# Patient Record
Sex: Female | Born: 1985 | Race: White | Hispanic: No | Marital: Single | Smoking: Current every day smoker
Health system: Southern US, Community
[De-identification: ages and names within clinical notes are randomized; demographics above are authoritative.]

## PROBLEM LIST (undated history)

## (undated) DIAGNOSIS — R519 Headache, unspecified: Secondary | ICD-10-CM

## (undated) DIAGNOSIS — F419 Anxiety disorder, unspecified: Secondary | ICD-10-CM

## (undated) DIAGNOSIS — F319 Bipolar disorder, unspecified: Secondary | ICD-10-CM

## (undated) DIAGNOSIS — R569 Unspecified convulsions: Secondary | ICD-10-CM

## (undated) DIAGNOSIS — F4310 Post-traumatic stress disorder, unspecified: Secondary | ICD-10-CM

## (undated) DIAGNOSIS — M797 Fibromyalgia: Secondary | ICD-10-CM

## (undated) DIAGNOSIS — R51 Headache: Secondary | ICD-10-CM

## (undated) DIAGNOSIS — M4850XA Collapsed vertebra, not elsewhere classified, site unspecified, initial encounter for fracture: Secondary | ICD-10-CM

## (undated) DIAGNOSIS — J45909 Unspecified asthma, uncomplicated: Secondary | ICD-10-CM

## (undated) HISTORY — PX: HAND SURGERY: SHX662

---

## 2008-08-14 ENCOUNTER — Emergency Department: Payer: Self-pay | Admitting: Emergency Medicine

## 2009-01-05 ENCOUNTER — Observation Stay: Payer: Self-pay | Admitting: Obstetrics and Gynecology

## 2009-06-17 ENCOUNTER — Emergency Department: Payer: Self-pay | Admitting: Emergency Medicine

## 2009-07-01 ENCOUNTER — Emergency Department: Payer: Self-pay | Admitting: Emergency Medicine

## 2009-10-16 ENCOUNTER — Emergency Department: Payer: Self-pay | Admitting: Emergency Medicine

## 2011-11-08 ENCOUNTER — Emergency Department: Payer: Self-pay | Admitting: Emergency Medicine

## 2011-11-08 LAB — URINALYSIS, COMPLETE
Nitrite: NEGATIVE
Ph: 5 (ref 4.5–8.0)
Protein: 30
RBC,UR: 28 /HPF (ref 0–5)

## 2011-11-08 LAB — BASIC METABOLIC PANEL
Anion Gap: 9 (ref 7–16)
BUN: 11 mg/dL (ref 7–18)
Chloride: 104 mmol/L (ref 98–107)
Co2: 25 mmol/L (ref 21–32)
Creatinine: 0.69 mg/dL (ref 0.60–1.30)
Glucose: 86 mg/dL (ref 65–99)
Osmolality: 274 (ref 275–301)
Potassium: 3.6 mmol/L (ref 3.5–5.1)

## 2011-11-08 LAB — CBC WITH DIFFERENTIAL/PLATELET
Basophil #: 0 10*3/uL (ref 0.0–0.1)
Basophil %: 0.4 %
Eosinophil #: 0.8 10*3/uL — ABNORMAL HIGH (ref 0.0–0.7)
Eosinophil %: 7.4 %
HGB: 12.1 g/dL (ref 12.0–16.0)
Lymphocyte #: 2.2 10*3/uL (ref 1.0–3.6)
MCH: 27.8 pg (ref 26.0–34.0)
MCHC: 33.4 g/dL (ref 32.0–36.0)
Monocyte #: 0.8 x10 3/mm (ref 0.2–0.9)
Neutrophil %: 63.8 %
Platelet: 251 10*3/uL (ref 150–440)
RDW: 13.7 % (ref 11.5–14.5)

## 2011-11-08 LAB — PREGNANCY, URINE: Pregnancy Test, Urine: NEGATIVE m[IU]/mL

## 2011-11-25 ENCOUNTER — Emergency Department: Payer: Self-pay | Admitting: Emergency Medicine

## 2012-01-07 LAB — CBC WITH DIFFERENTIAL/PLATELET
Basophil #: 0 10*3/uL (ref 0.0–0.1)
Basophil %: 0.5 %
Eosinophil #: 0.5 10*3/uL (ref 0.0–0.7)
HCT: 39.3 % (ref 35.0–47.0)
HGB: 13.2 g/dL (ref 12.0–16.0)
Lymphocyte #: 2.1 10*3/uL (ref 1.0–3.6)
Lymphocyte %: 27.3 %
MCHC: 33.6 g/dL (ref 32.0–36.0)
Monocyte #: 0.5 x10 3/mm (ref 0.2–0.9)
Monocyte %: 7.1 %
Neutrophil #: 4.6 10*3/uL (ref 1.4–6.5)
Neutrophil %: 58.9 %
WBC: 7.8 10*3/uL (ref 3.6–11.0)

## 2012-01-07 LAB — BASIC METABOLIC PANEL
BUN: 12 mg/dL (ref 7–18)
Chloride: 107 mmol/L (ref 98–107)
Glucose: 92 mg/dL (ref 65–99)
Potassium: 3.6 mmol/L (ref 3.5–5.1)

## 2012-01-07 LAB — URINALYSIS, COMPLETE
Bacteria: NONE SEEN
Bilirubin,UR: NEGATIVE
Glucose,UR: NEGATIVE mg/dL (ref 0–75)
Ketone: NEGATIVE
Ph: 5 (ref 4.5–8.0)
RBC,UR: 26 /HPF (ref 0–5)
Specific Gravity: 1.03 (ref 1.003–1.030)
Squamous Epithelial: 32

## 2012-01-07 LAB — DRUG SCREEN, URINE
Amphetamines, Ur Screen: NEGATIVE (ref ?–1000)
Barbiturates, Ur Screen: NEGATIVE (ref ?–200)
Cocaine Metabolite,Ur ~~LOC~~: POSITIVE (ref ?–300)
MDMA (Ecstasy)Ur Screen: NEGATIVE (ref ?–500)
Opiate, Ur Screen: POSITIVE (ref ?–300)
Phencyclidine (PCP) Ur S: NEGATIVE (ref ?–25)
Tricyclic, Ur Screen: NEGATIVE (ref ?–1000)

## 2012-01-08 ENCOUNTER — Inpatient Hospital Stay: Payer: Self-pay | Admitting: Orthopedic Surgery

## 2012-01-08 LAB — PROTIME-INR: Prothrombin Time: 13.2 secs (ref 11.5–14.7)

## 2012-01-13 LAB — CULTURE, BLOOD (SINGLE)

## 2012-01-14 LAB — WOUND CULTURE

## 2012-10-03 DIAGNOSIS — F1910 Other psychoactive substance abuse, uncomplicated: Secondary | ICD-10-CM | POA: Insufficient documentation

## 2013-05-10 DIAGNOSIS — F319 Bipolar disorder, unspecified: Secondary | ICD-10-CM | POA: Insufficient documentation

## 2013-05-10 DIAGNOSIS — F4310 Post-traumatic stress disorder, unspecified: Secondary | ICD-10-CM | POA: Insufficient documentation

## 2013-05-10 DIAGNOSIS — F411 Generalized anxiety disorder: Secondary | ICD-10-CM | POA: Insufficient documentation

## 2013-06-08 DIAGNOSIS — R87611 Atypical squamous cells cannot exclude high grade squamous intraepithelial lesion on cytologic smear of cervix (ASC-H): Secondary | ICD-10-CM | POA: Insufficient documentation
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
## 2014-06-04 NOTE — Consult Note (Signed)
Pt out of room.  Unable to find in hospital or immediately outside.  Discussed with ortho.  Methacillin Sensitive Staph aureus on culture.  Will be discharged on keflex 500mg  po qid x 10 days.  No formal consult obtained. sign off.  Please call with questions.   Electronic Signatures: Shaquandra Galano, Rosalyn GessMichael E (MD)  (Signed on 25-Nov-13 14:57)  Authored  Last Updated: 25-Nov-13 14:57 by Daphnee Preiss, Rosalyn GessMichael E (MD)

## 2014-07-23 DIAGNOSIS — G43709 Chronic migraine without aura, not intractable, without status migrainosus: Secondary | ICD-10-CM | POA: Insufficient documentation

## 2014-07-23 DIAGNOSIS — IMO0002 Reserved for concepts with insufficient information to code with codable children: Secondary | ICD-10-CM | POA: Insufficient documentation

## 2015-02-28 DIAGNOSIS — B182 Chronic viral hepatitis C: Secondary | ICD-10-CM

## 2015-02-28 HISTORY — DX: Chronic viral hepatitis C: B18.2

## 2015-04-17 DIAGNOSIS — M797 Fibromyalgia: Secondary | ICD-10-CM | POA: Insufficient documentation

## 2016-03-23 ENCOUNTER — Encounter (HOSPITAL_COMMUNITY): Payer: Self-pay | Admitting: Family Medicine

## 2016-03-23 ENCOUNTER — Emergency Department (HOSPITAL_COMMUNITY)
Admission: EM | Admit: 2016-03-23 | Discharge: 2016-03-24 | Disposition: A | Discharge status: Other Acute Inpatient Hospital | Payer: Medicaid Other | Attending: Emergency Medicine | Admitting: Emergency Medicine

## 2016-03-23 ENCOUNTER — Ambulatory Visit (HOSPITAL_COMMUNITY)
Admission: AD | Admit: 2016-03-23 | Discharge: 2016-03-23 | Disposition: A | Discharge status: Home / Self Care | Payer: Medicaid Other | Attending: Psychiatry | Admitting: Psychiatry

## 2016-03-23 DIAGNOSIS — F319 Bipolar disorder, unspecified: Secondary | ICD-10-CM | POA: Insufficient documentation

## 2016-03-23 DIAGNOSIS — F1910 Other psychoactive substance abuse, uncomplicated: Secondary | ICD-10-CM | POA: Diagnosis not present

## 2016-03-23 DIAGNOSIS — F17210 Nicotine dependence, cigarettes, uncomplicated: Secondary | ICD-10-CM | POA: Insufficient documentation

## 2016-03-23 DIAGNOSIS — Z046 Encounter for general psychiatric examination, requested by authority: Secondary | ICD-10-CM | POA: Diagnosis present

## 2016-03-23 DIAGNOSIS — F1410 Cocaine abuse, uncomplicated: Secondary | ICD-10-CM | POA: Insufficient documentation

## 2016-03-23 DIAGNOSIS — Z79899 Other long term (current) drug therapy: Secondary | ICD-10-CM | POA: Diagnosis not present

## 2016-03-23 DIAGNOSIS — R45851 Suicidal ideations: Secondary | ICD-10-CM | POA: Diagnosis not present

## 2016-03-23 DIAGNOSIS — F329 Major depressive disorder, single episode, unspecified: Secondary | ICD-10-CM | POA: Insufficient documentation

## 2016-03-23 HISTORY — DX: Fibromyalgia: M79.7

## 2016-03-23 HISTORY — DX: Collapsed vertebra, not elsewhere classified, site unspecified, initial encounter for fracture: M48.50XA

## 2016-03-23 LAB — COMPREHENSIVE METABOLIC PANEL
ALBUMIN: 4 g/dL (ref 3.5–5.0)
ALK PHOS: 44 U/L (ref 38–126)
ALT: 51 U/L (ref 14–54)
AST: 45 U/L — AB (ref 15–41)
Anion gap: 6 (ref 5–15)
BUN: 12 mg/dL (ref 6–20)
CHLORIDE: 106 mmol/L (ref 101–111)
CO2: 27 mmol/L (ref 22–32)
CREATININE: 0.69 mg/dL (ref 0.44–1.00)
Calcium: 9 mg/dL (ref 8.9–10.3)
GFR calc non Af Amer: >60 mL/min (ref >60)
GLUCOSE: 92 mg/dL (ref 65–99)
Potassium: 3.5 mmol/L (ref 3.5–5.1)
SODIUM: 139 mmol/L (ref 135–145)
Total Bilirubin: 0.2 mg/dL — ABNORMAL LOW (ref 0.3–1.2)
Total Protein: 7.2 g/dL (ref 6.5–8.1)

## 2016-03-23 LAB — CBC
HEMATOCRIT: 35.6 % — AB (ref 36.0–46.0)
HEMOGLOBIN: 11.7 g/dL — AB (ref 12.0–15.0)
MCH: 25.8 pg — AB (ref 26.0–34.0)
MCHC: 32.9 g/dL (ref 30.0–36.0)
MCV: 78.6 fL (ref 78.0–100.0)
Platelets: 248 10*3/uL (ref 150–400)
RBC: 4.53 MIL/uL (ref 3.87–5.11)
RDW: 15.3 % (ref 11.5–15.5)
WBC: 5 10*3/uL (ref 4.0–10.5)

## 2016-03-23 LAB — RAPID URINE DRUG SCREEN, HOSP PERFORMED
AMPHETAMINES: NOT DETECTED
BARBITURATES: NOT DETECTED
Benzodiazepines: POSITIVE — AB
Cocaine: POSITIVE — AB
OPIATES: POSITIVE — AB
TETRAHYDROCANNABINOL: NOT DETECTED

## 2016-03-23 LAB — SALICYLATE LEVEL

## 2016-03-23 LAB — ACETAMINOPHEN LEVEL: Acetaminophen (Tylenol), Serum: <10 ug/mL — ABNORMAL LOW (ref 10–30)

## 2016-03-23 LAB — ETHANOL: Alcohol, Ethyl (B): <5 mg/dL (ref <5)

## 2016-03-23 LAB — POC URINE PREG, ED: Preg Test, Ur: NEGATIVE

## 2016-03-23 NOTE — ED Triage Notes (Signed)
[facility]. Pt is agitated and rude to staff during triage cause she does not like protocols for safety for instance to staff having to walk with back to the assigned room from triage.
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
## 2016-03-24 ENCOUNTER — Inpatient Hospital Stay (HOSPITAL_COMMUNITY)
Admission: EM | Admit: 2016-03-24 | Discharge: 2016-03-30 | DRG: 897 | Disposition: A | Discharge status: Home / Self Care | Payer: Medicaid Other | Source: Intra-hospital | Attending: Psychiatry | Admitting: Psychiatry

## 2016-03-24 ENCOUNTER — Encounter (HOSPITAL_COMMUNITY): Payer: Self-pay | Admitting: *Deleted

## 2016-03-24 DIAGNOSIS — F17210 Nicotine dependence, cigarettes, uncomplicated: Secondary | ICD-10-CM | POA: Diagnosis present

## 2016-03-24 DIAGNOSIS — Z872 Personal history of diseases of the skin and subcutaneous tissue: Secondary | ICD-10-CM

## 2016-03-24 DIAGNOSIS — J45909 Unspecified asthma, uncomplicated: Secondary | ICD-10-CM | POA: Diagnosis present

## 2016-03-24 DIAGNOSIS — F319 Bipolar disorder, unspecified: Secondary | ICD-10-CM | POA: Diagnosis not present

## 2016-03-24 DIAGNOSIS — F411 Generalized anxiety disorder: Secondary | ICD-10-CM | POA: Diagnosis present

## 2016-03-24 DIAGNOSIS — F1490 Cocaine use, unspecified, uncomplicated: Secondary | ICD-10-CM | POA: Diagnosis not present

## 2016-03-24 DIAGNOSIS — F1123 Opioid dependence with withdrawal: Secondary | ICD-10-CM | POA: Diagnosis present

## 2016-03-24 DIAGNOSIS — Z79899 Other long term (current) drug therapy: Secondary | ICD-10-CM

## 2016-03-24 DIAGNOSIS — F1110 Opioid abuse, uncomplicated: Secondary | ICD-10-CM | POA: Diagnosis present

## 2016-03-24 DIAGNOSIS — L309 Dermatitis, unspecified: Secondary | ICD-10-CM | POA: Diagnosis present

## 2016-03-24 DIAGNOSIS — F1124 Opioid dependence with opioid-induced mood disorder: Secondary | ICD-10-CM | POA: Diagnosis present

## 2016-03-24 DIAGNOSIS — F329 Major depressive disorder, single episode, unspecified: Secondary | ICD-10-CM | POA: Insufficient documentation

## 2016-03-24 DIAGNOSIS — Z8742 Personal history of other diseases of the female genital tract: Secondary | ICD-10-CM | POA: Diagnosis not present

## 2016-03-24 DIAGNOSIS — F32A Depression, unspecified: Secondary | ICD-10-CM | POA: Insufficient documentation

## 2016-03-24 DIAGNOSIS — Z8759 Personal history of other complications of pregnancy, childbirth and the puerperium: Secondary | ICD-10-CM | POA: Insufficient documentation

## 2016-03-24 DIAGNOSIS — B182 Chronic viral hepatitis C: Secondary | ICD-10-CM | POA: Diagnosis not present

## 2016-03-24 DIAGNOSIS — F3130 Bipolar disorder, current episode depressed, mild or moderate severity, unspecified: Secondary | ICD-10-CM | POA: Diagnosis not present

## 2016-03-24 DIAGNOSIS — F1994 Other psychoactive substance use, unspecified with psychoactive substance-induced mood disorder: Secondary | ICD-10-CM | POA: Diagnosis not present

## 2016-03-24 DIAGNOSIS — F1390 Sedative, hypnotic, or anxiolytic use, unspecified, uncomplicated: Secondary | ICD-10-CM | POA: Diagnosis not present

## 2016-03-24 DIAGNOSIS — F419 Anxiety disorder, unspecified: Secondary | ICD-10-CM | POA: Insufficient documentation

## 2016-03-24 DIAGNOSIS — M797 Fibromyalgia: Secondary | ICD-10-CM | POA: Diagnosis not present

## 2016-03-24 DIAGNOSIS — Z9889 Other specified postprocedural states: Secondary | ICD-10-CM | POA: Diagnosis not present

## 2016-03-24 DIAGNOSIS — F4310 Post-traumatic stress disorder, unspecified: Secondary | ICD-10-CM | POA: Diagnosis not present

## 2016-03-24 HISTORY — DX: Headache: R51

## 2016-03-24 HISTORY — DX: Headache, unspecified: R51.9

## 2016-03-24 HISTORY — DX: Anxiety disorder, unspecified: F41.9

## 2016-03-24 HISTORY — DX: Unspecified convulsions: R56.9

## 2016-03-24 HISTORY — DX: Bipolar disorder, unspecified: F31.9

## 2016-03-24 LAB — LIPID PANEL
CHOLESTEROL: 164 mg/dL (ref 0–200)
HDL: 47 mg/dL (ref >40)
LDL Cholesterol: 97 mg/dL (ref 0–99)
TRIGLYCERIDES: 99 mg/dL (ref <150)
Total CHOL/HDL Ratio: 3.5 RATIO
VLDL: 20 mg/dL (ref 0–40)

## 2016-03-24 LAB — TSH: TSH: 4.487 u[IU]/mL (ref 0.350–4.500)

## 2016-03-24 MED ORDER — CHLORDIAZEPOXIDE HCL 25 MG PO CAPS
25.0000 mg | ORAL_CAPSULE | Freq: Every day | ORAL | Status: AC
  Administered 2016-03-28: 25 mg via ORAL
  Filled 2016-03-24: qty 1

## 2016-03-24 MED ORDER — DICYCLOMINE HCL 20 MG PO TABS
20.0000 mg | ORAL_TABLET | Freq: Four times a day (QID) | ORAL | Status: AC | PRN
  Administered 2016-03-25: 20 mg via ORAL
  Filled 2016-03-24: qty 1

## 2016-03-24 MED ORDER — ALUM & MAG HYDROXIDE-SIMETH 200-200-20 MG/5ML PO SUSP: 30.0000 mL | ORAL | Status: DC | PRN

## 2016-03-24 MED ORDER — CHLORDIAZEPOXIDE HCL 25 MG PO CAPS
25.0000 mg | ORAL_CAPSULE | Freq: Four times a day (QID) | ORAL | Status: AC | PRN
  Filled 2016-03-24: qty 1

## 2016-03-24 MED ORDER — ALBUTEROL SULFATE HFA 108 (90 BASE) MCG/ACT IN AERS
1.0000 | INHALATION_SPRAY | Freq: Four times a day (QID) | RESPIRATORY_TRACT | Status: DC | PRN
  Administered 2016-03-25 – 2016-03-26 (×2): 2 via RESPIRATORY_TRACT
  Filled 2016-03-24: qty 6.7

## 2016-03-24 MED ORDER — VITAMIN B-1 100 MG PO TABS
100.0000 mg | ORAL_TABLET | Freq: Every day | ORAL | Status: DC
Start: 2016-03-25 — End: 2016-03-30
  Administered 2016-03-25 – 2016-03-30 (×6): 100 mg via ORAL
  Filled 2016-03-24 (×9): qty 1

## 2016-03-24 MED ORDER — ALBUTEROL SULFATE HFA 108 (90 BASE) MCG/ACT IN AERS
2.0000 | INHALATION_SPRAY | Freq: Once | RESPIRATORY_TRACT | Status: AC
  Administered 2016-03-24: 2 via RESPIRATORY_TRACT
  Filled 2016-03-24: qty 6.7

## 2016-03-24 MED ORDER — CHLORDIAZEPOXIDE HCL 25 MG PO CAPS
25.0000 mg | ORAL_CAPSULE | ORAL | Status: AC
  Administered 2016-03-27 (×2): 25 mg via ORAL
  Filled 2016-03-24 (×2): qty 1

## 2016-03-24 MED ORDER — QUETIAPINE FUMARATE 100 MG PO TABS
100.0000 mg | ORAL_TABLET | Freq: Every evening | ORAL | Status: DC | PRN
  Administered 2016-03-24 – 2016-03-25 (×2): 100 mg via ORAL
  Filled 2016-03-24 (×9): qty 1

## 2016-03-24 MED ORDER — CLONIDINE HCL 0.1 MG PO TABS
0.1000 mg | ORAL_TABLET | Freq: Every day | ORAL | Status: AC
  Administered 2016-03-28 – 2016-03-29 (×2): 0.1 mg via ORAL
  Filled 2016-03-24 (×3): qty 1

## 2016-03-24 MED ORDER — CHLORDIAZEPOXIDE HCL 25 MG PO CAPS
25.0000 mg | ORAL_CAPSULE | Freq: Four times a day (QID) | ORAL | Status: AC
  Administered 2016-03-24 – 2016-03-25 (×6): 25 mg via ORAL
  Filled 2016-03-24 (×5): qty 1

## 2016-03-24 MED ORDER — CLONIDINE HCL 0.1 MG PO TABS
0.1000 mg | ORAL_TABLET | ORAL | Status: AC
  Administered 2016-03-26 – 2016-03-27 (×4): 0.1 mg via ORAL
  Filled 2016-03-24 (×4): qty 1

## 2016-03-24 MED ORDER — CHLORDIAZEPOXIDE HCL 25 MG PO CAPS
25.0000 mg | ORAL_CAPSULE | Freq: Three times a day (TID) | ORAL | Status: AC
  Administered 2016-03-26 (×3): 25 mg via ORAL
  Filled 2016-03-24 (×3): qty 1

## 2016-03-24 MED ORDER — ACETAMINOPHEN 325 MG PO TABS
650.0000 mg | ORAL_TABLET | Freq: Four times a day (QID) | ORAL | Status: DC | PRN
  Administered 2016-03-25 – 2016-03-30 (×8): 650 mg via ORAL
  Filled 2016-03-24 (×9): qty 2

## 2016-03-24 MED ORDER — GABAPENTIN 300 MG PO CAPS
300.0000 mg | ORAL_CAPSULE | Freq: Three times a day (TID) | ORAL | Status: DC
  Administered 2016-03-24 – 2016-03-26 (×8): 300 mg via ORAL
  Filled 2016-03-24 (×13): qty 1

## 2016-03-24 MED ORDER — DULOXETINE HCL 20 MG PO CPEP
20.0000 mg | ORAL_CAPSULE | Freq: Two times a day (BID) | ORAL | Status: DC
  Administered 2016-03-24 – 2016-03-26 (×5): 20 mg via ORAL
  Filled 2016-03-24 (×9): qty 1

## 2016-03-24 MED ORDER — LOPERAMIDE HCL 2 MG PO CAPS: 2.0000 mg | ORAL_CAPSULE | ORAL | Status: AC | PRN

## 2016-03-24 MED ORDER — LORAZEPAM 0.5 MG PO TABS: 0.5000 mg | ORAL_TABLET | Freq: Three times a day (TID) | ORAL | Status: DC

## 2016-03-24 MED ORDER — HYDROXYZINE HCL 25 MG PO TABS
25.0000 mg | ORAL_TABLET | Freq: Four times a day (QID) | ORAL | Status: DC | PRN
  Administered 2016-03-24 – 2016-03-26 (×4): 25 mg via ORAL
  Filled 2016-03-24 (×4): qty 1

## 2016-03-24 MED ORDER — METHOCARBAMOL 500 MG PO TABS
500.0000 mg | ORAL_TABLET | Freq: Three times a day (TID) | ORAL | Status: AC | PRN
Start: 2016-03-24 — End: 2016-03-29
  Administered 2016-03-24 – 2016-03-28 (×8): 500 mg via ORAL
  Filled 2016-03-24 (×9): qty 1

## 2016-03-24 MED ORDER — ADULT MULTIVITAMIN W/MINERALS CH
1.0000 | ORAL_TABLET | Freq: Every day | ORAL | Status: DC
  Administered 2016-03-24 – 2016-03-30 (×7): 1 via ORAL
  Filled 2016-03-24 (×9): qty 1

## 2016-03-24 MED ORDER — ONDANSETRON 4 MG PO TBDP
4.0000 mg | ORAL_TABLET | Freq: Four times a day (QID) | ORAL | Status: AC | PRN
  Administered 2016-03-25: 4 mg via ORAL
  Filled 2016-03-24: qty 1

## 2016-03-24 MED ORDER — NAPROXEN 500 MG PO TABS
500.0000 mg | ORAL_TABLET | Freq: Two times a day (BID) | ORAL | Status: DC | PRN
  Administered 2016-03-24 – 2016-03-27 (×5): 500 mg via ORAL
  Filled 2016-03-24 (×5): qty 1

## 2016-03-24 MED ORDER — NICOTINE 21 MG/24HR TD PT24
21.0000 mg | MEDICATED_PATCH | Freq: Every day | TRANSDERMAL | Status: DC
  Administered 2016-03-24 – 2016-03-26 (×2): 21 mg via TRANSDERMAL
  Filled 2016-03-24 (×5): qty 1

## 2016-03-24 MED ORDER — CLONIDINE HCL 0.1 MG PO TABS
0.1000 mg | ORAL_TABLET | Freq: Four times a day (QID) | ORAL | Status: AC
  Administered 2016-03-24 – 2016-03-25 (×6): 0.1 mg via ORAL
  Filled 2016-03-24 (×8): qty 1

## 2016-03-24 MED ORDER — MAGNESIUM HYDROXIDE 400 MG/5ML PO SUSP: 30.0000 mL | Freq: Every day | ORAL | Status: DC | PRN

## 2016-03-24 NOTE — Progress Notes (Signed)
DAR NOTE: Pt present with flat affect and depressed mood in the unit. Pt has been isolating herself and has been bed most of the time. Pt complained of withdraw symptoms, took all her meds as scheduled. As per self inventory, pt had a poor night sleep, fair appetite, low energy, and poorconcentration. Pt rate depression at 5, hopeless ness at 10, and anxiety at 10. Pt's safety ensured with 15 minute and environmental checks. Pt currently denies SI/HI and A/V hallucinations. Pt verbally agrees to seek staff if SI/HI or A/VH occurs and to consult with staff before acting on these thoughts. Will continue POC.

## 2016-03-24 NOTE — Progress Notes (Signed)
LCSW attempted to complete PSA with patient. Patient reports not feeling well and LCSW will re-attempt this afternoon as today is patient's first day on unit.  Reviewed chart and care everywhere regarding history of patient. [facility]    Will follow up with full PSA and treatment plan once patient able to engage.  Deretha EmoryHannah Ayiana Winslett LCSW, MSW Clinical Social Work: Optician, dispensingystem [employer]
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
## 2016-03-24 NOTE — ED Notes (Signed)
Patient is being escorted with [facility] to [facility], room 303-1. ALBUTEROL [facility]. [facility].
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
## 2016-03-24 NOTE — Tx Team (Signed)
Interdisciplinary Treatment and Diagnostic Plan Update  03/25/2016 Time of Session: 9:30AM Kathy [name] MRN: [id number]  Principal Diagnosis: Opiate abuse, continuous  Secondary Diagnoses: Principal Problem:   Opiate abuse, continuous Active Problems:   Substance induced mood disorder (HCC)   Current Medications:  Current Facility-Administered Medications  Medication Dose Route Frequency Provider Last Rate Last Dose  . acetaminophen (TYLENOL) tablet 650 mg  650 mg Oral Q6H PRN Kerry Hough, PA-C   650 mg at 03/25/16 0759  . albuterol (PROVENTIL HFA;VENTOLIN HFA) 108 (90 Base) MCG/ACT inhaler 1-2 puff  1-2 puff Inhalation Q6H PRN Kerry Hough, PA-C   2 puff at 03/25/16 [phone]  . alum & mag hydroxide-simeth (MAALOX/MYLANTA) 200-200-20 MG/5ML suspension 30 mL  30 mL Oral Q4H PRN Kerry Hough, PA-C      . chlordiazePOXIDE (LIBRIUM) capsule 25 mg  25 mg Oral Q6H PRN Burnard Leigh, MD      . chlordiazePOXIDE (LIBRIUM) capsule 25 mg  25 mg Oral QID Burnard Leigh, MD   25 mg at 03/25/16 0755   Followed by  . [START ON 03/26/2016] chlordiazePOXIDE (LIBRIUM) capsule 25 mg  25 mg Oral TID Burnard Leigh, MD       Followed by  . [START ON 03/27/2016] chlordiazePOXIDE (LIBRIUM) capsule 25 mg  25 mg Oral BH-qamhs Burnard Leigh, MD       Followed by  . [START ON 03/28/2016] chlordiazePOXIDE (LIBRIUM) capsule 25 mg  25 mg Oral Daily Burnard Leigh, MD      . cloNIDine (CATAPRES) tablet 0.1 mg  0.1 mg Oral QID Kerry Hough, PA-C   0.1 mg at 03/25/16 0755   Followed by  . [START ON 03/26/2016] cloNIDine (CATAPRES) tablet 0.1 mg  0.1 mg Oral BH-qamhs Spencer E Simon, PA-C       Followed by  . [START ON 03/28/2016] cloNIDine (CATAPRES) tablet 0.1 mg  0.1 mg Oral QAC breakfast Kerry Hough, PA-C      . dicyclomine (BENTYL) tablet 20 mg  20 mg Oral Q6H PRN Kerry Hough, PA-C   20 mg at 03/25/16 0759  . DULoxetine (CYMBALTA) DR capsule 20 mg  20 mg Oral BID Kerry Hough, PA-C   20 mg at 03/25/16 0753  . gabapentin (NEURONTIN) capsule 300 mg  300 mg Oral TID Kerry Hough, PA-C   300 mg at 03/25/16 0753  . hydrOXYzine (ATARAX/VISTARIL) tablet 25 mg  25 mg Oral Q6H PRN Kerry Hough, PA-C   25 mg at 03/24/16 1219  . loperamide (IMODIUM) capsule 2-4 mg  2-4 mg Oral PRN Kerry Hough, PA-C      . magnesium hydroxide (MILK OF MAGNESIA) suspension 30 mL  30 mL Oral Daily PRN Kerry Hough, PA-C      . methocarbamol (ROBAXIN) tablet 500 mg  500 mg Oral Q8H PRN Kerry Hough, PA-C   500 mg at 03/24/16 1191  . multivitamin with minerals tablet 1 tablet  1 tablet Oral Daily Burnard Leigh, MD   1 tablet at 03/25/16 4782  . naproxen (NAPROSYN) tablet 500 mg  500 mg Oral BID PRN Kerry Hough, PA-C   500 mg at 03/24/16 9562  . nicotine (NICODERM CQ - dosed in mg/24 hours) patch 21 mg  21 mg Transdermal Daily Kerry Hough, PA-C   21 mg at 03/24/16 0810  . ondansetron (ZOFRAN-ODT) disintegrating tablet 4 mg  4 mg Oral Q6H PRN Kerry Hough,  PA-C   4 mg at 03/25/16 0759  . QUEtiapine (SEROQUEL) tablet 100 mg  100 mg Oral QHS,MR X 1 Kerry Hough, PA-C   100 mg at 03/24/16 2158  . thiamine (VITAMIN B-1) tablet 100 mg  100 mg Oral Daily Burnard Leigh, MD   100 mg at 03/25/16 1610   PTA Medications: Prescriptions Prior to Admission  Medication Sig Dispense Refill Last Dose  . albuterol (PROVENTIL HFA;VENTOLIN HFA) 108 (90 Base) MCG/ACT inhaler Inhale 1-2 puffs into the lungs every 4 (four) hours as needed.   03/24/2016 at Unknown time  . clonazePAM (KLONOPIN) 1 MG tablet Take 1 tablet by mouth 3 (three) times daily.   Past Week at Unknown time  . gabapentin (NEURONTIN) 300 MG capsule Take 1 capsule by mouth 2 (two) times daily.   Past Week at Unknown time  . metroNIDAZOLE (FLAGYL) 500 MG tablet Take 500 mg by mouth 2 (two) times daily.   Past Week at Unknown time    Patient Stressors: Financial difficulties Health problems Medication  change or noncompliance Substance abuse  Patient Strengths: Average or above average intelligence Capable of independent living General fund of knowledge Motivation for treatment/growth Supportive family/friends  Treatment Modalities: Medication Management, Group therapy, Case management,  1 to 1 session with clinician, Psychoeducation, Recreational therapy.   Physician Treatment Plan for Primary Diagnosis: Opiate abuse, continuous Long Term Goal(s): Improvement in symptoms so as ready for discharge Improvement in symptoms so as ready for discharge   Short Term Goals: Ability to identify changes in lifestyle to reduce recurrence of condition will improve Ability to demonstrate self-control will improve Ability to identify and develop effective coping behaviors will improve Compliance with prescribed medications will improve Ability to identify triggers associated with substance abuse/mental health issues will improve Ability to identify changes in lifestyle to reduce recurrence of condition will improve Ability to identify and develop effective coping behaviors will improve Ability to maintain clinical measurements within normal limits will improve Compliance with prescribed medications will improve Ability to identify triggers associated with substance abuse/mental health issues will improve  Medication Management: Evaluate patient's response, side effects, and tolerance of medication regimen.  Therapeutic Interventions: 1 to 1 sessions, Unit Group sessions and Medication administration.  Evaluation of Outcomes: Progressing  Physician Treatment Plan for Secondary Diagnosis: Principal Problem:   Opiate abuse, continuous Active Problems:   Substance induced mood disorder (HCC)  Long Term Goal(s): Improvement in symptoms so as ready for discharge Improvement in symptoms so as ready for discharge   Short Term Goals: Ability to identify changes in lifestyle to reduce recurrence  of condition will improve Ability to demonstrate self-control will improve Ability to identify and develop effective coping behaviors will improve Compliance with prescribed medications will improve Ability to identify triggers associated with substance abuse/mental health issues will improve Ability to identify changes in lifestyle to reduce recurrence of condition will improve Ability to identify and develop effective coping behaviors will improve Ability to maintain clinical measurements within normal limits will improve Compliance with prescribed medications will improve Ability to identify triggers associated with substance abuse/mental health issues will improve     Medication Management: Evaluate patient's response, side effects, and tolerance of medication regimen.  Therapeutic Interventions: 1 to 1 sessions, Unit Group sessions and Medication administration.  Evaluation of Outcomes: Progressing   RN Treatment Plan for Primary Diagnosis: Opiate abuse, continuous Long Term Goal(s): Knowledge of disease and therapeutic regimen to maintain health will improve  Short Term Goals:  Ability to remain free from injury will improve, Ability to verbalize feelings will improve and Ability to disclose and discuss suicidal ideas  Medication Management: RN will administer medications as ordered by provider, will assess and evaluate patient's response and provide education to patient for prescribed medication. RN will report any adverse and/or side effects to prescribing provider.  Therapeutic Interventions: 1 on 1 counseling sessions, Psychoeducation, Medication administration, Evaluate responses to treatment, Monitor vital signs and CBGs as ordered, Perform/monitor CIWA, COWS, AIMS and Fall Risk screenings as ordered, Perform wound care treatments as ordered.  Evaluation of Outcomes: Progressing   LCSW Treatment Plan for Primary Diagnosis: Opiate abuse, continuous Long Term Goal(s): Safe  transition to appropriate next level of care at discharge, Engage patient in therapeutic group addressing interpersonal concerns.  Short Term Goals: Engage patient in aftercare planning with referrals and resources, Facilitate patient progression through stages of change regarding substance use diagnoses and concerns and Identify triggers associated with mental health/substance abuse issues  Therapeutic Interventions: Assess for all discharge needs, 1 to 1 time with Social worker, Explore available resources and support systems, Assess for adequacy in community support network, Educate family and significant other(s) on suicide prevention, Complete Psychosocial Assessment, Interpersonal group therapy.  Evaluation of Outcomes: Progressing   Progress in Treatment: Attending groups: No. Participating in groups: No.New to unit. Continuing to assess.  Taking medication as prescribed: Yes. Toleration medication: Yes. Family/Significant other contact made: No, will contact:  family member if patient consents.  Patient understands diagnosis: Yes. Discussing patient identified problems/goals with staff: Yes. Medical problems stabilized or resolved: Yes. Denies suicidal/homicidal ideation: Yes. Issues/concerns per patient self-inventory: No. Other: n/a  New problem(s) identified: No, Describe:  n/a  New Short Term/Long Term Goal(s): medication stabilization; detox; development of comprehensive mental wellness/sobriety plan.   Discharge Plan or Barriers: CSW assessing for appropriate referrals. No current outpatient providers. Stopped taking psychiatric medications in Dec 2017. First hospitalization  Reason for Continuation of Hospitalization: Anxiety Depression Mania Medication stabilization Withdrawal symptoms  Estimated Length of Stay: 3-5 days   Attendees: Patient: 03/25/2016 8:36 AM  Physician:  03/25/2016 8:36 AM  Nursing:  03/25/2016 8:36 AM  RN Care Manager: Onnie BoerJennifer Clark CM 03/25/2016  8:36 AM  Social Worker: Trula SladeHeather Smart, LCSW 03/25/2016 8:36 AM  Recreational Therapist:  03/25/2016 8:36 AM  Other: Gray BernhardtMay Augustin NP; Armandina Stammergnes Nwoko NP 03/25/2016 8:36 AM  Other:  03/25/2016 8:36 AM  Other: 03/25/2016 8:36 AM    Scribe for Treatment Team: Ledell PeoplesHeather N Smart, LCSW 03/25/2016 8:36 AM
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
## 2016-03-24 NOTE — Tx Team (Signed)
Initial Treatment Plan 03/24/2016 5:34 AM Kathy GallantNadia [name] ZOX:[id number]    PATIENT STRESSORS: Financial difficulties Health problems Medication change or noncompliance Substance abuse   PATIENT STRENGTHS: Average or above average intelligence Capable of independent living General fund of knowledge Motivation for treatment/growth Supportive family/friends   PATIENT IDENTIFIED PROBLEMS: Anxiety  Substance abuse  Can't afford meds  Grief and loss    "I need to learn some coping skills instead of using drugs to cope"  "I probably need to go to a program after I detox, hopefully a 30 day program, so I'm no away from my kids so long"         DISCHARGE CRITERIA:  Ability to meet basic life and health needs Improved stabilization in mood, thinking, and/or behavior Motivation to continue treatment in a less acute level of care Need for constant or close observation no longer present Verbal commitment to aftercare and medication compliance Withdrawal symptoms are absent or subacute and managed without 24-hour nursing intervention  PRELIMINARY DISCHARGE PLAN: Attend aftercare/continuing care group Attend 12-step recovery group Outpatient therapy Placement in alternative living arrangements  PATIENT/FAMILY INVOLVEMENT: This treatment plan has been presented to and reviewed with the patient, Kathy [name], and/or family member.  The patient and family have been given the opportunity to ask questions and make suggestions.  Charlott HollerSpeagle, Cord Wilczynski Church, RN 03/24/2016, 5:34 AM

## 2016-03-24 NOTE — Progress Notes (Addendum)
Patient ID: Kathy [name], female   DOB: January 30, 1986, 31 y.o.   MRN: [id number]   Pt currently presents with a pained affect and anxious behavior. Pt reports to writer that their goal is to "feel better." Pt states "I just don't feel good al over my body." Pt reports good sleep with current medication regimen. Pt presents with some confusion as she thinks it is breakfast time at 2200. Displays signs and symptoms of withdrawal including mental confusion, cold/hot chills, agitation and sedation.   Pt provided with medications per providers orders. Pt's labs and vitals were monitored throughout the night. Pt given a 1:1 about emotional and mental status. Pt supported and encouraged to express concerns and questions. Pt educated on medications and substance withdrawal plan of care. Neuro assessment completed, oriented x 4.   Pt's safety ensured with 15 minute and environmental checks. Pt currently denies SI/HI and A/V hallucinations. Pt verbally agrees to seek staff if SI/HI or A/VH occurs and to consult with staff before acting on any harmful thoughts. Will continue POC.

## 2016-03-24 NOTE — ED Provider Notes (Signed)
[facility]-EMERGENCY DEPT Provider Note   CSN: [id number] Arrival date & time: 03/23/16  2136     History   Chief Complaint Chief Complaint  Patient presents with  . Psychiatric Evaluation    HPI Kathy Gallantadia Ketron is a 31 y.o. female.  HPI   Pt with hx bipolar disorder, polysubstance abuse presents for medical clearance for behavioral health.  She has already been assessed by behavioral health and has a bed ready.  She reports physically she has been doing well with exception of recently diagnosed bronchitis.  Her mother took her inhaler home with her.  Has been seen by her PCP for this.  Denies any CP, SOB, fevers, hemoptysis.  No other medical concerns at this time.    Past Medical History:  Diagnosis Date  . Compression fracture of spine (HCC)   . Fibromyalgia     There are no active problems to display for this patient.   Past Surgical History:  Procedure Laterality Date  . HAND SURGERY Right     OB History    No data available       Home Medications    Prior to Admission medications   Medication Sig Start Date End Date Taking? Authorizing Provider  albuterol (PROVENTIL HFA;VENTOLIN HFA) 108 (90 Base) MCG/ACT inhaler Inhale 1-2 puffs into the lungs every 4 (four) hours as needed. 01/16/14  Yes Historical Provider, MD  clonazePAM (KLONOPIN) 1 MG tablet Take 1 tablet by mouth 3 (three) times daily.   Yes Historical Provider, MD  gabapentin (NEURONTIN) 300 MG capsule Take 1 capsule by mouth 2 (two) times daily. 02/26/16  Yes Historical Provider, MD  metroNIDAZOLE (FLAGYL) 500 MG tablet Take 500 mg by mouth 2 (two) times daily.   Yes Historical Provider, MD    Family History History reviewed. No pertinent family history.  Social History Social History  Substance Use Topics  . Smoking status: Current Every Day Smoker    Packs/day: 1.00    Types: Cigarettes  . Smokeless tobacco: Never Used  . Alcohol use No     Allergies   Patient has no known  allergies.   Review of Systems Review of Systems  Constitutional: Negative for fever.  HENT: Negative for trouble swallowing.   Respiratory: Positive for cough. Negative for shortness of breath.   Cardiovascular: Negative for chest pain.  Gastrointestinal: Negative for abdominal pain, diarrhea, nausea and vomiting.  Genitourinary: Negative for dysuria, frequency, urgency and vaginal discharge.     Physical Exam Updated Vital Signs BP 110/72 (BP Location: Left Arm)   Pulse 77   Temp 97.5 F (36.4 C) (Oral)   Resp 20   Ht 5\' 4"  (1.626 m)   Wt 63.5 kg   SpO2 96%   BMI 24.03 kg/m   Physical Exam  Constitutional: She appears well-developed and well-nourished. No distress.  HENT:  Head: Normocephalic and atraumatic.  Neck: Neck supple.  Cardiovascular: Normal rate and regular rhythm.   Pulmonary/Chest: Effort normal. No respiratory distress. She has wheezes. She has no rales.  Abdominal: Soft. She exhibits no distension. There is no tenderness. There is no rebound and no guarding.  Neurological: She is alert.  Skin: She is not diaphoretic.  Nursing note and vitals reviewed.    ED Treatments / Results  Labs (all labs ordered are listed, but only abnormal results are displayed) Labs Reviewed  COMPREHENSIVE METABOLIC PANEL - Abnormal; Notable for the following:       Result Value   AST 45 (*)  Total Bilirubin 0.2 (*)    All other components within normal limits  ACETAMINOPHEN LEVEL - Abnormal; Notable for the following:    Acetaminophen (Tylenol), Serum <10 (*)    All other components within normal limits  CBC - Abnormal; Notable for the following:    Hemoglobin 11.7 (*)    HCT 35.6 (*)    MCH 25.8 (*)    All other components within normal limits  RAPID URINE DRUG SCREEN, HOSP PERFORMED - Abnormal; Notable for the following:    Opiates POSITIVE (*)    Cocaine POSITIVE (*)    Benzodiazepines POSITIVE (*)    All other components within normal limits  ETHANOL   SALICYLATE LEVEL  POC URINE PREG, ED    EKG  EKG Interpretation None       Radiology No results found.  Procedures Procedures (including critical care time)  Medications Ordered in ED Medications  albuterol (PROVENTIL HFA;VENTOLIN HFA) 108 (90 Base) MCG/ACT inhaler 2 puff (not administered)     Initial Impression / Assessment and Plan / ED Course  I have reviewed the triage vital signs and the nursing notes.  Pertinent labs & imaging results that were available during my care of the patient were reviewed by me and considered in my medical decision making (see chart for details).     Afebrile, nontoxic patient with bipolar mania and polysubstance abuse.  Inhaler given for wheezing.  Medically cleared.  Transferred to [facility], Dr Jama Flavors accepting.   Final Clinical Impressions(s) / ED Diagnoses   Final diagnoses:  Bipolar affective disorder, remission status unspecified (HCC)  Polysubstance abuse    New Prescriptions New Prescriptions   No medications on file     [facility], PA-C 03/24/16 0224    Zadie Rhine, MD 03/24/16 [phone]

## 2016-03-24 NOTE — ED Notes (Signed)
Gave report to Erskine SquibbJane, Charity fundraiserN at [facility].

## 2016-03-24 NOTE — ED Notes (Signed)
Notified [facility] for transportation to [facility].  

## 2016-03-24 NOTE — H&P (Signed)
Behavioral Health Medical Screening Exam  Kathy [name] is an 31 y.o. female. [facility], with c/o of polysubstance abuse in setting of depression with SI.  Total Time spent with patient: 15 minutes  Psychiatric Specialty Exam: Physical Exam  Constitutional: She is oriented to person, place, and time. She appears well-developed and well-nourished.  HENT:  Head: Normocephalic.  Eyes: Pupils are equal, round, and reactive to light.  Respiratory: Effort normal and breath sounds normal.  Neurological: She is alert and oriented to person, place, and time. No cranial nerve deficit.  Skin: Skin is warm and dry.  Psychiatric: Her mood appears anxious. Her speech is rapid and/or pressured. She is agitated. Cognition and memory are impaired. She expresses impulsivity. She exhibits a depressed mood. She expresses suicidal ideation. She expresses suicidal plans.    Review of Systems  Psychiatric/Behavioral: Positive for depression, substance abuse and suicidal ideas.  All other systems reviewed and are negative.   There were no vitals taken for this visit.There is no height or weight on file to calculate BMI.  General Appearance: Disheveled  Eye Contact:  Fair  Speech:  Pressured  Volume:  Increased  Mood:  Anxious  Affect:  Congruent  Thought Process:  Goal Directed  Orientation:  Full (Time, Place, and Person)  Thought Content:  Negative  Suicidal Thoughts:  Yes.  without intent/plan  Homicidal Thoughts:  No  Memory:  Immediate;   Fair  Judgement:  Impaired  Insight:  Lacking  Psychomotor Activity:  Increased  Concentration: Concentration: Fair  Recall:  [employer]Fair  Fund of Knowledge:Fair  Language: Negative  Akathisia:  Negative  Handed:  Right  AIMS (if indicated):     Assets:  Desire for Improvement  Sleep:       Musculoskeletal: Strength & Muscle Tone: within normal limits Gait & Station: normal Patient leans: N/A  There were no vitals taken for this  visit.  Recommendations:  Based on my evaluation the patient appears to have an emergency medical condition for which I recommend the patient be transferred to the emergency department for further evaluation.  Uri Covey E, PA-C 03/24/2016, 2:31 AM

## 2016-03-24 NOTE — [facility] Group Notes (Signed)
[facility] LCSW Group Therapy  03/24/2016 3:46 PM  Type of Therapy:  Group Therapy  Participation Level:  Did Not Attend-pt invited. Chose to rest in room.   Summary of Progress/Problems: Emotion Regulation: This group focused on both positive and negative emotion identification and allowed group members to process ways to identify feelings, regulate negative emotions, and find healthy ways to manage internal/external emotions. Group members were asked to reflect on a time when their reaction to an emotion led to a negative outcome and explored how alternative responses using emotion regulation would have benefited them. Group members were also asked to discuss a time when emotion regulation was utilized when a negative emotion was experienced.   Zain Bingman N Smart LCSW 03/24/2016, 3:46 PM

## 2016-03-25 LAB — HEMOGLOBIN A1C
Hgb A1c MFr Bld: 5.2 % (ref 4.8–5.6)
Mean Plasma Glucose: 103 mg/dL

## 2016-03-25 LAB — PROLACTIN: PROLACTIN: 54.1 ng/mL — AB (ref 4.8–23.3)

## 2016-03-25 NOTE — Progress Notes (Signed)
[facility] MD Progress Note  03/25/2016 8:50 AM Kathy [name]  MRN:  [id number] Subjective:  Spent time with patient discussing her withdrawal symptoms.  She appears to be medically stable and improving from yesterday, although still struggling with constitutional symptoms of withdrawal as below in ROS.  She has received scheduled doses of librium but not required PRN.  She has received all but one dose of scheduled clonidine - she had low systolic BP so last evenings dose was held.  Spent time this AM discussing her diagnosis of bipolar disorder and her perception of this. She is unsure of whether this diagnosis of bipolar disorder is acurate given that she has been on illicit substances for much of her life since age 79.  She is open to discussing further but frankly feels tired and fatigued so prefers to rest this morning.  She denies any active Si, Hi, AVH.  She has had some head "foggyness" and headaches but does not appear delirious or confused at this time.   Principal Problem: Opiate abuse, continuous Diagnosis:   Patient Active Problem List   Diagnosis Date Noted  . Substance induced mood disorder (HCC) [F19.94] 03/24/2016  . History of 3 spontaneous abortions [Z87.42] 03/24/2016  . Eczema [L30.9] 03/24/2016  . Depression [F32.9] 03/24/2016  . Asthma without status asthmaticus without complication [J45.909] 03/24/2016  . Anxiety [F41.9] 03/24/2016  . Opiate abuse, continuous [F11.10] 03/24/2016  . Fibromyalgia [M79.7] 04/17/2015  . Chronic hepatitis C virus infection (HCC) [B18.2] 02/28/2015  . Chronic migraine [G43.709] 07/23/2014  . Bipolar disorder (HCC) [F31.9] 05/10/2013  . PTSD (post-traumatic stress disorder) [F43.10] 05/10/2013  . Substance abuse [F19.10] 10/03/2012   Total Time spent with patient: 20 minutes  Past Psychiatric History: per HPI  Past Medical History:  Past Medical History:  Diagnosis Date  . Anxiety   . Bipolar disorder (HCC)   . Compression fracture of spine  (HCC)   . Fibromyalgia   . Headache   . Seizures (HCC)    Related to benzo withdrawal    Past Surgical History:  Procedure Laterality Date  . HAND SURGERY Right    Family History: History reviewed. No pertinent family history. Family Psychiatric  History: see H&P   Social History:  History  Alcohol Use No     History  Drug Use  . Types: Cocaine, Benzodiazepines, Heroin, Hydrocodone    Comment: Opiods, last used: Last night 3am     Social History   Social History  . Marital status: Single    Spouse name: N/A  . Number of children: N/A  . Years of education: N/A   Social History Main Topics  . Smoking status: Current Every Day Smoker    Packs/day: 1.00    Types: Cigarettes  . Smokeless tobacco: Never Used     Comment: Pt does not want to stop smoking at this time  . Alcohol use No  . Drug use: Yes    Types: Cocaine, Benzodiazepines, Heroin, Hydrocodone     Comment: Opiods, last used: Last night 3am   . Sexual activity: Yes    Birth control/ protection: Injection     Comment: Depo Provera   Other Topics Concern  . None   Social History Narrative  . None   Additional Social History:    Pain Medications: See home med list Prescriptions: See home med list Over the Counter: See home med list History of alcohol / drug use?: Yes Longest period of sobriety (when/how long): unknown Negative Consequences of  Use: Personal relationships, Surveyor, quantity, [employer] Withdrawal Symptoms: Agitation, Irritability, Patient aware of relationship between substance abuse and physical/medical complications, Seizures, Tremors Onset of Seizures: related to benzo withdrawal Date of most recent seizure: summer of 2017 Name of Substance 1: heroin and "roxies" 1 - Age of First Use: unk 1 - Amount (size/oz): varies 1 - Frequency: daily 1 - Duration: ongoing 1 - Last Use / Amount: 03/22/16 Name of Substance 2: xanax 2 - Age of First Use: unk 2 - Amount (size/oz): varies 2 -  Frequency: daily 2 - Duration: ongoing 2 - Last Use / Amount: 03/23/16 Name of Substance 3: cocaine 3 - Age of First Use: unk 3 - Amount (size/oz): varies 3 - Frequency: every 2-3 months 3 - Duration: ongoing 3 - Last Use / Amount: 2/3-03/21/2016 Name of Substance 4: ETOH 4 - Age of First Use: unk 4 - Amount (size/oz): varies-"social drinker" 4 - Frequency: varies-maybe every 2-3 months 4 - Duration: ongoing 4 - Last Use / Amount: Christmas/New Year"s Name of Substance 5: Nicotine 5 - Age of First Use: unk 5 - Amount (size/oz): 1 pack 5 - Frequency: daily 5 - Duration: ongoing 5 - Last Use / Amount: 03/23/16          Sleep: Fair  Appetite:  Poor  Current Medications: Current Facility-Administered Medications  Medication Dose Route Frequency Provider Last Rate Last Dose  . acetaminophen (TYLENOL) tablet 650 mg  650 mg Oral Q6H PRN Kerry Hough, PA-C   650 mg at 03/25/16 0759  . albuterol (PROVENTIL HFA;VENTOLIN HFA) 108 (90 Base) MCG/ACT inhaler 1-2 puff  1-2 puff Inhalation Q6H PRN Kerry Hough, PA-C   2 puff at 03/25/16 [phone]  . alum & mag hydroxide-simeth (MAALOX/MYLANTA) 200-200-20 MG/5ML suspension 30 mL  30 mL Oral Q4H PRN Kerry Hough, PA-C      . chlordiazePOXIDE (LIBRIUM) capsule 25 mg  25 mg Oral Q6H PRN Burnard Leigh, MD      . chlordiazePOXIDE (LIBRIUM) capsule 25 mg  25 mg Oral QID Burnard Leigh, MD   25 mg at 03/25/16 0755   Followed by  . [START ON 03/26/2016] chlordiazePOXIDE (LIBRIUM) capsule 25 mg  25 mg Oral TID Burnard Leigh, MD       Followed by  . [START ON 03/27/2016] chlordiazePOXIDE (LIBRIUM) capsule 25 mg  25 mg Oral BH-qamhs Burnard Leigh, MD       Followed by  . [START ON 03/28/2016] chlordiazePOXIDE (LIBRIUM) capsule 25 mg  25 mg Oral Daily Burnard Leigh, MD      . cloNIDine (CATAPRES) tablet 0.1 mg  0.1 mg Oral QID Kerry Hough, PA-C   0.1 mg at 03/25/16 0755   Followed by  . [START ON 03/26/2016] cloNIDine  (CATAPRES) tablet 0.1 mg  0.1 mg Oral BH-qamhs Spencer E Simon, PA-C       Followed by  . [START ON 03/28/2016] cloNIDine (CATAPRES) tablet 0.1 mg  0.1 mg Oral QAC breakfast Kerry Hough, PA-C      . dicyclomine (BENTYL) tablet 20 mg  20 mg Oral Q6H PRN Kerry Hough, PA-C   20 mg at 03/25/16 0759  . DULoxetine (CYMBALTA) DR capsule 20 mg  20 mg Oral BID Kerry Hough, PA-C   20 mg at 03/25/16 0753  . gabapentin (NEURONTIN) capsule 300 mg  300 mg Oral TID Kerry Hough, PA-C   300 mg at 03/25/16 0753  . hydrOXYzine (ATARAX/VISTARIL) tablet  25 mg  25 mg Oral Q6H PRN Kerry HoughSpencer E Simon, PA-C   25 mg at 03/24/16 1219  . loperamide (IMODIUM) capsule 2-4 mg  2-4 mg Oral PRN Kerry HoughSpencer E Simon, PA-C      . magnesium hydroxide (MILK OF MAGNESIA) suspension 30 mL  30 mL Oral Daily PRN Kerry HoughSpencer E Simon, PA-C      . methocarbamol (ROBAXIN) tablet 500 mg  500 mg Oral Q8H PRN Kerry HoughSpencer E Simon, PA-C   500 mg at 03/24/16 [id number]  . multivitamin with minerals tablet 1 tablet  1 tablet Oral Daily Burnard LeighAlexander Arya Eksir, MD   1 tablet at 03/25/16 [id number]  . naproxen (NAPROSYN) tablet 500 mg  500 mg Oral BID PRN Kerry HoughSpencer E Simon, PA-C   500 mg at 03/24/16 [id number]  . nicotine (NICODERM CQ - dosed in mg/24 hours) patch 21 mg  21 mg Transdermal Daily Kerry HoughSpencer E Simon, PA-C   21 mg at 03/24/16 0810  . ondansetron (ZOFRAN-ODT) disintegrating tablet 4 mg  4 mg Oral Q6H PRN Kerry HoughSpencer E Simon, PA-C   4 mg at 03/25/16 0759  . QUEtiapine (SEROQUEL) tablet 100 mg  100 mg Oral QHS,MR X 1 Kerry HoughSpencer E Simon, PA-C   100 mg at 03/24/16 2158  . thiamine (VITAMIN B-1) tablet 100 mg  100 mg Oral Daily Burnard LeighAlexander Arya Eksir, MD   100 mg at 03/25/16 [id number]    Lab Results:  Results for orders placed or performed during the hospital encounter of 03/24/16 (from the past 48 hour(s))  TSH     Status: None   Collection Time: 03/24/16  6:16 AM  Result Value Ref Range   TSH 4.487 0.350 - 4.500 uIU/mL    Comment: Performed by a 3rd Generation assay with a  functional sensitivity of <=0.01 uIU/mL. Performed at [facility], [address] [place], [place] [address]   Lipid panel     Status: None   Collection Time: 03/24/16  6:16 AM  Result Value Ref Range   Cholesterol 164 0 - 200 mg/dL   Triglycerides 99 <578<150 mg/dL   HDL 47 >46>40 mg/dL   Total CHOL/HDL Ratio 3.5 RATIO   VLDL 20 0 - 40 mg/dL   LDL Cholesterol 97 0 - 99 mg/dL    Comment:        Total Cholesterol/HDL:CHD Risk Coronary Heart Disease Risk Table                     Men   Women  1/2 Average Risk   3.4   3.3  Average Risk       5.0   4.4  2 X Average Risk   9.6   7.1  3 X Average Risk  23.4   11.0        Use the calculated Patient Ratio above and the CHD Risk Table to determine the patient's CHD Risk.        ATP III CLASSIFICATION (LDL):  <100     mg/dL   Optimal  [id number]  mg/dL   Near or Above                    Optimal  130-159  mg/dL   Borderline  [id number]  mg/dL   High  >401>190     mg/dL   Very High Performed at [facility], [address] [place], [place] [address]   Hemoglobin A1c     Status: None   Collection  Time: 03/24/16  6:16 AM  Result Value Ref Range   Hgb A1c MFr Bld 5.2 4.8 - 5.6 %    Comment: (NOTE)         Pre-diabetes: 5.7 - 6.4         Diabetes: >6.4         Glycemic control for adults with diabetes: <7.0    Mean Plasma Glucose 103 mg/dL    Comment: (NOTE) Performed At: [facility] [address] [place], [place] [id number] Mila Homer MD [id number] Performed at [facility], [address], [place], [place] [address]   Prolactin     Status: Abnormal   Collection Time: 03/24/16  6:16 AM  Result Value Ref Range   Prolactin 54.1 (H) 4.8 - 23.3 ng/mL    Comment: (NOTE) Performed At: [facility] [address] [place], [place] [id number] Mila Homer MD [id number] Performed at [facility], [address], [place], [place] [address]      Blood Alcohol level:  Lab Results  Component Value Date   ETH <5 03/23/2016    Metabolic Disorder Labs: Lab Results  Component Value Date   HGBA1C 5.2 03/24/2016   MPG 103 03/24/2016   Lab Results  Component Value Date   PROLACTIN 54.1 (H) 03/24/2016   Lab Results  Component Value Date   CHOL 164 03/24/2016   TRIG 99 03/24/2016   HDL 47 03/24/2016   CHOLHDL 3.5 03/24/2016   VLDL 20 03/24/2016   LDLCALC 97 03/24/2016    Physical Findings: AIMS: Facial and Oral Movements Muscles of Facial Expression: None, normal Lips and Perioral Area: None, normal Jaw: None, normal Tongue: None, normal,Extremity Movements Upper (arms, wrists, hands, fingers): None, normal Lower (legs, knees, ankles, toes): None, normal, Trunk Movements Neck, shoulders, hips: None, normal, Overall Severity Severity of abnormal movements (highest score from questions above): None, normal Incapacitation due to abnormal movements: None, normal Patient's awareness of abnormal movements (rate only patient's report): No Awareness, Dental Status Current problems with teeth and/or dentures?: No Does patient usually wear dentures?: No  CIWA:  CIWA-Ar Total: 5 COWS:  COWS Total Score: 9  Musculoskeletal: Strength & Muscle Tone: decreased Gait & Station: normal, weak and slow gait Patient leans: N/A  Psychiatric Specialty Exam: Physical Exam  Review of Systems  Constitutional: Positive for malaise/fatigue.  Musculoskeletal: Positive for myalgias.  Neurological: Positive for headaches.  Psychiatric/Behavioral: The patient is nervous/anxious.   All other systems reviewed and are negative.   Blood pressure (!) 95/55, pulse 87, temperature 97.9 F (36.6 C), temperature source Oral, resp. rate 16, height 5' 1.5" (1.562 m), weight 66.7 kg (147 lb), SpO2 99 %.Body mass index is 27.33 kg/m.  General Appearance: Fairly Groomed  Eye Contact:  Fair  Speech:  Slow  Volume:  Decreased  Mood:  Dysphoric   Affect:  Appropriate and Congruent  Thought Process:  Linear  Orientation:  Full (Time, Place, and Person)  Thought Content:  Logical  Suicidal Thoughts:  No  Homicidal Thoughts:  No  Memory:  Immediate;   Fair  Judgement:  Intact  Insight:  Shallow  Psychomotor Activity:  Decreased  Concentration:  Concentration: Poor and Attention Span: Fair  Recall:  [employer] of Knowledge:  Fair  Language:  Fair  Akathisia:  Negative  Handed:  Right  AIMS (if indicated):     Assets:  Desire for Improvement Housing Social Support  ADL's:  Intact  Cognition:  WNL  Sleep:  Number of Hours: 6.75    Treatment Plan Summary: Daily contact with patient to assess and evaluate symptoms and progress in treatment and Medication management   # Opiate Use Disorder, Benzodiazepine Use Disorder - Continue CIWA and librium taper as ordered - Continue supportive therapies for opiate withdrawal - Encourage PO fluids and food intake; vitals currently within acceptable parameters - Thiamine, multivitamin, and supplementation as indicated - Follow-up AM electrolytes given opiate withdrawal and minimal PO intake  # History of Bipolar Disorder, Depression, PTSD - Continue current psychotropics; cymbalta and seroquel - Given her history of post partum, I would be concerned about bipolar disorder - Further collateral from family, will coordinate with SW - Will consider the need for mood stabilizing agents as she is better able to participate in the discussion (current dose of seroquel is augmenting but not stabilizing)  # Disposition - Coordinate with SW and family; patient is presently interested in dispo to substance treatment facility for longer term care  Burnard Leigh, MD 03/25/2016, 8:50 AM

## 2016-03-25 NOTE — [facility] Group Notes (Signed)
[facility] LCSW Group Therapy  03/25/2016 2:56 PM  Type of Therapy:  Group Therapy  Participation Level:  Did Not Attend-pt invited. Chose to remain in bed.   Summary of Progress/Problems: [facility] Speaker came to talk about his personal journey with substance abuse and addiction. The pt processed ways by which to relate to the speaker. [facility] speaker provided handouts and educational information pertaining to groups and services offered by the [facility].   Aspasia Rude N Smart LCSW 03/25/2016, 2:56 PM

## 2016-03-25 NOTE — Plan of Care (Signed)
Problem: Safety: Goal: Periods of time without injury will increase Outcome: Progressing Pt. remians a high fall risk, hx. of seizures, denies SI/HI/AVH at this time,  Q 15 checks in place for safety.

## 2016-03-25 NOTE — Progress Notes (Signed)
D: Kathy [name] denied SI, HI, and AVH this a.m. She's endorsing significant S&S of withdrawal. She's remained in bed for much of the day, not participating in programming. After an upsetting phone call at about 1500, she approached this writer in tears wondering about her discharge plans, if arrangements were going to be made for longer-term care after discharge. Reassurance, encouragement, and PRNs given, with results pending. She looks brighter this afternoon.   A: Meds given as ordered, including PRNs (see MAR). Q15 safety checks maintained. Support/encouragement offered.  R: Pt remains free from harm and continues with treatment. Will continue to monitor for needs/safety.

## 2016-03-25 NOTE — Progress Notes (Signed)
Adult Psychoeducational Group Note  Date:  03/25/2016 Time:  9:05 PM  Group Topic/Focus:  Wrap-Up Group:   The focus of this group is to help patients review their daily goal of treatment and discuss progress on daily workbooks.  Participation Level:  Active  Participation Quality:  Appropriate  Affect:  Appropriate  Cognitive:  Appropriate  Insight: Appropriate  Engagement in Group:  Engaged  Modes of Intervention:  Discussion  Additional Comments:  The patient attended wrap up group and expressed that she had a alright day.  Octavio Mannshigpen, Janique Hoefer Lee 03/25/2016, 9:05 PM

## 2016-03-25 NOTE — Progress Notes (Signed)
Pt asked why she was on Cymbalta. "I'm not depressed," she said. "I don't need to be on an antidepressant." Urged her to discuss matter with provider tomorrow.

## 2016-03-26 LAB — COMPREHENSIVE METABOLIC PANEL
ALT: 41 U/L (ref 14–54)
ANION GAP: 8 (ref 5–15)
AST: 32 U/L (ref 15–41)
Albumin: 3.9 g/dL (ref 3.5–5.0)
Alkaline Phosphatase: 43 U/L (ref 38–126)
BUN: 19 mg/dL (ref 6–20)
CHLORIDE: 105 mmol/L (ref 101–111)
CO2: 27 mmol/L (ref 22–32)
Calcium: 9.1 mg/dL (ref 8.9–10.3)
Creatinine, Ser: 0.88 mg/dL (ref 0.44–1.00)
Glucose, Bld: 104 mg/dL — ABNORMAL HIGH (ref 65–99)
POTASSIUM: 4 mmol/L (ref 3.5–5.1)
SODIUM: 140 mmol/L (ref 135–145)
Total Bilirubin: 0.7 mg/dL (ref 0.3–1.2)
Total Protein: 7.3 g/dL (ref 6.5–8.1)

## 2016-03-26 MED ORDER — DULOXETINE HCL 20 MG PO CPEP
40.0000 mg | ORAL_CAPSULE | Freq: Every day | ORAL | Status: DC
  Administered 2016-03-27 – 2016-03-29 (×3): 40 mg via ORAL
  Filled 2016-03-26 (×4): qty 2

## 2016-03-26 MED ORDER — PRAZOSIN HCL 2 MG PO CAPS
2.0000 mg | ORAL_CAPSULE | Freq: Every day | ORAL | Status: DC
  Administered 2016-03-26 – 2016-03-28 (×3): 2 mg via ORAL
  Filled 2016-03-26 (×4): qty 1
  Filled 2016-03-26: qty 2

## 2016-03-26 MED ORDER — HYDROXYZINE HCL 50 MG PO TABS
50.0000 mg | ORAL_TABLET | Freq: Four times a day (QID) | ORAL | Status: DC | PRN
  Administered 2016-03-26: 50 mg via ORAL
  Filled 2016-03-26: qty 1

## 2016-03-26 MED ORDER — GABAPENTIN 300 MG PO CAPS
600.0000 mg | ORAL_CAPSULE | Freq: Three times a day (TID) | ORAL | Status: DC
  Administered 2016-03-26 – 2016-03-27 (×2): 600 mg via ORAL
  Filled 2016-03-26 (×5): qty 2

## 2016-03-26 MED ORDER — HYDROXYZINE HCL 50 MG PO TABS
100.0000 mg | ORAL_TABLET | Freq: Three times a day (TID) | ORAL | Status: DC | PRN
  Administered 2016-03-26 – 2016-03-27 (×2): 100 mg via ORAL
  Filled 2016-03-26 (×3): qty 2

## 2016-03-26 MED ORDER — ARIPIPRAZOLE 2 MG PO TABS
2.0000 mg | ORAL_TABLET | Freq: Every day | ORAL | Status: DC
  Administered 2016-03-26 – 2016-03-27 (×2): 2 mg via ORAL
  Filled 2016-03-26 (×4): qty 1

## 2016-03-26 NOTE — Progress Notes (Signed)
[facility] MD Progress Note  03/26/2016 3:57 PM Kathy [name]  MRN:  [id number]  Subjective:  Spent about 30-40 minutes engaging in discussion with patient about her motivations to quit using heroine. She shares that her partner (father of her kids) is a Armed forces technical officer and gives her heroine to get her to "do things" that he wants her to do.  Spent time expressing sadness and grief for her and allinging on her goals of being a better mom, improving her life and job outlook.  She remains resistant at times, noting that she hates it in the hospital and its too anxiety provoking.  We spent time discussing her medication regimen and she shares that she remains worried, and has racing thoughts much of the day.  She is agreeable to remaining on Cymbalta but wishes to have additional augmenting strategies.  Discussed augmenting with abilify for mood and given her unclear history of possible bipolar affective disorder.  (I did not elicit a history from her consistent with bipolar disorder).  Discussed discontinuing seroquel and switching to prazosin for sleep, given her night-time vigilance and nightmares may be more responsive to this.  Reviewed the risks and benefits of the changes and of AAP use.  We also agreed to increase gabapentin given her fibromyalgia pains.  She denies any Si, Hi, AVH.  She asks several times if "well if I decided I want to leave, can I leave?"  Explained that she is not under IVC and based on her ability to remain safe with herself and others, the determination would be made.  Spent time exploring her prior admissions and the misteps that lead to relapse.  Encouraged her to continue to work with [place] for her improvement and mood management, and consider appropriate outpatient treatment options.  She agrees to remain at this time.   Principal Problem: Opiate abuse, continuous Diagnosis:   Patient Active Problem List   Diagnosis Date Noted  . Substance induced mood disorder ([place]) [F19.94] 03/24/2016   . History of 3 spontaneous abortions [Z87.42] 03/24/2016  . Eczema [L30.9] 03/24/2016  . Depression [F32.9] 03/24/2016  . Asthma without status asthmaticus without complication [U27.253] 66/44/0347  . Anxiety [F41.9] 03/24/2016  . Opiate abuse, continuous [F11.10] 03/24/2016  . Fibromyalgia [M79.7] 04/17/2015  . Chronic hepatitis C virus infection ([place]) [B18.2] 02/28/2015  . Chronic migraine [G43.709] 07/23/2014  . Bipolar disorder ([place]) [F31.9] 05/10/2013  . PTSD (post-traumatic stress disorder) [F43.10] 05/10/2013  . Substance abuse [F19.10] 10/03/2012   Total Time spent with patient: 45 minutes  Past Psychiatric History: See H&P for full details  Past Medical History:  Past Medical History:  Diagnosis Date  . Anxiety   . Bipolar disorder ([place])   . Compression fracture of spine ([place])   . Fibromyalgia   . Headache   . Seizures ([place])    Related to benzo withdrawal    Past Surgical History:  Procedure Laterality Date  . HAND SURGERY Right    Family History: History reviewed. No pertinent family history. Family Psychiatric  History: See H&P for full details  Social History:  History  Alcohol Use No     History  Drug Use  . Types: Cocaine, Benzodiazepines, Heroin, Hydrocodone    Comment: Opiods, last used: Last night 3am     Social History   Social History  . Marital status: Single    Spouse name: N/A  . Number of children: N/A  . Years of education: N/A   Social History Main Topics  .  Smoking status: Current Every Day Smoker    Packs/day: 1.00    Types: Cigarettes  . Smokeless tobacco: Never Used     Comment: Pt does not want to stop smoking at this time  . Alcohol use No  . Drug use: Yes    Types: Cocaine, Benzodiazepines, Heroin, Hydrocodone     Comment: Opiods, last used: Last night 3am   . Sexual activity: Yes    Birth control/ protection: Injection     Comment: Depo Provera   Other Topics Concern  . None   Social History Narrative  . None    Additional Social History:    Pain Medications: See home med list Prescriptions: See home med list Over the Counter: See home med list History of alcohol / drug use?: Yes Longest period of sobriety (when/how long): unknown Negative Consequences of Use: Personal relationships, Financial, Work / School Withdrawal Symptoms: Agitation, Irritability, Patient aware of relationship between substance abuse and physical/medical complications, Seizures, Tremors Onset of Seizures: related to benzo withdrawal Date of most recent seizure: summer of 2017 Name of Substance 1: heroin and "roxies" 1 - Age of First Use: unk 1 - Amount (size/oz): varies 1 - Frequency: daily 1 - Duration: ongoing 1 - Last Use / Amount: 03/22/16 Name of Substance 2: xanax 2 - Age of First Use: unk 2 - Amount (size/oz): varies 2 - Frequency: daily 2 - Duration: ongoing 2 - Last Use / Amount: 03/23/16 Name of Substance 3: cocaine 3 - Age of First Use: unk 3 - Amount (size/oz): varies 3 - Frequency: every 2-3 months 3 - Duration: ongoing 3 - Last Use / Amount: 2/3-03/21/2016 Name of Substance 4: ETOH 4 - Age of First Use: unk 4 - Amount (size/oz): varies-"social drinker" 4 - Frequency: varies-maybe every 2-3 months 4 - Duration: ongoing 4 - Last Use / Amount: Christmas/New Year"s Name of Substance 5: Nicotine 5 - Age of First Use: unk 5 - Amount (size/oz): 1 pack 5 - Frequency: daily 5 - Duration: ongoing 5 - Last Use / Amount: 03/23/16          Sleep: Good  Appetite:  Fair  Current Medications: Current Facility-Administered Medications  Medication Dose Route Frequency Provider Last Rate Last Dose  . acetaminophen (TYLENOL) tablet 650 mg  650 mg Oral Q6H PRN Laverle Hobby, PA-C   650 mg at 03/25/16 2138  . albuterol (PROVENTIL HFA;VENTOLIN HFA) 108 (90 Base) MCG/ACT inhaler 1-2 puff  1-2 puff Inhalation Q6H PRN Laverle Hobby, PA-C   2 puff at 03/25/16 [id number]  . alum & mag hydroxide-simeth  (MAALOX/MYLANTA) 200-200-20 MG/5ML suspension 30 mL  30 mL Oral Q4H PRN Laverle Hobby, PA-C      . ARIPiprazole (ABILIFY) tablet 2 mg  2 mg Oral Daily Aundra Dubin, MD      . chlordiazePOXIDE (LIBRIUM) capsule 25 mg  25 mg Oral Q6H PRN Aundra Dubin, MD      . chlordiazePOXIDE (LIBRIUM) capsule 25 mg  25 mg Oral TID Aundra Dubin, MD   25 mg at 03/26/16 1333   Followed by  . [START ON 03/27/2016] chlordiazePOXIDE (LIBRIUM) capsule 25 mg  25 mg Oral BH-qamhs Aundra Dubin, MD       Followed by  . [START ON 03/28/2016] chlordiazePOXIDE (LIBRIUM) capsule 25 mg  25 mg Oral Daily Aundra Dubin, MD      . cloNIDine (CATAPRES) tablet 0.1 mg  0.1 mg Oral BH-qamhs Laverle Hobby, PA-C  0.1 mg at 03/26/16 0830   Followed by  . [START ON 03/28/2016] cloNIDine (CATAPRES) tablet 0.1 mg  0.1 mg Oral QAC breakfast Laverle Hobby, PA-C      . dicyclomine (BENTYL) tablet 20 mg  20 mg Oral Q6H PRN Laverle Hobby, PA-C   20 mg at 03/25/16 0759  . [START ON 03/27/2016] DULoxetine (CYMBALTA) DR capsule 40 mg  40 mg Oral Daily Aundra Dubin, MD      . gabapentin (NEURONTIN) capsule 600 mg  600 mg Oral TID Aundra Dubin, MD      . hydrOXYzine (ATARAX/VISTARIL) tablet 100 mg  100 mg Oral TID PRN Aundra Dubin, MD      . loperamide (IMODIUM) capsule 2-4 mg  2-4 mg Oral PRN Laverle Hobby, PA-C      . magnesium hydroxide (MILK OF MAGNESIA) suspension 30 mL  30 mL Oral Daily PRN Laverle Hobby, PA-C      . methocarbamol (ROBAXIN) tablet 500 mg  500 mg Oral Q8H PRN Laverle Hobby, PA-C   500 mg at 03/26/16 8270  . multivitamin with minerals tablet 1 tablet  1 tablet Oral Daily Aundra Dubin, MD   1 tablet at 03/26/16 0830  . naproxen (NAPROSYN) tablet 500 mg  500 mg Oral BID PRN Laverle Hobby, PA-C   500 mg at 03/26/16 7867  . nicotine (NICODERM CQ - dosed in mg/24 hours) patch 21 mg  21 mg Transdermal Daily Laverle Hobby, PA-C   21 mg at 03/26/16 5449   . ondansetron (ZOFRAN-ODT) disintegrating tablet 4 mg  4 mg Oral Q6H PRN Laverle Hobby, PA-C   4 mg at 03/25/16 0759  . prazosin (MINIPRESS) capsule 2 mg  2 mg Oral QHS Aundra Dubin, MD      . thiamine (VITAMIN B-1) tablet 100 mg  100 mg Oral Daily Aundra Dubin, MD   100 mg at 03/26/16 0830    Lab Results:  Results for orders placed or performed during the hospital encounter of 03/24/16 (from the past 48 hour(s))  Comprehensive metabolic panel     Status: Abnormal   Collection Time: 03/26/16  6:22 AM  Result Value Ref Range   Sodium 140 135 - 145 mmol/L   Potassium 4.0 3.5 - 5.1 mmol/L   Chloride 105 101 - 111 mmol/L   CO2 27 22 - 32 mmol/L   Glucose, Bld 104 (H) 65 - 99 mg/dL   BUN 19 6 - 20 mg/dL   Creatinine, Ser 0.88 0.44 - 1.00 mg/dL   Calcium 9.1 8.9 - 10.3 mg/dL   Total Protein 7.3 6.5 - 8.1 g/dL   Albumin 3.9 3.5 - 5.0 g/dL   AST 32 15 - 41 U/L   ALT 41 14 - 54 U/L   Alkaline Phosphatase 43 38 - 126 U/L   Total Bilirubin 0.7 0.3 - 1.2 mg/dL   GFR calc non Af Amer >60 >60 mL/min   GFR calc Af Amer >60 >60 mL/min    Comment: (NOTE) The eGFR has been calculated using the CKD EPI equation. This calculation has not been validated in all clinical situations. eGFR's persistently <60 mL/min signify possible Chronic Kidney Disease.    Anion gap 8 5 - 15    Comment: Performed at [facility], [place] [address], [place], [place] [address]    Blood Alcohol level:  Lab Results  Component Value Date   [facility] <5 03/23/2016  Metabolic Disorder Labs: Lab Results  Component Value Date   HGBA1C 5.2 03/24/2016   MPG 103 03/24/2016   Lab Results  Component Value Date   PROLACTIN 54.1 (H) 03/24/2016   Lab Results  Component Value Date   CHOL 164 03/24/2016   TRIG 99 03/24/2016   HDL 47 03/24/2016   CHOLHDL 3.5 03/24/2016   VLDL 20 03/24/2016   LDLCALC 97 03/24/2016    Physical Findings: AIMS: Facial and Oral Movements Muscles of  Facial Expression: None, normal Lips and Perioral Area: None, normal Jaw: None, normal Tongue: None, normal,Extremity Movements Upper (arms, wrists, hands, fingers): None, normal Lower (legs, knees, ankles, toes): None, normal, Trunk Movements Neck, shoulders, hips: None, normal, Overall Severity Severity of abnormal movements (highest score from questions above): None, normal Incapacitation due to abnormal movements: None, normal Patient's awareness of abnormal movements (rate only patient's report): No Awareness, Dental Status Current problems with teeth and/or dentures?: No Does patient usually wear dentures?: No  CIWA:  CIWA-Ar Total: 4 COWS:  COWS Total Score: 7  Musculoskeletal: Strength & Muscle Tone: within normal limits Gait & Station: normal Patient leans: N/A  Psychiatric Specialty Exam: Physical Exam  ROS  Blood pressure 104/63, pulse 82, temperature 98.4 F (36.9 C), temperature source Oral, resp. rate 16, height 5' 1.5" (1.562 m), weight 66.7 kg (147 lb), SpO2 99 %.Body mass index is 27.33 kg/m.  General Appearance: Casual and Fairly Groomed  Eye Contact:  Good  Speech:  Clear and Coherent  Volume:  Normal  Mood:  Anxious  Affect:  Appropriate  Thought Process:  Coherent  Orientation:  Full (Time, Place, and Person)  Thought Content:  Logical  Suicidal Thoughts:  No  Homicidal Thoughts:  No  Memory:  Recent;   Good  Judgement:  Poor  Insight:  Shallow  Psychomotor Activity:  Normal  Concentration:  Concentration: Fair  Recall:  NA  Fund of Knowledge:  Fair  Language:  Fair  Akathisia:  Negative  Handed:  Right  AIMS (if indicated):     Assets:  Ship broker  ADL's:  Intact  Cognition:  WNL  Sleep:  Number of Hours: 6.75   Treatment Plan Summary: Daily contact with patient to assess and evaluate symptoms and progress in treatment and Medication management  # Bipolar Disorder, PTSD, Depression -  Continue Cymbalta consolidated to 40 mg QAM - Initiate Aripiprazole 2 mg daily, goal dose 5-10 mg daily - Discontinue seroquel given poor metabolic profile and inadequate coverage for sleep related nightmares - Initiate prazosin 2 mg QHS for sleep; titrate 1-2 mg weekly as tolerated  # Fibromyalgia - Increase Neurontin to 600 mg TID - Continue Cymbalta daily  # Opiate Use Disorder, benzodiazepine use disorder - Continue detox protocol - Coordinate for appropriate outpatient vs residential treatment options on discharge  # Disposition - Coordinate with SW - CPS contact and remains involved in the patient's case at this time  Aundra Dubin, MD 03/26/2016, 3:57 PM

## 2016-03-26 NOTE — [facility] Group Notes (Signed)
[facility] LCSW Group Therapy  03/26/2016 3:44 PM  Type of Therapy:  Group Therapy  Participation Level:  Minimal  Participation Quality:  Attentive  Affect:  Anxious, Irritable and Resistant  Cognitive:  Lacking  Insight:  Limited  Engagement in Therapy:  Limited  Modes of Intervention:  Confrontation, Discussion, Education, Problem-solving, Socialization and Support  Summary of Progress/Problems: Self Sabotage members were invited to explore various examples of self sabatoge and how this behavior affects their recovery and potential for relapse. Group members were asked to identify examples of self sabatoge in their most recent mental health/substance abuse relapse. Kathy [name] was attentive throughout group but was resistant when asked to explore examples of self sabotage. Patient was gently confronted regarding her negative statements and "finding problems with possible solutions." Kathy [name] was somewhat able to explore and unhealthy relationship with the father of her children and open to thinking about ways to maintain healthy boundaries while co-parenting. She left group after about 20 minutes and did not return. At this time, Kathy [name] demonstrates minimal insight with some progress in the group setting. She believes that she is stuck in "neverending cycle that I'm trapped in" and struggles to find hope when looking in to the future.   Kathy [name] N Smart LCSW 03/26/2016, 3:44 PM

## 2016-03-26 NOTE — Progress Notes (Signed)
CSW faxed [facility] referral per patient request. Pt stated that she is able to return home if a bed is not available by the time she is ready to discharge. Risks associated with relapse discussed with patient.   Trula SladeHeather Smart, MSW, LCSW Clinical Social Worker 03/26/2016 3:48 PM

## 2016-03-26 NOTE — Plan of Care (Signed)
Problem: Activity: Goal: Interest or engagement in activities will improve Outcome: Progressing Pt. attended AA meeting this evening.    

## 2016-03-26 NOTE — Progress Notes (Addendum)
Nursing Note 03/26/2016 [id number]  Data Reports sleeping on and off, states she was able to fall asleep but kept waking up.   Declined to complete self-inventory sheet.  Denies HI, SI, AVH.  Biggest complaint is anxiety today.  Patient tearful and frustrated this AM due to not being prescribed Xanax or lorazepam. Stated "I don't know what y'all are doing here but it isn't working.  If you can't get me my medicine I would rather just do this at home.  I can get Xanax at home."   Action Spoke with patient 1:1, nurse offered support to patient throughout shift.  MD notified.  Patient given scheduled librium this AM with scheduled meds, therapeutic support given.  Later when patient was less irritable nurse educated patient on purpose of librium and taper, danger of benzodiazepine dependence.  Given PRN vistaril, received order to increase dose per patient request.  Mutual goal started to try to get on the same page with medicines and find something that will help her anxiety get to a tolerable level.  MD spoke with patient and adjusted medications.  Continues to be monitored on 15 minute checks for safety.  Response Patient guarded and irritable this AM, opened up more in afternoon and more receptive to collaborative care.  Reports relief with increased vistaril dosing.

## 2016-03-26 NOTE — Progress Notes (Signed)
  DATA ACTION RESPONSE  Objective- Pt. is up and visible in the milieu, interacting with peers and watching TV. Pt. presents with an anxious/pained affect and mood. Subjective- Denies having any SI/HI/AVH at this time. Rates pain 6/10; generalized but mostly lower back. Pt. states " I am open to a long term treatment and my family is supportive of it; they just put in a request for [facility] ". Pt. continues to be cooperative and remain safe on the unit.  1:1 interaction in private to establish rapport. Encouragement, education, & support given from staff. Meds. ordered and administered. First dose med. education given on minipress. BP/HR improving and stable. PRN Naproxen, Robaxin, and Vistaril  requested and will re-eval accordingly.   Safety maintained with Q 15 checks. Continues to follow treatment plan and will monitor closely. No additonal questions/concerns noted.

## 2016-03-26 NOTE — Progress Notes (Signed)
Patient attended AA group meeting.  

## 2016-03-26 NOTE — Progress Notes (Signed)
Recreation Therapy Notes  Date: 03/26/16 Time: 0930 Location: [address] Dayroom  Group Topic: Stress Management  Goal Area(s) Addresses:  Patient will verbalize importance of using healthy stress management.  Patient will identify positive emotions associated with healthy stress management.   Intervention: Stress Management  Activity :  Peaceful Place.  LRT introduced the stress management technique of guided imagery.  LRT read a script to allow patients to engage in a "mental vacation".  Patients were to follow along as LRT read script.  Education:  Stress Management, Discharge Planning.   Education Outcome: Acknowledges edcuation/In group clarification offered/Needs additional education  Clinical Observations/Feedback: Pt did not attend group.   Caroll RancherMarjette Drake Wuertz, LRT/CTRS         Caroll RancherLindsay, Cerenity Goshorn A 03/26/2016 12:10 PM

## 2016-03-26 NOTE — Progress Notes (Addendum)
  DATA ACTION RESPONSE  Objective- Pt. is up and visible in the milieu, seen eating a snack and engaging in coversation with peers.  Pt. presents with a pained/anxious affect and mood. Pt. was seen out in the dayroom more this evening and not isolative to room in comparison to the night before. Pt was  A & O x 3. Subjective- Denies having any SI/HI/AVH at this time. Rates pain 9/10; generalized. Pt. states " I don't know anything about my meds; I feel tired all day". Pt. continues to be cooperative and remain safe on the unit.  1:1 interaction in private to establish rapport. Encouragement, education, & support given from staff. Meds. ordered and administered. PRN Tylenol requested and will re-eval accordingly. BP/HR WNL (see flow sheet). Pt. was encourage to push fluids; Gatorade given. Nighttime med. education given to Pt.   Safety maintained with Q 15 checks. Continues to follow treatment plan and will monitor closely. No additonal questions/concerns noted.

## 2016-03-27 DIAGNOSIS — M797 Fibromyalgia: Secondary | ICD-10-CM

## 2016-03-27 DIAGNOSIS — F3130 Bipolar disorder, current episode depressed, mild or moderate severity, unspecified: Secondary | ICD-10-CM

## 2016-03-27 DIAGNOSIS — F1390 Sedative, hypnotic, or anxiolytic use, unspecified, uncomplicated: Secondary | ICD-10-CM

## 2016-03-27 DIAGNOSIS — F1490 Cocaine use, unspecified, uncomplicated: Secondary | ICD-10-CM

## 2016-03-27 DIAGNOSIS — F4310 Post-traumatic stress disorder, unspecified: Secondary | ICD-10-CM

## 2016-03-27 MED ORDER — ARIPIPRAZOLE 5 MG PO TABS
5.0000 mg | ORAL_TABLET | Freq: Every day | ORAL | Status: DC
  Administered 2016-03-28 – 2016-03-30 (×3): 5 mg via ORAL
  Filled 2016-03-27 (×5): qty 1

## 2016-03-27 MED ORDER — NAPROXEN 500 MG PO TABS
500.0000 mg | ORAL_TABLET | Freq: Two times a day (BID) | ORAL | Status: DC | PRN
  Administered 2016-03-28: 500 mg via ORAL
  Filled 2016-03-27: qty 1

## 2016-03-27 MED ORDER — HYDROXYZINE HCL 50 MG PO TABS
50.0000 mg | ORAL_TABLET | Freq: Four times a day (QID) | ORAL | Status: DC
  Administered 2016-03-27 – 2016-03-30 (×13): 50 mg via ORAL
  Filled 2016-03-27 (×22): qty 1

## 2016-03-27 MED ORDER — GABAPENTIN 300 MG PO CAPS
600.0000 mg | ORAL_CAPSULE | ORAL | Status: DC
  Administered 2016-03-27 – 2016-03-30 (×9): 600 mg via ORAL
  Filled 2016-03-27 (×15): qty 2

## 2016-03-27 MED ORDER — OLANZAPINE 5 MG PO TBDP
5.0000 mg | ORAL_TABLET | Freq: Three times a day (TID) | ORAL | Status: DC | PRN
  Administered 2016-03-27 – 2016-03-28 (×4): 5 mg via ORAL
  Filled 2016-03-27 (×5): qty 1

## 2016-03-27 NOTE — Progress Notes (Signed)
Nursing Note 03/27/2016 [id number]  Data Reports sleeping poorly "my roommate kept me up, I don't think the medicine is working. I need seroquel." Reports ongoing anxiety.  Did not complete self-inventory sheet. Affect appropriate.  Denies HI, SI, AVH.  Very focused on medicines today.  C/O aching and anxiety.   Missed afternoon group.  Action Spoke with patient 1:1, nurse offered support to patient throughout shift.  PRN's given per MAR.  NP adjusted medicines and PRN's, given a dose of Zydis after lunch.  Continues to be monitored on 15 minute checks for safety.  Response PRN's effective, Zydis effective. Patient napped in afternoon and said she felt better.  Remains safe on unit.

## 2016-03-27 NOTE — [facility] Group Notes (Signed)
[facility] LCSW Group Therapy Note  03/27/2016  and  10:15 AM  Type of Therapy and Topic:  Group Therapy: Avoiding Self-Sabotaging and Engaging in Radical Acceptance  Participation Level:  Minimal  Participation Quality:  Attentive  Affect:  Flat and Lethargic  Cognitive:  Oriented  Insight:  Limited  Engagement in Therapy:  Limited   Therapeutic models used: Cognitive Behavioral Therapy,  Person-Centered Therapy and Motivational Interviewing  Modes of Intervention:  Discussion, Exploration, Orientation, Rapport Building, Socialization and Support   Summary of Progress/Problems:  The main focus of today's process group was for the patient to identify ways in which they may be able to engage in Radical Acceptance verses self sabotage. Motivational Interviewing was utilized to identify motivation they may have for wanting to change. The group processed multiple fears associated with change including the fear of success. Patient engaged easily in side conversations and was in and out of group room. Patient shared little other than the fact she will be taking opportunity to transfer for longer term treatment.   Carney Bernatherine C Raley Novicki, LCSW

## 2016-03-27 NOTE — Progress Notes (Signed)
[facility] Group Notes:  (Nursing/MHT/Case Management/Adjunct)  Date:  03/27/2016  Time:  10:37 PM  Type of Therapy:  Psychoeducational Skills  Participation Level:  Minimal  Participation Quality:  Resistant  Affect:  Flat  Cognitive:  Lacking  Insight:  Appropriate  Engagement in Group:  Limited  Modes of Intervention:  Education  Summary of Progress/Problems: The patient shared in group that she had a "tiring" day. She stated that she did not feel well, but did not go into further detail. Her goal for tomorrow is to feel better.   Hazle CocaGOODMAN, Reace Breshears S 03/27/2016, 10:37 PM

## 2016-03-27 NOTE — Progress Notes (Signed)
[facility] MD Progress Note  03/27/2016 1:11 PM Takyra Cantrall  MRN:  [id number]  Subjective:  Patient reports " I want to restart taking Seroquel, I don't like this medication combination." - of noted patient reports she is HEPC + states she is prx INCIVEK   Objective: Avanell Banwart is awake, alert and oriented *3 Seen resting in dayroom interacting with peers and others.  Denies suicidal or homicidal ideation. Denies auditory or visual hallucination and does not appear to be responding to internal stimuli.  Patient is ruminative with medications and side effects.  and Patient reports he is medication compliant without mediation side effects.Patient is requesting to be discharged on Monday (72hours is up at 9:00 am) Reports good appetite.  Patient reports she is not resting well. Support, encouragement and reassurance was provided.   Principal Problem: Opiate abuse, continuous Diagnosis:   Patient Active Problem List   Diagnosis Date Noted  . Substance induced mood disorder ([place]) [F19.94] 03/24/2016  . History of 3 spontaneous abortions [Z87.42] 03/24/2016  . Eczema [L30.9] 03/24/2016  . Depression [F32.9] 03/24/2016  . Asthma without status asthmaticus without complication [G18.299] 37/16/9678  . Anxiety [F41.9] 03/24/2016  . Opiate abuse, continuous [F11.10] 03/24/2016  . Fibromyalgia [M79.7] 04/17/2015  . Chronic hepatitis C virus infection ([place]) [B18.2] 02/28/2015  . Chronic migraine [G43.709] 07/23/2014  . Bipolar disorder ([place]) [F31.9] 05/10/2013  . PTSD (post-traumatic stress disorder) [F43.10] 05/10/2013  . Substance abuse [F19.10] 10/03/2012   Total Time spent with patient: 45 minutes  Past Psychiatric History: See H&P for full details  Past Medical History:  Past Medical History:  Diagnosis Date  . Anxiety   . Bipolar disorder ([place])   . Compression fracture of spine ([place])   . Fibromyalgia   . Headache   . Seizures ([place])    Related to benzo withdrawal    Past Surgical  History:  Procedure Laterality Date  . HAND SURGERY Right    Family History: History reviewed. No pertinent family history. Family Psychiatric  History: See H&P for full details  Social History:  History  Alcohol Use No     History  Drug Use  . Types: Cocaine, Benzodiazepines, Heroin, Hydrocodone    Comment: Opiods, last used: Last night 3am     Social History   Social History  . Marital status: Single    Spouse name: N/A  . Number of children: N/A  . Years of education: N/A   Social History Main Topics  . Smoking status: Current Every Day Smoker    Packs/day: 1.00    Types: Cigarettes  . Smokeless tobacco: Never Used     Comment: Pt does not want to stop smoking at this time  . Alcohol use No  . Drug use: Yes    Types: Cocaine, Benzodiazepines, Heroin, Hydrocodone     Comment: Opiods, last used: Last night 3am   . Sexual activity: Yes    Birth control/ protection: Injection     Comment: Depo Provera   Other Topics Concern  . None   Social History Narrative  . None   Additional Social History:    Pain Medications: See home med list Prescriptions: See home med list Over the Counter: See home med list History of alcohol / drug use?: Yes Longest period of sobriety (when/how long): unknown Negative Consequences of Use: Personal relationships, Financial, Work / School Withdrawal Symptoms: Agitation, Irritability, Patient aware of relationship between substance abuse and physical/medical complications, Seizures, Tremors Onset of Seizures: related to benzo  withdrawal Date of most recent seizure: summer of 2017 Name of Substance 1: heroin and "roxies" 1 - Age of First Use: unk 1 - Amount (size/oz): varies 1 - Frequency: daily 1 - Duration: ongoing 1 - Last Use / Amount: 03/22/16 Name of Substance 2: xanax 2 - Age of First Use: unk 2 - Amount (size/oz): varies 2 - Frequency: daily 2 - Duration: ongoing 2 - Last Use / Amount: 03/23/16 Name of Substance 3:  cocaine 3 - Age of First Use: unk 3 - Amount (size/oz): varies 3 - Frequency: every 2-3 months 3 - Duration: ongoing 3 - Last Use / Amount: 2/3-03/21/2016 Name of Substance 4: ETOH 4 - Age of First Use: unk 4 - Amount (size/oz): varies-"social drinker" 4 - Frequency: varies-maybe every 2-3 months 4 - Duration: ongoing 4 - Last Use / Amount: Christmas/New Year"s Name of Substance 5: Nicotine 5 - Age of First Use: unk 5 - Amount (size/oz): 1 pack 5 - Frequency: daily 5 - Duration: ongoing 5 - Last Use / Amount: 03/23/16          Sleep: Good  Appetite:  Fair  Current Medications: Current Facility-Administered Medications  Medication Dose Route Frequency Provider Last Rate Last Dose  . acetaminophen (TYLENOL) tablet 650 mg  650 mg Oral Q6H PRN Laverle Hobby, PA-C   650 mg at 03/25/16 2138  . albuterol (PROVENTIL HFA;VENTOLIN HFA) 108 (90 Base) MCG/ACT inhaler 1-2 puff  1-2 puff Inhalation Q6H PRN Laverle Hobby, PA-C   2 puff at 03/26/16 2305  . alum & mag hydroxide-simeth (MAALOX/MYLANTA) 200-200-20 MG/5ML suspension 30 mL  30 mL Oral Q4H PRN Laverle Hobby, PA-C      . [START ON 03/28/2016] ARIPiprazole (ABILIFY) tablet 5 mg  5 mg Oral Daily Derrill Center, NP      . chlordiazePOXIDE (LIBRIUM) capsule 25 mg  25 mg Oral Q6H PRN Aundra Dubin, MD      . chlordiazePOXIDE (LIBRIUM) capsule 25 mg  25 mg Oral BH-qamhs Aundra Dubin, MD   25 mg at 03/27/16 8938   Followed by  . [START ON 03/28/2016] chlordiazePOXIDE (LIBRIUM) capsule 25 mg  25 mg Oral Daily Aundra Dubin, MD      . cloNIDine (CATAPRES) tablet 0.1 mg  0.1 mg Oral BH-qamhs Laverle Hobby, PA-C   0.1 mg at 03/27/16 1017   Followed by  . [START ON 03/28/2016] cloNIDine (CATAPRES) tablet 0.1 mg  0.1 mg Oral QAC breakfast Laverle Hobby, PA-C      . dicyclomine (BENTYL) tablet 20 mg  20 mg Oral Q6H PRN Laverle Hobby, PA-C   20 mg at 03/25/16 0759  . DULoxetine (CYMBALTA) DR capsule 40 mg  40 mg Oral  Daily Aundra Dubin, MD   40 mg at 03/27/16 5102  . gabapentin (NEURONTIN) capsule 600 mg  600 mg Oral [id number] Derrill Center, NP      . hydrOXYzine (ATARAX/VISTARIL) tablet 50 mg  50 mg Oral QID Derrill Center, NP   50 mg at 03/27/16 1153  . loperamide (IMODIUM) capsule 2-4 mg  2-4 mg Oral PRN Laverle Hobby, PA-C      . magnesium hydroxide (MILK OF MAGNESIA) suspension 30 mL  30 mL Oral Daily PRN Laverle Hobby, PA-C      . methocarbamol (ROBAXIN) tablet 500 mg  500 mg Oral Q8H PRN Laverle Hobby, PA-C   500 mg at 03/27/16 5852  . multivitamin with  minerals tablet 1 tablet  1 tablet Oral Daily Aundra Dubin, MD   1 tablet at 03/27/16 8502  . naproxen (NAPROSYN) tablet 500 mg  500 mg Oral Q12H PRN Derrill Center, NP      . OLANZapine zydis (ZYPREXA) disintegrating tablet 5 mg  5 mg Oral TID PRN Derrill Center, NP   5 mg at 03/27/16 1153  . ondansetron (ZOFRAN-ODT) disintegrating tablet 4 mg  4 mg Oral Q6H PRN Laverle Hobby, PA-C   4 mg at 03/25/16 0759  . prazosin (MINIPRESS) capsule 2 mg  2 mg Oral QHS Aundra Dubin, MD   2 mg at 03/26/16 2146  . thiamine (VITAMIN B-1) tablet 100 mg  100 mg Oral Daily Aundra Dubin, MD   100 mg at 03/27/16 0807    Lab Results:  Results for orders placed or performed during the hospital encounter of 03/24/16 (from the past 48 hour(s))  Comprehensive metabolic panel     Status: Abnormal   Collection Time: 03/26/16  6:22 AM  Result Value Ref Range   Sodium 140 135 - 145 mmol/L   Potassium 4.0 3.5 - 5.1 mmol/L   Chloride 105 101 - 111 mmol/L   CO2 27 22 - 32 mmol/L   Glucose, Bld 104 (H) 65 - 99 mg/dL   BUN 19 6 - 20 mg/dL   Creatinine, Ser 0.88 0.44 - 1.00 mg/dL   Calcium 9.1 8.9 - 10.3 mg/dL   Total Protein 7.3 6.5 - 8.1 g/dL   Albumin 3.9 3.5 - 5.0 g/dL   AST 32 15 - 41 U/L   ALT 41 14 - 54 U/L   Alkaline Phosphatase 43 38 - 126 U/L   Total Bilirubin 0.7 0.3 - 1.2 mg/dL   GFR calc non Af Amer >60 >60 mL/min   GFR  calc Af Amer >60 >60 mL/min    Comment: (NOTE) The eGFR has been calculated using the CKD EPI equation. This calculation has not been validated in all clinical situations. eGFR's persistently <60 mL/min signify possible Chronic Kidney Disease.    Anion gap 8 5 - 15    Comment: Performed at [facility], [place] [address], [place], [place] [address]    Blood Alcohol level:  Lab Results  Component Value Date   ETH <5 87/86/7672    Metabolic Disorder Labs: Lab Results  Component Value Date   HGBA1C 5.2 03/24/2016   MPG 103 03/24/2016   Lab Results  Component Value Date   PROLACTIN 54.1 (H) 03/24/2016   Lab Results  Component Value Date   CHOL 164 03/24/2016   TRIG 99 03/24/2016   HDL 47 03/24/2016   CHOLHDL 3.5 03/24/2016   VLDL 20 03/24/2016   LDLCALC 97 03/24/2016    Physical Findings: AIMS: Facial and Oral Movements Muscles of Facial Expression: None, normal Lips and Perioral Area: None, normal Jaw: None, normal Tongue: None, normal,Extremity Movements Upper (arms, wrists, hands, fingers): None, normal Lower (legs, knees, ankles, toes): None, normal, Trunk Movements Neck, shoulders, hips: None, normal, Overall Severity Severity of abnormal movements (highest score from questions above): None, normal Incapacitation due to abnormal movements: None, normal Patient's awareness of abnormal movements (rate only patient's report): No Awareness, Dental Status Current problems with teeth and/or dentures?: No Does patient usually wear dentures?: No  CIWA:  CIWA-Ar Total: 7 COWS:  COWS Total Score: 2  Musculoskeletal: Strength & Muscle Tone: within normal limits Gait & Station: normal Patient leans: N/A  Psychiatric Specialty Exam: Physical Exam  Nursing note and vitals reviewed. Constitutional: She appears well-developed.  Musculoskeletal: Normal range of motion.  Neurological: She is alert.  Skin: Skin is warm and dry.    Review of Systems   Psychiatric/Behavioral: Positive for depression, substance abuse and suicidal ideas. The patient is nervous/anxious.     Blood pressure 99/76, pulse (!) 105, temperature 98.4 F (36.9 C), resp. rate 18, height 5' 1.5" (1.562 m), weight 66.7 kg (147 lb), SpO2 99 %.Body mass index is 27.33 kg/m.  General Appearance: Casual and Fairly Groomed  Eye Contact:  Good  Speech:  Clear and Coherent  Volume:  Normal  Mood:  Anxious  Affect:  Appropriate  Thought Process:  Coherent  Orientation:  Full (Time, Place, and Person)  Thought Content:  Logical  Suicidal Thoughts:  No  Homicidal Thoughts:  No  Memory:  Recent;   Good  Judgement:  Poor  Insight:  Shallow  Psychomotor Activity:  Normal  Concentration:  Concentration: Fair  Recall:  NA  Fund of Knowledge:  Fair  Language:  Fair  Akathisia:  Negative  Handed:  Right  AIMS (if indicated):     Assets:  Ship broker  ADL's:  Intact  Cognition:  WNL  Sleep:  Number of Hours: 4     I agree with current treatment plan on 03/27/2016, Patient seen face-to-face for psychiatric evaluation follow-up, chart reviewed. Reviewed the information documented and agree with the treatment plan.  Treatment Plan Summary: Daily contact with patient to assess and evaluate symptoms and progress in treatment and Medication management   Consulted with Pharmacy regarding medication adjustments/ frequencies 03/27/2016  # Bipolar Disorder, PTSD, Depression - Continue Cymbalta consolidated to 40 mg QAM - Increased  Aripiprazole 2 mg daily to 5 mg PO QAM , goal dose 5-10 mg daily  - Discontinue Seroquel given poor metabolic profile and inadequate coverage for sleep related nightmares - Initiate prazosin 2 mg QHS for sleep; titrate 1-2 mg weekly as tolerated  # Fibromyalgia - Continue Neurontin to 600 mg TID - Continue Cymbalta daily  # Opiate Use Disorder, benzodiazepine use disorder - Continue  detox protocol - Coordinate for appropriate outpatient vs residential treatment options on discharge  # Disposition - Coordinate with SW - CPS contact and remains involved in the patient's case at this time  Derrill Center, NP 03/27/2016, 1:11 PM

## 2016-03-27 NOTE — [facility] Group Notes (Signed)
[facility] Group Notes:  (Nursing/MHT/Case Management/Adjunct)  Date:  03/27/2016  Time:  2:21 PM  Type of Therapy:  Nurse Education  Participation Level:  Did Not Attend   Almira Barenny G Dmoni Fortson 03/27/2016, 2:21 PM

## 2016-03-28 DIAGNOSIS — Z8742 Personal history of other diseases of the female genital tract: Secondary | ICD-10-CM

## 2016-03-28 DIAGNOSIS — F17210 Nicotine dependence, cigarettes, uncomplicated: Secondary | ICD-10-CM

## 2016-03-28 MED ORDER — NICOTINE POLACRILEX 2 MG MT GUM
2.0000 mg | CHEWING_GUM | OROMUCOSAL | Status: DC | PRN
  Administered 2016-03-28 – 2016-03-30 (×6): 2 mg via ORAL
  Filled 2016-03-28: qty 1

## 2016-03-28 NOTE — Progress Notes (Signed)
[facility] MD Progress Note  03/28/2016 4:41 PM Lindsi Bayliss  MRN:  [id number]  Subjective:  Everything is fine. Im ok just dont feel well. Im wanting to leave tomorrow or Tuesday. I just want to take a nap right now that is why Im in my room.   Objective: Scheryl Sanborn is awake, alert and oriented *3 Seen resting in her room, prior to this she was observed in the dayroom with her peers and others. Denies suicidal or homicidal ideation. Denies auditory or visual hallucination and does not appear to be responding to internal stimuli.  Patient is tolerating her medications well at this time with no side effects and medication compliant. Patient  Has signed a 72 hour notice that is up tomorrow at 853 am. She also repots that she would like to go home tomorrow or Tuesday. She is aware that her 72 hour notice is up. Reports good appetite.  Patient reports she is not resting well. Support, encouragement and reassurance was provided.   Principal Problem: Opiate abuse, continuous Diagnosis:   Patient Active Problem List   Diagnosis Date Noted  . Substance induced mood disorder ([facility]) [F19.94] 03/24/2016  . History of 3 spontaneous abortions [Z87.42] 03/24/2016  . Eczema [L30.9] 03/24/2016  . Depression [F32.9] 03/24/2016  . Asthma without status asthmaticus without complication [J45.909] 03/24/2016  . Anxiety [F41.9] 03/24/2016  . Opiate abuse, continuous [F11.10] 03/24/2016  . Fibromyalgia [M79.7] 04/17/2015  . Chronic hepatitis C virus infection ([facility]) [B18.2] 02/28/2015  . Chronic migraine [G43.709] 07/23/2014  . Bipolar disorder ([facility]) [F31.9] 05/10/2013  . PTSD (post-traumatic stress disorder) [F43.10] 05/10/2013  . Substance abuse [F19.10] 10/03/2012   Total Time spent with patient: 45 minutes  Past Psychiatric History: See H&P for full details  Past Medical History:  Past Medical History:  Diagnosis Date  . Anxiety   . Bipolar disorder ([facility])   . Compression fracture of spine ([facility])   .  Fibromyalgia   . Headache   . Seizures ([facility])    Related to benzo withdrawal    Past Surgical History:  Procedure Laterality Date  . HAND SURGERY Right    Family History: History reviewed. No pertinent family history. Family Psychiatric  History: See H&P for full details  Social History:  History  Alcohol Use No     History  Drug Use  . Types: Cocaine, Benzodiazepines, Heroin, Hydrocodone    Comment: Opiods, last used: Last night 3am     Social History   Social History  . Marital status: Single    Spouse name: N/A  . Number of children: N/A  . Years of education: N/A   Social History Main Topics  . Smoking status: Current Every Day Smoker    Packs/day: 1.00    Types: Cigarettes  . Smokeless tobacco: Never Used     Comment: Pt does not want to stop smoking at this time  . Alcohol use No  . Drug use: Yes    Types: Cocaine, Benzodiazepines, Heroin, Hydrocodone     Comment: Opiods, last used: Last night 3am   . Sexual activity: Yes    Birth control/ protection: Injection     Comment: Depo Provera   Other Topics Concern  . None   Social History Narrative  . None   Additional Social History:    Pain Medications: See home med list Prescriptions: See home med list Over the Counter: See home med list History of alcohol / drug use?: Yes Longest period of sobriety (when/how long):  unknown Negative Consequences of Use: Personal relationships, Financial, Work / School Withdrawal Symptoms: Agitation, Irritability, Patient aware of relationship between substance abuse and physical/medical complications, Seizures, Tremors Onset of Seizures: related to benzo withdrawal Date of most recent seizure: summer of 2017 Name of Substance 1: heroin and "roxies" 1 - Age of First Use: unk 1 - Amount (size/oz): varies 1 - Frequency: daily 1 - Duration: ongoing 1 - Last Use / Amount: 03/22/16 Name of Substance 2: xanax 2 - Age of First Use: unk 2 - Amount (size/oz): varies 2 -  Frequency: daily 2 - Duration: ongoing 2 - Last Use / Amount: 03/23/16 Name of Substance 3: cocaine 3 - Age of First Use: unk 3 - Amount (size/oz): varies 3 - Frequency: every 2-3 months 3 - Duration: ongoing 3 - Last Use / Amount: 2/3-03/21/2016 Name of Substance 4: ETOH 4 - Age of First Use: unk 4 - Amount (size/oz): varies-"social drinker" 4 - Frequency: varies-maybe every 2-3 months 4 - Duration: ongoing 4 - Last Use / Amount: Christmas/New Year"s Name of Substance 5: Nicotine 5 - Age of First Use: unk 5 - Amount (size/oz): 1 pack 5 - Frequency: daily 5 - Duration: ongoing 5 - Last Use / Amount: 03/23/16          Sleep: Good  Appetite:  Fair  Current Medications: Current Facility-Administered Medications  Medication Dose Route Frequency Provider Last Rate Last Dose  . acetaminophen (TYLENOL) tablet 650 mg  650 mg Oral Q6H PRN Kerry Hough, PA-C   650 mg at 03/28/16 0951  . albuterol (PROVENTIL HFA;VENTOLIN HFA) 108 (90 Base) MCG/ACT inhaler 1-2 puff  1-2 puff Inhalation Q6H PRN Kerry Hough, PA-C   2 puff at 03/26/16 2305  . alum & mag hydroxide-simeth (MAALOX/MYLANTA) 200-200-20 MG/5ML suspension 30 mL  30 mL Oral Q4H PRN Kerry Hough, PA-C      . ARIPiprazole (ABILIFY) tablet 5 mg  5 mg Oral Daily Oneta Rack, NP   5 mg at 03/28/16 0757  . cloNIDine (CATAPRES) tablet 0.1 mg  0.1 mg Oral QAC breakfast Kerry Hough, PA-C   0.1 mg at 03/28/16 0756  . dicyclomine (BENTYL) tablet 20 mg  20 mg Oral Q6H PRN Kerry Hough, PA-C   20 mg at 03/25/16 0759  . DULoxetine (CYMBALTA) DR capsule 40 mg  40 mg Oral Daily Burnard Leigh, MD   40 mg at 03/28/16 0757  . gabapentin (NEURONTIN) capsule 600 mg  600 mg Oral [id number] Oneta Rack, NP   600 mg at 03/28/16 1506  . hydrOXYzine (ATARAX/VISTARIL) tablet 50 mg  50 mg Oral QID Oneta Rack, NP   50 mg at 03/28/16 1132  . loperamide (IMODIUM) capsule 2-4 mg  2-4 mg Oral PRN Kerry Hough, PA-C      . magnesium  hydroxide (MILK OF MAGNESIA) suspension 30 mL  30 mL Oral Daily PRN Kerry Hough, PA-C      . methocarbamol (ROBAXIN) tablet 500 mg  500 mg Oral Q8H PRN Kerry Hough, PA-C   500 mg at 03/28/16 0951  . multivitamin with minerals tablet 1 tablet  1 tablet Oral Daily Burnard Leigh, MD   1 tablet at 03/28/16 0757  . naproxen (NAPROSYN) tablet 500 mg  500 mg Oral Q12H PRN Oneta Rack, NP      . nicotine polacrilex (NICORETTE) gum 2 mg  2 mg Oral PRN Craige Cotta, MD   2 mg  at 03/28/16 1132  . OLANZapine zydis (ZYPREXA) disintegrating tablet 5 mg  5 mg Oral TID PRN Oneta Rack, NP   5 mg at 03/28/16 1307  . ondansetron (ZOFRAN-ODT) disintegrating tablet 4 mg  4 mg Oral Q6H PRN Kerry Hough, PA-C   4 mg at 03/25/16 0759  . prazosin (MINIPRESS) capsule 2 mg  2 mg Oral QHS Burnard Leigh, MD   2 mg at 03/27/16 2104  . thiamine (VITAMIN B-1) tablet 100 mg  100 mg Oral Daily Burnard Leigh, MD   100 mg at 03/28/16 1610    Lab Results:  No results found for this or any previous visit (from the past 48 hour(s)).  Blood Alcohol level:  Lab Results  Component Value Date   ETH <5 03/23/2016    Metabolic Disorder Labs: Lab Results  Component Value Date   HGBA1C 5.2 03/24/2016   MPG 103 03/24/2016   Lab Results  Component Value Date   PROLACTIN 54.1 (H) 03/24/2016   Lab Results  Component Value Date   CHOL 164 03/24/2016   TRIG 99 03/24/2016   HDL 47 03/24/2016   CHOLHDL 3.5 03/24/2016   VLDL 20 03/24/2016   LDLCALC 97 03/24/2016    Physical Findings: AIMS: Facial and Oral Movements Muscles of Facial Expression: None, normal Lips and Perioral Area: None, normal Jaw: None, normal Tongue: None, normal,Extremity Movements Upper (arms, wrists, hands, fingers): None, normal Lower (legs, knees, ankles, toes): None, normal, Trunk Movements Neck, shoulders, hips: None, normal, Overall Severity Severity of abnormal movements (highest score from questions  above): None, normal Incapacitation due to abnormal movements: None, normal Patient's awareness of abnormal movements (rate only patient's report): No Awareness, Dental Status Current problems with teeth and/or dentures?: No Does patient usually wear dentures?: No  CIWA:  CIWA-Ar Total: 2 COWS:  COWS Total Score: 2  Musculoskeletal: Strength & Muscle Tone: within normal limits Gait & Station: normal Patient leans: N/A  Psychiatric Specialty Exam: Physical Exam  Nursing note and vitals reviewed. Constitutional: She appears well-developed.  Musculoskeletal: Normal range of motion.  Neurological: She is alert.  Skin: Skin is warm and dry.    Review of Systems  Psychiatric/Behavioral: Positive for depression, substance abuse and suicidal ideas. The patient is nervous/anxious.     Blood pressure 126/76, pulse 96, temperature 98.8 F (37.1 C), temperature source Oral, resp. rate 16, height 5' 1.5" (1.562 m), weight 66.7 kg (147 lb), SpO2 99 %.Body mass index is 27.33 kg/m.  General Appearance: Casual and Fairly Groomed  Eye Contact:  Good  Speech:  Clear and Coherent  Volume:  Normal  Mood:  Anxious  Affect:  Appropriate  Thought Process:  Coherent  Orientation:  Full (Time, Place, and Person)  Thought Content:  Logical  Suicidal Thoughts:  No  Homicidal Thoughts:  No  Memory:  Recent;   Good  Judgement:  Poor  Insight:  Shallow  Psychomotor Activity:  Normal  Concentration:  Concentration: Fair  Recall:  NA  Fund of Knowledge:  Fair  Language:  Fair  Akathisia:  Negative  Handed:  Right  AIMS (if indicated):     Assets:  Photographer  ADL's:  Intact  Cognition:  WNL  Sleep:  Number of Hours: 6.75     I agree with current treatment plan on 03/27/2016, Patient seen face-to-face for psychiatric evaluation follow-up, chart reviewed. Reviewed the information documented and agree with the treatment plan.  Treatment  Plan Summary: Daily  contact with patient to assess and evaluate symptoms and progress in treatment and Medication management   Consulted with Pharmacy regarding medication adjustments/ frequencies 03/27/2016  # Bipolar Disorder, PTSD, Depression - Continue Cymbalta consolidated to 40 mg QAM - Continue  Aripiprazole 5 mg PO QAM , goal dose 5-10 mg daily  - Discontinue Seroquel given poor metabolic profile and inadequate coverage for sleep related nightmares - Continue prazosin 2 mg QHS for sleep; titrate 1-2 mg weekly as tolerated  # Fibromyalgia - Continue Neurontin to 600 mg TID - Continue Cymbalta daily  # Opiate Use Disorder, benzodiazepine use disorder - Continue detox protocol - Coordinate for appropriate outpatient vs residential treatment options on discharge  # Disposition - Coordinate with SW - CPS contact and remains involved in the patient's case at this time  Truman Haywardakia S Starkes, FNP 03/28/2016, 4:41 PM

## 2016-03-28 NOTE — Plan of Care (Signed)
Problem: Safety: Goal: Periods of time without injury will increase Outcome: Progressing Safety maintained on unit   

## 2016-03-28 NOTE — Progress Notes (Signed)
D.  Pt pleasant on approach, complaint of anxiety and insomnia.  Pt states she could not sleep at all last night and is hopeful that will change tonight.  Pt did attend evening wrap up group, observed interacting appropriately with peers on the unit.  A.  Support and encouragement offered, medication given as ordered.  R.  Pt remains safe on the unit, will continue to monitor.

## 2016-03-28 NOTE — Progress Notes (Signed)
The patient attended this evening's A.A.meeting and was appropriate.  

## 2016-03-28 NOTE — [facility] Group Notes (Signed)
[facility] LCSW Group Therapy Note   03/28/2016  10:15  AM   Type of Therapy and Topic: Group Therapy: Feelings Around Returning Home & Establishing a Supportive Framework and Activity to Identify signs of Improvement or Decompensation   Participation Level: Varied   Description of Group:  Patients first processed thoughts and feelings about up coming discharge. These included fears of upcoming changes, lack of change, new living environments, judgements and expectations from others and overall stigma of MH issues. We then discussed what is a supportive framework? What does it look like feel like and how do I discern it from and unhealthy non-supportive network? Learn how to cope when supports are not helpful and don't support you. Discuss what to do when your family/friends are not supportive.   Therapeutic Goals Addressed in Processing Group:  1. Patient will identify one healthy supportive network that they can use at discharge. 2. Patient will identify one factor of a supportive framework and how to tell it from an unhealthy network. 3. Patient able to identify one coping skill to use when they do not have positive supports from others. 4. Patient will demonstrate ability to communicate their needs through discussion and/or role plays.  Summary of Patient Progress:  Pt engaged easily at times during group session and also dozed during last portion. As patients processed their anxiety about discharge and described healthy supports patient remarked that she would like to have someone in her support group that was optimistic of her future and had similar experiences.  Patient chose a visual to represent decompensation as 'being stuck' and improvement as 'taking action; maybe even going back to school.'  Carney Bernatherine C Karissa Meenan, LCSW

## 2016-03-28 NOTE — Progress Notes (Signed)
D:  Patient awake and alert; she denies suicidal and homicidal ideation and AVH; no self-injurious behaviors noted or reported. Mood anxious A:  Medications given as scheduled;  Emotional support provided; encouraged her to seek assistance with needs/concerns. R:  Safety maintained on unit.

## 2016-03-29 MED ORDER — ARIPIPRAZOLE 5 MG PO TABS: 5.0000 mg | ORAL_TABLET | Freq: Every day | ORAL | 0 refills | Status: DC

## 2016-03-29 MED ORDER — NICOTINE POLACRILEX 2 MG MT GUM: 2.0000 mg | CHEWING_GUM | OROMUCOSAL | 0 refills | Status: DC | PRN

## 2016-03-29 MED ORDER — ALBUTEROL SULFATE HFA 108 (90 BASE) MCG/ACT IN AERS: 1.0000 | INHALATION_SPRAY | RESPIRATORY_TRACT | Status: DC | PRN

## 2016-03-29 MED ORDER — PRAZOSIN HCL 2 MG PO CAPS: 2.0000 mg | ORAL_CAPSULE | Freq: Every day | ORAL | 0 refills | Status: DC

## 2016-03-29 MED ORDER — METHOCARBAMOL 500 MG PO TABS
500.0000 mg | ORAL_TABLET | Freq: Three times a day (TID) | ORAL | Status: DC | PRN
  Administered 2016-03-29 – 2016-03-30 (×3): 500 mg via ORAL
  Filled 2016-03-29 (×3): qty 1

## 2016-03-29 MED ORDER — DULOXETINE HCL 40 MG PO CPEP: 40.0000 mg | ORAL_CAPSULE | Freq: Every day | ORAL | 0 refills | Status: DC

## 2016-03-29 MED ORDER — GABAPENTIN 300 MG PO CAPS: 600.0000 mg | ORAL_CAPSULE | ORAL | 0 refills | Status: DC

## 2016-03-29 MED ORDER — DULOXETINE HCL 60 MG PO CPEP
60.0000 mg | ORAL_CAPSULE | Freq: Every day | ORAL | Status: DC
  Administered 2016-03-30: 60 mg via ORAL
  Filled 2016-03-29 (×3): qty 1

## 2016-03-29 MED ORDER — PRAZOSIN HCL 1 MG PO CAPS
3.0000 mg | ORAL_CAPSULE | Freq: Every day | ORAL | Status: DC
  Administered 2016-03-29: 3 mg via ORAL
  Filled 2016-03-29 (×3): qty 3

## 2016-03-29 MED ORDER — HYDROXYZINE HCL 50 MG PO TABS: 50.0000 mg | ORAL_TABLET | Freq: Four times a day (QID) | ORAL | 0 refills | Status: DC

## 2016-03-29 NOTE — Progress Notes (Addendum)
[facility] MD Progress Note  03/29/2016 11:23 AM Kathy [name]  MRN:  [id number]  Subjective:  Patient appears much improved in terms of self care, brighter affect and more engaged on the unity.  Signed a 72 hour release over the weekend and then rescinded the request.  She wants to engage in residential treatment and has worked with SW for [facility] referral.  She continues to be somatically occupied with poor insight into mood/affective symptoms and dysregulation.  Discussed the medication changes as below.    She later spoke Clinical research associate, requests to be discharge tomorrow.  She does not want to wait for the [facility].  Spent time exploring this.  Reviewed the numerous providers she has had over the years, and made my recommendation that she wait and go for the [facility] program.  She reports that she wants to do outpatient care at this time, and wants to leave tomorrow.  She denies any Si, Hi, AVH.    Principal Problem: Opiate abuse, continuous Diagnosis:   Patient Active Problem List   Diagnosis Date Noted  . Substance induced mood disorder (HCC) [F19.94] 03/24/2016  . History of 3 spontaneous abortions [Z87.42] 03/24/2016  . Eczema [L30.9] 03/24/2016  . Depression [F32.9] 03/24/2016  . Asthma without status asthmaticus without complication [J45.909] 03/24/2016  . Anxiety [F41.9] 03/24/2016  . Opiate abuse, continuous [F11.10] 03/24/2016  . Fibromyalgia [M79.7] 04/17/2015  . Chronic hepatitis C virus infection (HCC) [B18.2] 02/28/2015  . Chronic migraine [G43.709] 07/23/2014  . Bipolar disorder (HCC) [F31.9] 05/10/2013  . PTSD (post-traumatic stress disorder) [F43.10] 05/10/2013  . Substance abuse [F19.10] 10/03/2012   Total Time spent with patient: 45 minutes  Past Psychiatric History: See H&P for full details  Past Medical History:  Past Medical History:  Diagnosis Date  . Anxiety   . Bipolar disorder (HCC)   . Compression fracture of spine (HCC)   . Fibromyalgia   . Headache   . Seizures (HCC)     Related to benzo withdrawal    Past Surgical History:  Procedure Laterality Date  . HAND SURGERY Right    Family History: History reviewed. No pertinent family history. Family Psychiatric  History: See H&P for full details  Social History:  History  Alcohol Use No     History  Drug Use  . Types: Cocaine, Benzodiazepines, Heroin, Hydrocodone    Comment: Opiods, last used: Last night 3am     Social History   Social History  . Marital status: Single    Spouse name: N/A  . Number of children: N/A  . Years of education: N/A   Social History Main Topics  . Smoking status: Current Every Day Smoker    Packs/day: 1.00    Types: Cigarettes  . Smokeless tobacco: Never Used     Comment: Pt does not want to stop smoking at this time  . Alcohol use No  . Drug use: Yes    Types: Cocaine, Benzodiazepines, Heroin, Hydrocodone     Comment: Opiods, last used: Last night 3am   . Sexual activity: Yes    Birth control/ protection: Injection     Comment: Depo Provera   Other Topics Concern  . None   Social History Narrative  . None   Additional Social History:    Pain Medications: See home med list Prescriptions: See home med list Over the Counter: See home med list History of alcohol / drug use?: Yes Longest period of sobriety (when/how long): unknown Negative Consequences of Use: Personal relationships, Financial,  Work / Programmer, multimediachool Withdrawal Symptoms: Agitation, Irritability, Patient aware of relationship between substance abuse and physical/medical complications, Seizures, Tremors Onset of Seizures: related to benzo withdrawal Date of most recent seizure: summer of 2017 Name of Substance 1: heroin and "roxies" 1 - Age of First Use: unk 1 - Amount (size/oz): varies 1 - Frequency: daily 1 - Duration: ongoing 1 - Last Use / Amount: 03/22/16 Name of Substance 2: xanax 2 - Age of First Use: unk 2 - Amount (size/oz): varies 2 - Frequency: daily 2 - Duration: ongoing 2 - Last Use  / Amount: 03/23/16 Name of Substance 3: cocaine 3 - Age of First Use: unk 3 - Amount (size/oz): varies 3 - Frequency: every 2-3 months 3 - Duration: ongoing 3 - Last Use / Amount: 2/3-03/21/2016 Name of Substance 4: ETOH 4 - Age of First Use: unk 4 - Amount (size/oz): varies-"social drinker" 4 - Frequency: varies-maybe every 2-3 months 4 - Duration: ongoing 4 - Last Use / Amount: Christmas/New Year"s Name of Substance 5: Nicotine 5 - Age of First Use: unk 5 - Amount (size/oz): 1 pack 5 - Frequency: daily 5 - Duration: ongoing 5 - Last Use / Amount: 03/23/16          Sleep: Good  Appetite:  Fair  Current Medications: Current Facility-Administered Medications  Medication Dose Route Frequency Provider Last Rate Last Dose  . acetaminophen (TYLENOL) tablet 650 mg  650 mg Oral Q6H PRN Kerry HoughSpencer E Simon, PA-C   650 mg at 03/29/16 1005  . albuterol (PROVENTIL HFA;VENTOLIN HFA) 108 (90 Base) MCG/ACT inhaler 1-2 puff  1-2 puff Inhalation Q6H PRN Kerry HoughSpencer E Simon, PA-C   2 puff at 03/26/16 2305  . alum & mag hydroxide-simeth (MAALOX/MYLANTA) 200-200-20 MG/5ML suspension 30 mL  30 mL Oral Q4H PRN Kerry HoughSpencer E Simon, PA-C      . ARIPiprazole (ABILIFY) tablet 5 mg  5 mg Oral Daily Oneta Rackanika N Lewis, NP   5 mg at 03/29/16 0750  . [START ON 03/30/2016] DULoxetine (CYMBALTA) DR capsule 60 mg  60 mg Oral Daily Burnard LeighAlexander Arya Dolph Tavano, MD      . gabapentin (NEURONTIN) capsule 600 mg  600 mg Oral [id number] Oneta Rackanika N Lewis, NP   600 mg at 03/29/16 0750  . hydrOXYzine (ATARAX/VISTARIL) tablet 50 mg  50 mg Oral QID Oneta Rackanika N Lewis, NP   50 mg at 03/29/16 0750  . magnesium hydroxide (MILK OF MAGNESIA) suspension 30 mL  30 mL Oral Daily PRN Kerry HoughSpencer E Simon, PA-C      . methocarbamol (ROBAXIN) tablet 500 mg  500 mg Oral Q8H PRN Adonis BrookSheila Agustin, NP   500 mg at 03/29/16 1005  . multivitamin with minerals tablet 1 tablet  1 tablet Oral Daily Burnard LeighAlexander Arya Ludy Messamore, MD   1 tablet at 03/29/16 0750  . naproxen (NAPROSYN) tablet  500 mg  500 mg Oral Q12H PRN Oneta Rackanika N Lewis, NP   500 mg at 03/28/16 2117  . nicotine polacrilex (NICORETTE) gum 2 mg  2 mg Oral PRN Craige CottaFernando A Cobos, MD   2 mg at 03/29/16 0754  . prazosin (MINIPRESS) capsule 3 mg  3 mg Oral QHS Burnard LeighAlexander Arya Quanta Robertshaw, MD      . thiamine (VITAMIN B-1) tablet 100 mg  100 mg Oral Daily Burnard LeighAlexander Arya [facility]el[facility] Violet, MD   100 mg at 03/29/16 0750    Lab Results:  No results found for this or any previous visit (from the past 48 hour(s)).  Blood Alcohol level:  Lab Results  Component Value Date   ETH <5 03/23/2016    Metabolic Disorder Labs: Lab Results  Component Value Date   HGBA1C 5.2 03/24/2016   MPG 103 03/24/2016   Lab Results  Component Value Date   PROLACTIN 54.1 (H) 03/24/2016   Lab Results  Component Value Date   CHOL 164 03/24/2016   TRIG 99 03/24/2016   HDL 47 03/24/2016   CHOLHDL 3.5 03/24/2016   VLDL 20 03/24/2016   LDLCALC 97 03/24/2016    Physical Findings: AIMS: Facial and Oral Movements Muscles of Facial Expression: None, normal Lips and Perioral Area: None, normal Jaw: None, normal Tongue: None, normal,Extremity Movements Upper (arms, wrists, hands, fingers): None, normal Lower (legs, knees, ankles, toes): None, normal, Trunk Movements Neck, shoulders, hips: None, normal, Overall Severity Severity of abnormal movements (highest score from questions above): None, normal Incapacitation due to abnormal movements: None, normal Patient's awareness of abnormal movements (rate only patient's report): No Awareness, Dental Status Current problems with teeth and/or dentures?: No Does patient usually wear dentures?: No  CIWA:  CIWA-Ar Total: 4 COWS:  COWS Total Score: 8  Musculoskeletal: Strength & Muscle Tone: within normal limits Gait & Station: normal Patient leans: N/A  Psychiatric Specialty Exam: Physical Exam  ROS  Blood pressure 121/73, pulse (!) 111, temperature 98.7 F (37.1 C), temperature source Oral, resp. rate 20,  height 5' 1.5" (1.562 m), weight 66.7 kg (147 lb), SpO2 99 %.Body mass index is 27.33 kg/m.  General Appearance: Casual and Well Groomed  Eye Contact:  Good  Speech:  Clear and Coherent  Volume:  Normal  Mood:  Euthymic  Affect:  Appropriate  Thought Process:  Coherent  Orientation:  Full (Time, Place, and Person)  Thought Content:  Logical  Suicidal Thoughts:  No  Homicidal Thoughts:  No  Memory:  Recent;   Good  Judgement:  Poor  Insight:  Shallow  Psychomotor Activity:  Normal  Concentration:  Concentration: Fair  Recall:  NA  Fund of Knowledge:  Fair  Language:  Fair  Akathisia:  Negative  Handed:  Right  AIMS (if indicated):     Assets:  Photographer  ADL's:  Intact  Cognition:  WNL  Sleep:  Number of Hours: 6   Treatment Plan Summary: Daily contact with patient to assess and evaluate symptoms and progress in treatment and Medication management  # Bipolar Disorder, PTSD, Depression - Cymbalta 60 mg daily - Initiate Aripiprazole 5 mg daily; titrated over the weekend - Zyprexa 5 mg initiated at prn over the weekend; unclear indications, will discontinue - Prazosin 3 mg QHS for sleep; titrate 1-2 mg weekly as tolerated  # Fibromyalgia - Increase Neurontin to 600 mg TID  - Continue Cymbalta 60 mg daily   # Opiate Use Disorder, benzodiazepine use disorder - no active signs/symptoms of withdrawal at this time - Taper completed  # Disposition - Coordinate with SW - CPS remains involved in the patient's case at this time  Burnard Leigh, MD 03/29/2016, 11:23 AM

## 2016-03-29 NOTE — Discharge Summary (Signed)
Physician Discharge Summary Note  Patient:  Kathy [name] is an 31 y.o., female MRN:  [id number] DOB:  17-Sep-1985 Patient phone:  [phone] (home)  Patient address:   [address] [place] [address],  Total Time spent with patient: Greater than 30 minutes  Date of Admission:  03/24/2016  Date of Discharge: 03-29-16  Reason for Admission: Request for drug detoxification [name].  Principal Problem: Opiate abuse, continuous  Discharge Diagnoses: Patient Active Problem List   Diagnosis Date Noted  . Substance induced mood disorder (HCC) [F19.94] 03/24/2016  . History of 3 spontaneous abortions [Z87.42] 03/24/2016  . Eczema [L30.9] 03/24/2016  . Depression [F32.9] 03/24/2016  . Asthma without status asthmaticus without complication [J45.909] 03/24/2016  . Anxiety [F41.9] 03/24/2016  . Opiate abuse, continuous [F11.10] 03/24/2016  . Fibromyalgia [M79.7] 04/17/2015  . Chronic hepatitis C virus infection (HCC) [B18.2] 02/28/2015  . Chronic migraine [G43.709] 07/23/2014  . Bipolar disorder (HCC) [F31.9] 05/10/2013  . PTSD (post-traumatic stress disorder) [F43.10] 05/10/2013  . Substance abuse [F19.10] 10/03/2012   Past Psychiatric History: Hx. Opioid use disorder, PTSD, Bipolar affective disorder.  Past Medical History:  Past Medical History:  Diagnosis Date  . Anxiety   . Bipolar disorder (HCC)   . Compression fracture of spine (HCC)   . Fibromyalgia   . Headache   . Seizures (HCC)    Related to benzo withdrawal    Past Surgical History:  Procedure Laterality Date  . HAND SURGERY Right    Family History: History reviewed. No pertinent family history.  Family Psychiatric  History: See H&P  Social History:  History  Alcohol Use No     History  Drug Use  . Types: Cocaine, Benzodiazepines, Heroin, Hydrocodone    Comment: Opiods, last used: Last night 3am     Social History   Social History  . Marital status: Single    Spouse name: N/A  . Number of  children: N/A  . Years of education: N/A   Social History Main Topics  . Smoking status: Current Every Day Smoker    Packs/day: 1.00    Types: Cigarettes  . Smokeless tobacco: Never Used     Comment: Pt does not want to stop smoking at this time  . Alcohol use No  . Drug use: Yes    Types: Cocaine, Benzodiazepines, Heroin, Hydrocodone     Comment: Opiods, last used: Last night 3am   . Sexual activity: Yes    Birth control/ protection: Injection     Comment: Depo Provera   Other Topics Concern  . None   Social History Narrative  . None   Hospital Course: Kathy [name] is a 31 year old female with opiate use disorder, benzodiazepine use disorder, and multiple psychiatric diagnoses including bipolar disorder, who is presently admitted requesting a detox from IV and intranasal heroine.  She was using 1-2 grams of heroine daily per her report. She reports last use was the day before hospital admission.  She has also been using 2-4 mg of xanax daily for the past 5 years, per her report.  She has been buying these off the street, and she has a [place] controlled substance database inquiry suggestive of additional GABA medication use, including valium, lorazepam, and clonazepam in the recent months.  Kathy [name] is only able to partly participate on interview.  She assets that she feels physically awful, she feels nauseous, has headache, feels "sweaty," and her body is aching.  She denies any thoughts to harm  herself.  She wishes to be helped in detoxing and wants to find a longer term inpatient substance treatment program.  She names her children as significant motivators for her pursuit of sobriety. Discussed that we will use benzodiazepine to taper her and reduce the risk of GABA withdrawal, and she agrees to this.  She is aware of the availability of clonidine and zofran, GI upset, chills for opiate withdrawal.   After evaluation of her presenting complaints, chart review showed Kathy [name]'s UDS was  positive for Benzodiazepine, Opioid & Oocaine. She does have hx of mental illness of which she was on medications. Kathy [name] was aware of her chronic drug addiction problems & wanted help to locate a residential substance abuse treatment program to participate in after discharge. She was also requesting drug detoxification treatment. She presented with substance withdrawal symptoms.  Kathy [name] received both clonidine & Librium detoxification treatment protocols to re-stabilize her systems of both Benzodiazepine & Opioid intoxications. She was also enrolled in the group counseling sessions, AA/NA meetings being offered & held on this unit. She participated & learned coping skills that should help her after discharge to cope better & manage her substance abuse issues to maintain sobriety. Her discharge plans included a referral to the [facility] treatment [facility] in [place] for further substance abuse treatment after discharge.  Besides the detoxification [name], Kathy [name] was also medicated & discharged on; Abilify 5 mg for mood control, Duloxetine 60 mg for depression, Gabapentin 300 mg for agitation, Hydroxyzine 50 mg prn for anxiety, Nicotine gum for smoking cessation & Prazosin 3 mg for PTSD symptoms. She presented other health issues that required treatment and or monitoring. She was resumed on all her pertinent home medications for those health issues. Kathy [name] tolerated her treatment regimen without any significant adverse effects and or reactions reported.   Kathy [name] & her mood is stable. She is currently medically & mentally stable to be discharged to continue mental health care on an outpatient basis. She has been referred to the [facility] in [place], [place] to pursue further substance abuse treatment. She was provided with all the necessary information needed to make this appointment without problems.   Upon discharge, Kathy [name] adamantly  denies any suicidal, homicidal ideations, delusional thought, auditory, visual hallucinations and or substance withdrawal symptoms. She left [facility] with all personal belongings in no apparent distress. Transportation per mother.  Physical Findings: AIMS: Facial and Oral Movements Muscles of Facial Expression: None, normal Lips and Perioral Area: None, normal Jaw: None, normal Tongue: None, normal,Extremity Movements Upper (arms, wrists, hands, fingers): None, normal Lower (legs, knees, ankles, toes): None, normal, Trunk Movements Neck, shoulders, hips: None, normal, Overall Severity Severity of abnormal movements (highest score from questions above): None, normal Incapacitation due to abnormal movements: None, normal Patient's awareness of abnormal movements (rate only patient's report): No Awareness, Dental Status Current problems with teeth and/or dentures?: No Does patient usually wear dentures?: No  CIWA:  CIWA-Ar Total: 3 COWS:  COWS Total Score: 6  Musculoskeletal: Strength & Muscle Tone: within normal limits Gait & Station: normal Patient leans: N/A  Psychiatric Specialty Exam: Physical Exam  Constitutional: She appears well-developed and well-nourished.  HENT:  Head: Normocephalic.  Eyes: Pupils are equal, round, and reactive to light.  Neck: Normal range of motion.  Cardiovascular: Normal rate.   Respiratory: Effort normal.  GI: Soft.  Genitourinary:  Genitourinary Comments: Denies any issues in this area  Musculoskeletal: Normal range of motion.  Neurological: She  is alert.  Skin: Skin is warm.    Review of Systems  Constitutional: Negative.   HENT: Negative.   Eyes: Negative.   Respiratory: Negative.   Cardiovascular: Negative.   Gastrointestinal: Negative.   Genitourinary: Negative.   Musculoskeletal: Negative.   Skin: Negative.   Endo/Heme/Allergies: Negative.   Psychiatric/Behavioral: Positive for depression (Stable) and substance abuse (Hx. Polysubstance  use disorder). Negative for hallucinations, memory loss and suicidal ideas. The patient has insomnia (Stable). The patient is not nervous/anxious.     Blood pressure 106/77, pulse 95, temperature 98.1 F (36.7 C), temperature source Oral, resp. rate (!) 183, height 5' 1.5" (1.562 m), weight 66.7 kg (147 lb), SpO2 99 %.Body mass index is 27.33 kg/m.  See Md's SRA   Have you used any form of tobacco in the last 30 days? (Cigarettes, Smokeless Tobacco, Cigars, and/or Pipes): Yes  Has this patient used any form of tobacco in the last 30 days? (Cigarettes, Smokeless Tobacco, Cigars, and/or Pipes): Yes, provided with nicorette gum prescription.   Blood Alcohol level:  Lab Results  Component Value Date   ETH <5 03/23/2016   Metabolic Disorder Labs:  Lab Results  Component Value Date   HGBA1C 5.2 03/24/2016   MPG 103 03/24/2016   Lab Results  Component Value Date   PROLACTIN 54.1 (H) 03/24/2016   Lab Results  Component Value Date   CHOL 164 03/24/2016   TRIG 99 03/24/2016   HDL 47 03/24/2016   CHOLHDL 3.5 03/24/2016   VLDL 20 03/24/2016   LDLCALC 97 03/24/2016   See Psychiatric Specialty Exam and Suicide Risk Assessment completed by Attending Physician prior to discharge.  Discharge destination:  Home  Is patient on multiple antipsychotic therapies at discharge:  No   Has Patient had three or more failed trials of antipsychotic monotherapy by history:  No  Recommended Plan for Multiple Antipsychotic Therapies: NA  Allergies as of 03/30/2016   No Known Allergies     Medication List    STOP taking these medications   clonazePAM 1 MG tablet Commonly known as:  KLONOPIN   metroNIDAZOLE 500 MG tablet Commonly known as:  FLAGYL     TAKE these medications     Indication  albuterol 108 (90 Base) MCG/ACT inhaler Commonly known as:  PROVENTIL HFA;VENTOLIN HFA Inhale 1-2 puffs into the lungs every 4 (four) hours as needed. For shortness of breath What changed:  additional  instructions  Indication:  Asthma   ARIPiprazole 5 MG tablet Commonly known as:  ABILIFY Take 1 tablet (5 mg total) by mouth daily. For mood control  Indication:  Mood control   DULoxetine 60 MG capsule Commonly known as:  CYMBALTA Take 1 capsule (60 mg total) by mouth daily. For depression Start taking on:  03/31/2016  Indication:  Major Depressive Disorder   gabapentin 300 MG capsule Commonly known as:  NEURONTIN Take 2 capsules (600 mg total) by mouth 3 (three) times daily at 8am, 3pm and bedtime. For agitation What changed:  how much to take  when to take this  additional instructions  Indication:  Agitation   hydrOXYzine 50 MG tablet Commonly known as:  ATARAX/VISTARIL Take 1 tablet (50 mg total) by mouth 4 (four) times daily. For anxiety  Indication:  Anxiety Neurosis   nicotine polacrilex 2 MG gum Commonly known as:  NICORETTE Take 1 each (2 mg total) by mouth as needed for smoking cessation.  Indication:  Nicotine Addiction   prazosin 1 MG capsule Commonly known as:  MINIPRESS Take 3 capsules (3 mg total) by mouth at bedtime. For PTSD related nightmare  Indication:  PTSD related nightmares      Follow-up Information    [facility] Follow up.   Why:  Referral faxed: 03/26/16 ATTNDrema Pry: Shayla in Admissions.  Contact information: [address] [place], [place] [address] Phone: [phone] Fax: [phone]       [facility]. Go to.   Why:  Walk in appointment for follow up: medication/therapy.  You can walk in 9am-4pm daily (M-F) Please bring your hospital paperwork. Contact information: [address], [place], [place] [address] Phone: [phone]       [facility] Follow up on 03/30/2016.   Why:  This is for outpatient appointment and intake will call you with information for SA[facility]IOP program: [facility].  Referral has been completed. Contact information: [facility] [phone]           Follow-up recommendations: Activity:  As tolerated Diet: As recommended by your primary care doctor. Keep all scheduled follow-up appointments as recommended.   Comments: Patient is instructed prior to discharge to: Take all medications as prescribed by his/her mental healthcare provider. Report any adverse effects and or reactions from the medicines to his/her outpatient provider promptly. Patient has been instructed & cautioned: To not engage in alcohol and or illegal drug use while on prescription medicines. In the event of worsening symptoms, patient is instructed to call the crisis hotline, 911 and or go to the nearest ED for appropriate evaluation and treatment of symptoms. To follow-up with his/her primary care provider for your other medical issues, concerns and or health care needs.   Signed: Sanjuana KavaNwoko, Agnes I, NP, PMHNP, FNP-BC 03/30/2016, 2:40 PM

## 2016-03-29 NOTE — Progress Notes (Signed)
D.  Pt anxious on approach, states she has felt tremulous all day.  Pt states that zyprexa makes her sleepy but does not help with her anxiety.  Pt would like to speak to doctor about another course of action for her anxiety.  Pt denies SI/HI/hallucinations at this time.  Pt rescinded her 72 hour request for discharge and would like to get into long term treatment from here.  Pt does not feel her withdrawal is yet under control.  Pt denies SI/HI/hallucinations at this time.  A.  Support and encouragement offered, medication given as ordered.  R.  Pt remains safe on the unit, will continue to monitor.

## 2016-03-29 NOTE — Tx Team (Signed)
Interdisciplinary Treatment and Diagnostic Plan Update  03/29/2016 Time of Session: 9:30AM Kathy [name] MRN: [id number]  Principal Diagnosis: Opiate abuse, continuous  Secondary Diagnoses: Principal Problem:   Opiate abuse, continuous Active Problems:   Substance induced mood disorder (HCC)   Current Medications:  Current Facility-Administered Medications  Medication Dose Route Frequency Provider Last Rate Last Dose  . acetaminophen (TYLENOL) tablet 650 mg  650 mg Oral Q6H PRN Kerry Hough, PA-C   650 mg at 03/29/16 1005  . albuterol (PROVENTIL HFA;VENTOLIN HFA) 108 (90 Base) MCG/ACT inhaler 1-2 puff  1-2 puff Inhalation Q6H PRN Kerry Hough, PA-C   2 puff at 03/26/16 2305  . alum & mag hydroxide-simeth (MAALOX/MYLANTA) 200-200-20 MG/5ML suspension 30 mL  30 mL Oral Q4H PRN Kerry Hough, PA-C      . ARIPiprazole (ABILIFY) tablet 5 mg  5 mg Oral Daily Oneta Rack, NP   5 mg at 03/29/16 0750  . [START ON 03/30/2016] DULoxetine (CYMBALTA) DR capsule 60 mg  60 mg Oral Daily Burnard Leigh, MD      . gabapentin (NEURONTIN) capsule 600 mg  600 mg Oral [id number] Oneta Rack, NP   600 mg at 03/29/16 0750  . hydrOXYzine (ATARAX/VISTARIL) tablet 50 mg  50 mg Oral QID Oneta Rack, NP   50 mg at 03/29/16 1209  . magnesium hydroxide (MILK OF MAGNESIA) suspension 30 mL  30 mL Oral Daily PRN Kerry Hough, PA-C      . methocarbamol (ROBAXIN) tablet 500 mg  500 mg Oral Q8H PRN Adonis Brook, NP   500 mg at 03/29/16 1005  . multivitamin with minerals tablet 1 tablet  1 tablet Oral Daily Burnard Leigh, MD   1 tablet at 03/29/16 0750  . naproxen (NAPROSYN) tablet 500 mg  500 mg Oral Q12H PRN Oneta Rack, NP   500 mg at 03/28/16 2117  . nicotine polacrilex (NICORETTE) gum 2 mg  2 mg Oral PRN Craige Cotta, MD   2 mg at 03/29/16 0754  . prazosin (MINIPRESS) capsule 3 mg  3 mg Oral QHS Burnard Leigh, MD      . thiamine (VITAMIN B-1) tablet 100 mg  100 mg Oral  Daily Burnard Leigh, MD   100 mg at 03/29/16 0750   PTA Medications: Prescriptions Prior to Admission  Medication Sig Dispense Refill Last Dose  . clonazePAM (KLONOPIN) 1 MG tablet Take 1 tablet by mouth 3 (three) times daily.   Past Week at Unknown time  . gabapentin (NEURONTIN) 300 MG capsule Take 1 capsule by mouth 2 (two) times daily.   Past Week at Unknown time  . metroNIDAZOLE (FLAGYL) 500 MG tablet Take 500 mg by mouth 2 (two) times daily.   Past Week at Unknown time  . [DISCONTINUED] albuterol (PROVENTIL HFA;VENTOLIN HFA) 108 (90 Base) MCG/ACT inhaler Inhale 1-2 puffs into the lungs every 4 (four) hours as needed.   03/24/2016 at Unknown time    Patient Stressors: Financial difficulties Health problems Medication change or noncompliance Substance abuse  Patient Strengths: Average or above average intelligence Capable of independent living General fund of knowledge Motivation for treatment/growth Supportive family/friends  Treatment Modalities: Medication Management, Group therapy, Case management,  1 to 1 session with clinician, Psychoeducation, Recreational therapy.   Physician Treatment Plan for Primary Diagnosis: Opiate abuse, continuous Long Term Goal(s): Improvement in symptoms so as ready for discharge Improvement in symptoms so as ready for discharge   Short Term Goals:  Ability to identify changes in lifestyle to reduce recurrence of condition will improve Ability to demonstrate self-control will improve Ability to identify and develop effective coping behaviors will improve Compliance with prescribed medications will improve Ability to identify triggers associated with substance abuse/mental health issues will improve Ability to identify changes in lifestyle to reduce recurrence of condition will improve Ability to identify and develop effective coping behaviors will improve Ability to maintain clinical measurements within normal limits will  improve Compliance with prescribed medications will improve Ability to identify triggers associated with substance abuse/mental health issues will improve  Medication Management: Evaluate patient's response, side effects, and tolerance of medication regimen.  Therapeutic Interventions: 1 to 1 sessions, Unit Group sessions and Medication administration.  Evaluation of Outcomes: Progressing  Physician Treatment Plan for Secondary Diagnosis: Principal Problem:   Opiate abuse, continuous Active Problems:   Substance induced mood disorder (HCC)  Long Term Goal(s): Improvement in symptoms so as ready for discharge Improvement in symptoms so as ready for discharge   Short Term Goals: Ability to identify changes in lifestyle to reduce recurrence of condition will improve Ability to demonstrate self-control will improve Ability to identify and develop effective coping behaviors will improve Compliance with prescribed medications will improve Ability to identify triggers associated with substance abuse/mental health issues will improve Ability to identify changes in lifestyle to reduce recurrence of condition will improve Ability to identify and develop effective coping behaviors will improve Ability to maintain clinical measurements within normal limits will improve Compliance with prescribed medications will improve Ability to identify triggers associated with substance abuse/mental health issues will improve     Medication Management: Evaluate patient's response, side effects, and tolerance of medication regimen.  Therapeutic Interventions: 1 to 1 sessions, Unit Group sessions and Medication administration.  Evaluation of Outcomes: Progressing   RN Treatment Plan for Primary Diagnosis: Opiate abuse, continuous Long Term Goal(s): Knowledge of disease and therapeutic regimen to maintain health will improve  Short Term Goals: Ability to remain free from injury will improve, Ability to  verbalize feelings will improve and Ability to disclose and discuss suicidal ideas  Medication Management: RN will administer medications as ordered by provider, will assess and evaluate patient's response and provide education to patient for prescribed medication. RN will report any adverse and/or side effects to prescribing provider.  Therapeutic Interventions: 1 on 1 counseling sessions, Psychoeducation, Medication administration, Evaluate responses to treatment, Monitor vital signs and CBGs as ordered, Perform/monitor CIWA, COWS, AIMS and Fall Risk screenings as ordered, Perform wound care treatments as ordered.  Evaluation of Outcomes: Progressing   LCSW Treatment Plan for Primary Diagnosis: Opiate abuse, continuous Long Term Goal(s): Safe transition to appropriate next level of care at discharge, Engage patient in therapeutic group addressing interpersonal concerns.  Short Term Goals: Engage patient in aftercare planning with referrals and resources, Facilitate patient progression through stages of change regarding substance use diagnoses and concerns and Identify triggers associated with mental health/substance abuse issues  Therapeutic Interventions: Assess for all discharge needs, 1 to 1 time with Social worker, Explore available resources and support systems, Assess for adequacy in community support network, Educate family and significant other(s) on suicide prevention, Complete Psychosocial Assessment, Interpersonal group therapy.  Evaluation of Outcomes: Progressing   Progress in Treatment: Attending groups: No. Participating in groups: No.New to unit. Continuing to assess.  Taking medication as prescribed: Yes. Toleration medication: Yes. Family/Significant other contact made: No, will contact:  family member if patient consents.  Patient understands diagnosis: Yes. Discussing  patient identified problems/goals with staff: Yes. Medical problems stabilized or resolved:  Yes. Denies suicidal/homicidal ideation: Yes. Issues/concerns per patient self-inventory: No. Other: n/a  New problem(s) identified: No, Describe:  n/a  New Short Term/Long Term Goal(s): medication stabilization; detox; development of comprehensive mental wellness/sobriety plan.   Discharge Plan or Barriers: CSW assessing for appropriate referrals. No current outpatient providers. Stopped taking psychiatric medications in Dec 2017. First hospitalization  2/12:  Patient referred to [facility], awaiting response from facility  Reason for Continuation of Hospitalization: Anxiety Depression Mania Medication stabilization Withdrawal symptoms  Estimated Length of Stay: 3-5 days   Attendees: Patient: 03/29/2016 12:42 PM  Physician: Lezlie Octave MD 03/29/2016 12:42 PM  Nursing:  03/29/2016 12:42 PM  RN Care Manager: Onnie Boer CM 03/29/2016 12:42 PM  Social Worker: Vernie Shanks, LCSW 03/29/2016 12:42 PM  Recreational Therapist:  03/29/2016 12:42 PM  Other: Gray Bernhardt NP; Armandina Stammer NP 03/29/2016 12:42 PM  Other:  03/29/2016 12:42 PM  Other: 03/29/2016 12:42 PM    Scribe for Treatment Team: Sallee Lange, LCSW 03/29/2016 12:42 PM

## 2016-03-29 NOTE — Progress Notes (Signed)
Recreation Therapy Notes  Date: 03/29/16 Time: 0930 Location: [address] Group Room  Group Topic: Stress Management  Goal Area(s) Addresses:  Patient will verbalize importance of using healthy stress management.  Patient will identify positive emotions associated with healthy stress management.   Intervention: Stress Management  Activity :  Meditation.  LRT played a meditation on how to deal with stress from the Calm app.  Patients were to follow along as the meditation played.  Education:  Stress Management, Discharge Planning.   Education Outcome: Acknowledges edcuation/In group clarification offered/Needs additional education  Clinical Observations/Feedback: Pt did not attend group.   Katlen Seyer, LRT/CTRS         Debbera Wolken A 03/29/2016 1:05 PM 

## 2016-03-29 NOTE — Progress Notes (Signed)
Patient ID: Kathy [name], female   DOB: 12/13/1985, 31 y.o.   MRN: [id number]  DAR: Pt. Denies SI/HI and A/V Hallucinations. She refuses to fill out her daily inventory sheet stating, "man I ain't fillin' that shit out. I'm done." Patient does report pain in back and received PRN medications. Support and encouragement provided to the patient however patient is not receptive at this time. She has little insight into her mental health at this time stating, "I'm not depressed I'm just getting off drugs." Patient irritable and verbally aggressive. She was upset when items were removed from patient's room that is considered contraband. She is guarded with the Clinical research associatewriter as the day progresses. She is seen in the milieu and is interacting with her peers. Q15 minute checks are maintained for safety.

## 2016-03-29 NOTE — Progress Notes (Signed)
[facility] Group Notes:  (Nursing/MHT/Case Management/Adjunct)  Date:  03/29/2016  Time:  11:19 PM  Type of Therapy:  Psychoeducational Skills  Participation Level:  Active  Participation Quality:  Appropriate  Affect:  Defensive  Cognitive:  Appropriate  Insight:  Limited  Engagement in Group:  Improving  Modes of Intervention:  Education  Summary of Progress/Problems: Patient described her day as having been "all right". She states that she will be discharged tomorrow at 1:00p.m and will be picked up by her mother. She was unable to state what her discharge plans are going to be and remarked that she should receive more assistance. In terms of the theme for the day, her wellness strategy will be to be healthy. She alluded to the fact that she would like to go to a long-term treatment center.   Hazle CocaGOODMAN, Agripina Guyette S 03/29/2016, 11:19 PM

## 2016-03-29 NOTE — [facility] Group Notes (Signed)
Pt attended spiritual care group on grief and loss facilitated by chaplain Burnis KingfisherMatthew Korion Cuevas   Group opened with brief discussion and psycho-social ed around grief and loss in relationships and in relation to self - identifying life patterns, circumstances, changes that cause losses. Established group norm of speaking from own life experience. Group goal of establishing open and affirming space for members to share loss and experience with grief, normalize grief experience and provide psycho social education and grief support.   Kathy [name] was present throughout group.  Attentive to group discussion, she did not contribute verbally to group.       Belva CromeStalnaker, Arturo Freundlich Wayne MDiv

## 2016-03-29 NOTE — Plan of Care (Signed)
Problem: Activity: Goal: Interest or engagement in activities will improve Outcome: Progressing Pt has attended evening groups this weekend   

## 2016-03-29 NOTE — Progress Notes (Signed)
Patient ID: Kathy [name], female   DOB: 25-Aug-1985, 31 y.o.   MRN: [id number]   Pt currently presents with a flat affect and anxious behavior. Pt reports to Clinical research associatewriter that their goal is to "go home tomorrow." Pt concerned that she will not be able to leave because her [facility] her up. Pt reports to writer with a frown, "I was trying to get into a place but another person got a call back and I didn't so screw it." Pt reports good sleep with current medication regimen. Protective factor includes her children.   Pt provided with medications per providers orders. Pt's labs and vitals were monitored throughout the night. Pt given a 1:1 about emotional and mental status. Pt supported and encouraged to express concerns and questions. Pt educated on medications. Encouraged to seek continuing care post discharge for substance abuse.   Pt's safety ensured with 15 minute and environmental checks. Pt currently denies SI/HI and A/V hallucinations. Pt verbally agrees to seek staff if SI/HI or A/VH occurs and to consult with staff before acting on any harmful thoughts. Pt remains restless and fidgety tonight. Will continue POC.

## 2016-03-29 NOTE — [facility] Group Notes (Signed)
[facility] LCSW Group Therapy  03/29/2016 1:30 to 2:30 PM  Type of Therapy:  Group Therapy Overcoming Obstacles   Participation Level:  Minimal  Participation Quality:  Inattentive and Resistant  Affect:  Depressed and Flat  Cognitive:  Oriented  Insight:  None noted  Engagement in Therapy:  None shared  Modes of Intervention:  Clarification, Discussion, Problem-solving, Rapport Building, Socialization and Support  Summary of Progress/Problems:  Group discussion focused on what patient's see as their own obstacles to recovery. Multiple patients shared frustrations they experience with "the system" and what they see as injustices.Patient was in and out of group room and required redirection to avoid side conversations.    Carney Bernatherine C Harrill, LCSW

## 2016-03-30 MED ORDER — DULOXETINE HCL 60 MG PO CPEP: 60.0000 mg | ORAL_CAPSULE | Freq: Every day | ORAL | 0 refills | Status: DC

## 2016-03-30 MED ORDER — PRAZOSIN HCL 1 MG PO CAPS: 3.0000 mg | ORAL_CAPSULE | Freq: Every day | ORAL | 0 refills | Status: DC

## 2016-03-30 NOTE — [facility] Suicide Risk Assessment (Signed)
[facility] INPATIENT:  Family/Significant Other Suicide Prevention Education  Suicide Prevention Education:  Education Completed; Marita SnellenKay Moore,  (name of family member/significant other) has been identified by the patient as the family member/significant other with whom the patient will be residing, and identified as the person(s) who will aid the patient in the event of a mental health crisis (suicidal ideations/suicide attempt).  With written consent from the patient, the family member/significant other has been provided the following suicide prevention education, prior to the and/or following the discharge of the patient.  The suicide prevention education provided includes the following:  Suicide risk factors  Suicide prevention and interventions  National Suicide Hotline telephone number  [facility] assessment telephone number  [facility] Emergency Assistance 911  [facility] and/or Residential Mobile Crisis Unit telephone number  Request made of family/significant other to:  Remove weapons (e.g., guns, rifles, knives), all items previously/currently identified as safety concern.    Remove drugs/medications (over-the-counter, prescriptions, illicit drugs), all items previously/currently identified as a safety concern.  The family member/significant other verbalizes understanding of the suicide prevention education information provided.  The family member/significant other agrees to remove the items of safety concern listed above.  Raye SorrowCoble, Nylah Butkus N 03/30/2016, 12:13 PM

## 2016-03-30 NOTE — Progress Notes (Signed)
D: Patient up and visible in the milieu. Spoke with patient 1:1 who immediately states, "I'm leaving at 1pm. My mom is coming." Denying depression, anxiety. States goal for today is to "leave." Reports chronic generalized pain (from fibromyalgia) of a 6/10. Requesting prn meds.  A: Medicated per orders, tylenol and robaxin prn given. Emotional support offered and self inventory completion encouraged. Asked to compelte Suicide Safety Plan however patient reluctant. Discussed POC with MD, SW.  Fall p/c in place and reviewed.   R: Patient verbalizes understanding of POC. On reassess, patient rating pain of a 2/10. Patient did complete SSP and copy provided to patient. Patient denies SI/HI and remains safe on level III obs. Awaiting discharge order.

## 2016-03-30 NOTE — Progress Notes (Signed)
Patient verbalizes readiness for discharge. Follow up plan explained, AVS, transition record and SRA given along with prescriptions. All belongings returned. Patient verbalizes understanding. Denies SI/HI and assures this Clinical research associatewriter she will seek assistance should that change. Patient discharged ambulatory and in stable condition to mother.

## 2016-03-30 NOTE — Progress Notes (Signed)
  [facility] Adult Case Management Discharge Plan :  Will you be returning to the same living situation after discharge:  Yes,  will return to her apartment or with her mother At discharge, do you have transportation home?: Yes,  mother will come and pick her up Do you have the ability to pay for your medications: Yes,  patient has appointment  Release of information consent forms completed and in the chart;  Patient's signature needed at discharge.  Patient to Follow up at: Follow-up Information    [facility] Follow up.   Why:  Referral faxed: 03/26/16 ATTNDrema Pry: Kathy [name] in Admissions.  Contact information: [address] [place], [place] [address] Phone: [phone] Fax: [phone]       [facility]. Go to.   Why:  Walk in appointment for follow up: medication/therapy.  You can walk in 9am-4pm daily (M-F) Please bring your hospital paperwork. Contact information: [address], [place], [place] [address] Phone: [phone]       [facility] Follow up on 03/30/2016.   Why:  This is for outpatient appointment and intake will call you with information for SA-IOP program: [facility].  Referral has been completed. Contact information: [facility] [phone]           Next level of care provider has access to [facility] Link:yes  Safety Planning and Suicide Prevention discussed: Yes,  completed with patient and mom  Have you used any form of tobacco in the last 30 days? (Cigarettes, Smokeless Tobacco, Cigars, and/or Pipes): Yes  Has patient been referred to the Quitline?: Patient refused referral  Patient has been referred for addiction treatment: Yes   LCSW discussed options with patient regarding discharge.  Patient hopeful for [facility] and will follow up with calling daily for bed. LCSW also made referral [facility] for patient to follow up with intake appointment [facility] for [facility].  We are still  awaiting an appointment date. Lastly, she was [facility]/therapy and was given information about program.  She was given residental treatment options if willing to fill out applications:  [name] [facility], [place]  She was not willing to obtain referral to [facility] as she reports she has had a bad experience in the past.  [facility] was also contacted, and message left by patient.  Kathy [name], Kathy [name] 03/30/2016, 12:09 PM

## 2016-03-30 NOTE — Tx Team (Signed)
Interdisciplinary Treatment and Diagnostic Plan Update  03/30/2016 Time of Session: 9:30AM Kathy [name] MRN: [id number]  Principal Diagnosis: Opiate abuse, continuous  Secondary Diagnoses: Principal Problem:   Opiate abuse, continuous Active Problems:   Substance induced mood disorder (HCC)   Current Medications:  Current Facility-Administered Medications  Medication Dose Route Frequency Provider Last Rate Last Dose  . acetaminophen (TYLENOL) tablet 650 mg  650 mg Oral Q6H PRN Laverle Hobby, PA-C   650 mg at 03/30/16 0800  . albuterol (PROVENTIL HFA;VENTOLIN HFA) 108 (90 Base) MCG/ACT inhaler 1-2 puff  1-2 puff Inhalation Q6H PRN Laverle Hobby, PA-C   2 puff at 03/26/16 2305  . alum & mag hydroxide-simeth (MAALOX/MYLANTA) 200-200-20 MG/5ML suspension 30 mL  30 mL Oral Q4H PRN Laverle Hobby, PA-C      . ARIPiprazole (ABILIFY) tablet 5 mg  5 mg Oral Daily Derrill Center, NP   5 mg at 03/30/16 0758  . DULoxetine (CYMBALTA) DR capsule 60 mg  60 mg Oral Daily Aundra Dubin, MD   60 mg at 03/30/16 0758  . gabapentin (NEURONTIN) capsule 600 mg  600 mg Oral [id number] Derrill Center, NP   600 mg at 03/30/16 0759  . hydrOXYzine (ATARAX/VISTARIL) tablet 50 mg  50 mg Oral QID Derrill Center, NP   50 mg at 03/30/16 0759  . magnesium hydroxide (MILK OF MAGNESIA) suspension 30 mL  30 mL Oral Daily PRN Laverle Hobby, PA-C      . methocarbamol (ROBAXIN) tablet 500 mg  500 mg Oral Q8H PRN Kerrie Buffalo, NP   500 mg at 03/30/16 0801  . multivitamin with minerals tablet 1 tablet  1 tablet Oral Daily Aundra Dubin, MD   1 tablet at 03/30/16 4098  . naproxen (NAPROSYN) tablet 500 mg  500 mg Oral Q12H PRN Derrill Center, NP   500 mg at 03/28/16 2117  . nicotine polacrilex (NICORETTE) gum 2 mg  2 mg Oral PRN Jenne Campus, MD   2 mg at 03/30/16 0801  . prazosin (MINIPRESS) capsule 3 mg  3 mg Oral QHS Aundra Dubin, MD   3 mg at 03/29/16 2109  . thiamine (VITAMIN B-1) tablet  100 mg  100 mg Oral Daily Aundra Dubin, MD   100 mg at 03/30/16 1191   PTA Medications: Prescriptions Prior to Admission  Medication Sig Dispense Refill Last Dose  . clonazePAM (KLONOPIN) 1 MG tablet Take 1 tablet by mouth 3 (three) times daily.   Past Week at Unknown time  . gabapentin (NEURONTIN) 300 MG capsule Take 1 capsule by mouth 2 (two) times daily.   Past Week at Unknown time  . metroNIDAZOLE (FLAGYL) 500 MG tablet Take 500 mg by mouth 2 (two) times daily.   Past Week at Unknown time  . [DISCONTINUED] albuterol (PROVENTIL HFA;VENTOLIN HFA) 108 (90 Base) MCG/ACT inhaler Inhale 1-2 puffs into the lungs every 4 (four) hours as needed.   03/24/2016 at Unknown time    Patient Stressors: Financial difficulties Health problems Medication change or noncompliance Substance abuse  Patient Strengths: Average or above average intelligence Capable of independent living General fund of knowledge Motivation for treatment/growth Supportive family/friends  Treatment Modalities: Medication Management, Group therapy, Case management,  1 to 1 session with clinician, Psychoeducation, Recreational therapy.   Physician Treatment Plan for Primary Diagnosis: Opiate abuse, continuous Long Term Goal(s): Improvement in symptoms so as ready for discharge Improvement in symptoms so as ready for discharge  Short Term Goals: Ability to identify changes in lifestyle to reduce recurrence of condition will improve Ability to demonstrate self-control will improve Ability to identify and develop effective coping behaviors will improve Compliance with prescribed medications will improve Ability to identify triggers associated with substance abuse/mental health issues will improve Ability to identify changes in lifestyle to reduce recurrence of condition will improve Ability to identify and develop effective coping behaviors will improve Ability to maintain clinical measurements within normal limits  will improve Compliance with prescribed medications will improve Ability to identify triggers associated with substance abuse/mental health issues will improve  Medication Management: Evaluate patient's response, side effects, and tolerance of medication regimen.  Therapeutic Interventions: 1 to 1 sessions, Unit Group sessions and Medication administration.  Evaluation of Outcomes: Adequate for Discharge  Physician Treatment Plan for Secondary Diagnosis: Principal Problem:   Opiate abuse, continuous Active Problems:   Substance induced mood disorder ([place])  Long Term Goal(s): Improvement in symptoms so as ready for discharge Improvement in symptoms so as ready for discharge   Short Term Goals: Ability to identify changes in lifestyle to reduce recurrence of condition will improve Ability to demonstrate self-control will improve Ability to identify and develop effective coping behaviors will improve Compliance with prescribed medications will improve Ability to identify triggers associated with substance abuse/mental health issues will improve Ability to identify changes in lifestyle to reduce recurrence of condition will improve Ability to identify and develop effective coping behaviors will improve Ability to maintain clinical measurements within normal limits will improve Compliance with prescribed medications will improve Ability to identify triggers associated with substance abuse/mental health issues will improve     Medication Management: Evaluate patient's response, side effects, and tolerance of medication regimen.  Therapeutic Interventions: 1 to 1 sessions, Unit Group sessions and Medication administration.  Evaluation of Outcomes: Adequate for Discharge   RN Treatment Plan for Primary Diagnosis: Opiate abuse, continuous Long Term Goal(s): Knowledge of disease and therapeutic regimen to maintain health will improve  Short Term Goals: Ability to remain free from injury  will improve, Ability to verbalize feelings will improve and Ability to disclose and discuss suicidal ideas  Medication Management: RN will administer medications as ordered by provider, will assess and evaluate patient's response and provide education to patient for prescribed medication. RN will report any adverse and/or side effects to prescribing provider.  Therapeutic Interventions: 1 on 1 counseling sessions, Psychoeducation, Medication administration, Evaluate responses to treatment, Monitor vital signs and CBGs as ordered, Perform/monitor CIWA, COWS, AIMS and Fall Risk screenings as ordered, Perform wound care treatments as ordered.  Evaluation of Outcomes: Adequate for Discharge   LCSW Treatment Plan for Primary Diagnosis: Opiate abuse, continuous Long Term Goal(s): Safe transition to appropriate next level of care at discharge, Engage patient in therapeutic group addressing interpersonal concerns.  Short Term Goals: Engage patient in aftercare planning with referrals and resources, Facilitate patient progression through stages of change regarding substance use diagnoses and concerns and Identify triggers associated with mental health/substance abuse issues  Therapeutic Interventions: Assess for all discharge needs, 1 to 1 time with Social worker, Explore available resources and support systems, Assess for adequacy in community support network, Educate family and significant other(s) on suicide prevention, Complete Psychosocial Assessment, Interpersonal group therapy.  Evaluation of Outcomes: Adequate for Discharge   Progress in Treatment: Attending groups: Yes. Participating in groups: Yes.Taking medication as prescribed: Yes. Toleration medication: Yes. Family/Significant other contact made: Yes, individual(s) contacted:  mother: Arabella Merles Patient understands diagnosis: Yes.  Discussing patient identified problems/goals with staff: Yes. Medical problems stabilized or resolved:  Yes. Denies suicidal/homicidal ideation: Yes. Issues/concerns per patient self-inventory: No. Other: n/a  New problem(s) identified: No, Describe:  n/a  New Short Term/Long Term Goal(s): medication stabilization; detox; development of comprehensive mental wellness/sobriety plan.   Discharge Plan or Barriers: CSW assessing for appropriate referrals. No current outpatient providers. Stopped taking psychiatric medications in Dec 2017. First hospitalization  2/12:  [facility], awaiting response from facility 2.13: Referred patient to [facility] for continued follow [place]   Reason for Continuation of Hospitalization: goals met.  Estimated Length of Stay: 3-5 days   Attendees: Patient: 03/30/2016 12:01 PM  Physician: Germaine Pomfret MD 03/30/2016 12:01 PM  Nursing: Rise Paganini, RN 03/30/2016 12:01 PM  RN Care Manager: Lars Pinks CM 03/30/2016 12:01 PM  Social Worker:Briggs Edelen N Wlliam Grosso, LCSW 03/30/2016 12:01 PM  Recreational Therapist:  03/30/2016 12:01 PM  Other: Samuel Jester NP; Lindell Spar NP 03/30/2016 12:01 PM  Other:  03/30/2016 12:01 PM  Other: 03/30/2016 12:01 PM    Scribe for Treatment Team: Lilly Cove, LCSW 03/30/2016 12:01 PM
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
## 2016-03-30 NOTE — [facility] Group Notes (Signed)
[facility] Group Notes:  (Nursing/MHT/Case Management/Adjunct)  Date:  03/30/2016  Time:  0900  Type of Therapy:  Nurse Education  Participation Level:  Minimal  Participation Quality:  Attentive  Affect:  Anxious  Cognitive:  Alert  Insight:  Lacking  Engagement in Group:  Lacking  Modes of Intervention:  Education  Summary of Progress/Problems: Patient attended group.  Merian CapronFriedman, Walfred B[facility]f [facility] 03/30/2016, 0930

## 2016-11-25 DIAGNOSIS — F332 Major depressive disorder, recurrent severe without psychotic features: Secondary | ICD-10-CM | POA: Insufficient documentation

## 2016-11-29 ENCOUNTER — Emergency Department (HOSPITAL_COMMUNITY)
Admission: EM | Admit: 2016-11-29 | Discharge: 2016-11-30 | Disposition: A | Discharge status: Other Acute Inpatient Hospital | Payer: Self-pay | Attending: Emergency Medicine | Admitting: Emergency Medicine

## 2016-11-29 ENCOUNTER — Ambulatory Visit (HOSPITAL_COMMUNITY)
Admission: RE | Admit: 2016-11-29 | Discharge: 2016-11-29 | Disposition: A | Discharge status: Home / Self Care | Payer: Self-pay | Attending: Psychiatry | Admitting: Psychiatry

## 2016-11-29 ENCOUNTER — Encounter (HOSPITAL_COMMUNITY): Payer: Self-pay | Admitting: Family Medicine

## 2016-11-29 DIAGNOSIS — F1410 Cocaine abuse, uncomplicated: Secondary | ICD-10-CM | POA: Insufficient documentation

## 2016-11-29 DIAGNOSIS — R000 Tachycardia, unspecified: Secondary | ICD-10-CM | POA: Insufficient documentation

## 2016-11-29 DIAGNOSIS — Z046 Encounter for general psychiatric examination, requested by authority: Secondary | ICD-10-CM | POA: Insufficient documentation

## 2016-11-29 DIAGNOSIS — F4310 Post-traumatic stress disorder, unspecified: Secondary | ICD-10-CM | POA: Insufficient documentation

## 2016-11-29 DIAGNOSIS — F17210 Nicotine dependence, cigarettes, uncomplicated: Secondary | ICD-10-CM | POA: Insufficient documentation

## 2016-11-29 DIAGNOSIS — F1110 Opioid abuse, uncomplicated: Secondary | ICD-10-CM | POA: Insufficient documentation

## 2016-11-29 DIAGNOSIS — J4520 Mild intermittent asthma, uncomplicated: Secondary | ICD-10-CM | POA: Insufficient documentation

## 2016-11-29 DIAGNOSIS — F319 Bipolar disorder, unspecified: Secondary | ICD-10-CM | POA: Insufficient documentation

## 2016-11-29 DIAGNOSIS — R451 Restlessness and agitation: Secondary | ICD-10-CM | POA: Insufficient documentation

## 2016-11-29 DIAGNOSIS — M797 Fibromyalgia: Secondary | ICD-10-CM | POA: Insufficient documentation

## 2016-11-29 DIAGNOSIS — F1910 Other psychoactive substance abuse, uncomplicated: Secondary | ICD-10-CM | POA: Insufficient documentation

## 2016-11-29 DIAGNOSIS — F1010 Alcohol abuse, uncomplicated: Secondary | ICD-10-CM | POA: Insufficient documentation

## 2016-11-29 DIAGNOSIS — Z79899 Other long term (current) drug therapy: Secondary | ICD-10-CM | POA: Insufficient documentation

## 2016-11-29 HISTORY — DX: Post-traumatic stress disorder, unspecified: F43.10

## 2016-11-29 LAB — COMPREHENSIVE METABOLIC PANEL
ALT: 12 U/L — ABNORMAL LOW (ref 14–54)
ANION GAP: 10 (ref 5–15)
AST: 22 U/L (ref 15–41)
Albumin: 3.9 g/dL (ref 3.5–5.0)
Alkaline Phosphatase: 65 U/L (ref 38–126)
BUN: 16 mg/dL (ref 6–20)
CALCIUM: 8.9 mg/dL (ref 8.9–10.3)
CHLORIDE: 106 mmol/L (ref 101–111)
CO2: 24 mmol/L (ref 22–32)
Creatinine, Ser: 0.88 mg/dL (ref 0.44–1.00)
GFR calc non Af Amer: >60 mL/min (ref >60)
GLUCOSE: 100 mg/dL — AB (ref 65–99)
POTASSIUM: 4.5 mmol/L (ref 3.5–5.1)
SODIUM: 140 mmol/L (ref 135–145)
Total Bilirubin: 0.1 mg/dL — ABNORMAL LOW (ref 0.3–1.2)
Total Protein: 7.3 g/dL (ref 6.5–8.1)

## 2016-11-29 LAB — SALICYLATE LEVEL

## 2016-11-29 LAB — ETHANOL: Alcohol, Ethyl (B): <10 mg/dL (ref <10)

## 2016-11-29 LAB — RAPID URINE DRUG SCREEN, HOSP PERFORMED
AMPHETAMINES: NOT DETECTED
BENZODIAZEPINES: POSITIVE — AB
Barbiturates: NOT DETECTED
COCAINE: POSITIVE — AB
OPIATES: NOT DETECTED
Tetrahydrocannabinol: NOT DETECTED

## 2016-11-29 LAB — CBC
HEMATOCRIT: 35.7 % — AB (ref 36.0–46.0)
Hemoglobin: 12.1 g/dL (ref 12.0–15.0)
MCH: 28.7 pg (ref 26.0–34.0)
MCHC: 33.9 g/dL (ref 30.0–36.0)
MCV: 84.8 fL (ref 78.0–100.0)
Platelets: 219 10*3/uL (ref 150–400)
RBC: 4.21 MIL/uL (ref 3.87–5.11)
RDW: 14.3 % (ref 11.5–15.5)
WBC: 8 10*3/uL (ref 4.0–10.5)

## 2016-11-29 LAB — ACETAMINOPHEN LEVEL

## 2016-11-29 MED ORDER — ALUM & MAG HYDROXIDE-SIMETH 200-200-20 MG/5ML PO SUSP: 30.0000 mL | Freq: Four times a day (QID) | ORAL | Status: DC | PRN

## 2016-11-29 MED ORDER — ACETAMINOPHEN 325 MG PO TABS: 650.0000 mg | ORAL_TABLET | ORAL | Status: DC | PRN

## 2016-11-29 MED ORDER — NICOTINE 14 MG/24HR TD PT24: 14.0000 mg | MEDICATED_PATCH | Freq: Every day | TRANSDERMAL | Status: DC

## 2016-11-29 MED ORDER — IPRATROPIUM-ALBUTEROL 0.5-2.5 (3) MG/3ML IN SOLN
3.0000 mL | Freq: Once | RESPIRATORY_TRACT | Status: AC
  Administered 2016-11-30: 3 mL via RESPIRATORY_TRACT
  Filled 2016-11-29: qty 3

## 2016-11-29 MED ORDER — ONDANSETRON HCL 4 MG PO TABS: 4.0000 mg | ORAL_TABLET | Freq: Three times a day (TID) | ORAL | Status: DC | PRN

## 2016-11-29 MED ORDER — GABAPENTIN 300 MG PO CAPS: 600.0000 mg | ORAL_CAPSULE | ORAL | Status: DC

## 2016-11-29 MED ORDER — QUETIAPINE FUMARATE 50 MG PO TABS
50.0000 mg | ORAL_TABLET | Freq: Two times a day (BID) | ORAL | Status: DC
  Administered 2016-11-29: 100 mg via ORAL
  Filled 2016-11-29: qty 2

## 2016-11-29 MED ORDER — FLUOXETINE HCL 20 MG PO CAPS: 40.0000 mg | ORAL_CAPSULE | Freq: Every day | ORAL | Status: DC

## 2016-11-29 MED ORDER — ZOLPIDEM TARTRATE 5 MG PO TABS: 5.0000 mg | ORAL_TABLET | Freq: Every evening | ORAL | Status: DC | PRN

## 2016-11-29 MED ORDER — CLONAZEPAM 0.5 MG PO TABS
0.5000 mg | ORAL_TABLET | Freq: Two times a day (BID) | ORAL | Status: DC
  Administered 2016-11-29: 0.5 mg via ORAL
  Filled 2016-11-29: qty 1

## 2016-11-29 MED ORDER — CYCLOBENZAPRINE HCL 10 MG PO TABS
10.0000 mg | ORAL_TABLET | Freq: Two times a day (BID) | ORAL | Status: DC
  Administered 2016-11-29: 10 mg via ORAL
  Filled 2016-11-29: qty 1

## 2016-11-29 NOTE — H&P (Signed)
Behavioral Health Medical Screening Exam  Kathy [name] is an 31 y.o. female, presenting to [facility] as an walk-in, she is accompanied with her mother. TTS evaluation is commensurate symptoms of depression and substance abuse. Pt reports she has a diagnosis of bipolar disorder and PTSD and is currently taking psychiatric medications. Pt also reports she has a long history of abusing substances including heroin, benzodiazepines, cocaine and alcohol and she has not been able to maintain sobriety. Pt reports she is currently using heroin and alcohol daily and injecting cocaine 2-3 times per month (see below for detail of use). She reports withdrawal symptoms when she stops drinking including tremors, sweats, chills, nausea, vomiting and diarrhea. Pt acknowledges symptoms including crying spells, social withdrawal, loss of interest in usual pleasures, fatigue, irritability, decreased concentration, decreased sleep, decreased appetite and feelings of guilt and hopelessness.   Total Time spent with patient: 20 minutes  Psychiatric Specialty Exam: Physical Exam  Constitutional: She is oriented to person, place, and time. She appears well-developed. No distress.  HENT:  Head: Normocephalic.  Eyes: Pupils are equal, round, and reactive to light.  Respiratory: Effort normal and breath sounds normal.  Neurological: She is alert and oriented to person, place, and time. No cranial nerve deficit.  Skin: Skin is warm and dry. She is not diaphoretic.  Psychiatric: Her mood appears anxious. Her speech is slurred. She is agitated. Cognition and memory are impaired. She expresses impulsivity and inappropriate judgment. She exhibits a depressed mood. She expresses suicidal ideation.    Review of Systems  Psychiatric/Behavioral: Positive for depression, substance abuse and suicidal ideas. The patient is nervous/anxious.   All other systems reviewed and are negative.   There were no vitals taken for this visit.There is no  height or weight on file to calculate BMI.  General Appearance: Casual and Disheveled  Eye Contact:  Fair  Speech:  Pressured  Volume:  Increased  Mood:  Depressed  Affect:  Congruent  Thought Process:  Goal Directed  Orientation:  Full (Time, Place, and Person)  Thought Content:  Negative  Suicidal Thoughts:  Yes.  without intent/plan  Homicidal Thoughts:  No  Memory:  Immediate;   Poor  Judgement:  Poor  Insight:  Lacking  Psychomotor Activity:  Negative  Concentration: Concentration: Poor  Recall:  Poor  Fund of Knowledge:Poor  Language: Fair  Akathisia:  Negative  Handed:  Right  AIMS (if indicated):     Assets:  Desire for Improvement  Sleep:       Musculoskeletal: Strength & Muscle Tone: within normal limits Gait & Station: normal Patient leans: Right and N/A  There were no vitals taken for this visit.  Recommendations:  Based on my evaluation the patient appears to have an emergency medical condition for which I recommend the patient be transferred to the emergency department for further evaluation.  Kerry Hough, PA-C 11/29/2016, 9:19 PM
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
## 2016-11-29 NOTE — ED Notes (Signed)
Bed: ZO10 Expected date:  Expected time:  Means of arrival:  Comments: Room 8

## 2016-11-29 NOTE — ED Provider Notes (Signed)
[facility]-EMERGENCY DEPT Provider Note   CSN: [id number] Arrival date & time: 11/29/16  2130     History   Chief Complaint Chief Complaint  Patient presents with  . Psychiatric Evaluation    HPI Kathy [name] is a 31 y.o. female.  The history is provided by the patient.  She was sent to the emergency department by [facility] for medical clearance. She had presented with complaints of depression and substance abuse. Patient denies depression to me and denies suicidal ideation or homicidal ideation or hallucinations. She denies crying spells, early morning wakening, anhedonia. She does admit to substance abuse. She had been clean for a long time, then relapsed on cocaine and heroin went to jail. She was released from jail about 10 days ago. During that time, she was drinking alcohol and using Nucynta. She denies heroin and cocaine use. She denies marijuana use. She does have a history of asthma and is having slight wheezing currently.  Past Medical History:  Diagnosis Date  . Anxiety   . Bipolar disorder (HCC)   . Compression fracture of spine (HCC)   . Fibromyalgia   . Headache   . PTSD (post-traumatic stress disorder)   . Seizures (HCC)    Related to benzo withdrawal    Patient Active Problem List   Diagnosis Date Noted  . Substance induced mood disorder (HCC) 03/24/2016  . History of 3 spontaneous abortions 03/24/2016  . Eczema 03/24/2016  . Depression 03/24/2016  . Asthma without status asthmaticus without complication 03/24/2016  . Anxiety 03/24/2016  . Opiate abuse, continuous (HCC) 03/24/2016  . Fibromyalgia 04/17/2015  . Chronic hepatitis C virus infection (HCC) 02/28/2015  . Chronic migraine 07/23/2014  . Bipolar disorder (HCC) 05/10/2013  . PTSD (post-traumatic stress disorder) 05/10/2013  . Substance abuse (HCC) 10/03/2012    Past Surgical History:  Procedure Laterality Date  . HAND SURGERY Right     OB History     No data available       Home Medications    Prior to Admission medications   Medication Sig Start Date End Date Taking? Authorizing Provider  clonazePAM (KLONOPIN) 0.5 MG tablet Take 0.5 mg by mouth 2 (two) times daily. 11/25/16  Yes [provider]  cyclobenzaprine (FLEXERIL) 10 MG tablet Take 10 mg by mouth 2 (two) times daily.   Yes [provider]  FLUoxetine (PROZAC) 40 MG capsule Take 1 capsule by mouth daily. 11/25/16 11/25/17 Yes [provider]  gabapentin (NEURONTIN) 300 MG capsule Take 2 capsules (600 mg total) by mouth 3 (three) times daily at 8am, 3pm and bedtime. For agitation 03/29/16  Yes Armandina Stammer I, NP  QUEtiapine (SEROQUEL) 50 MG tablet Take 50-100 mg by mouth 2 (two) times daily. 50 Mg in morning and 100 Mg at bedtime   Yes [provider]    Family History History reviewed. No pertinent family history.  Social History Social History  Substance Use Topics  . Smoking status: Current Every Day Smoker    Packs/day: 1.00    Types: Cigarettes  . Smokeless tobacco: Never Used     Comment: Pt does not want to stop smoking at this time  . Alcohol use Yes     Comment: Daily. Last drink: PTA  from [facility], Drinks beer and liquor.      Allergies   Patient has no known allergies.   Review of Systems Review of Systems  All other systems reviewed and are negative.  Physical Exam Updated Vital Signs BP 120/69 (BP Location: Right Arm)   Pulse (!) 138   Temp 98 F (36.7 C) (Oral)   Resp 20   Ht  (1.626 m)   Wt 68 kg (150 lb)   SpO2 97%   BMI 25.75 kg/m   Physical Exam  Nursing note and vitals reviewed.  31 year old female, resting comfortably and in no acute distress. Vital signs are significant for tachycardia. Oxygen saturation is 97%, which is normal. Head is normocephalic and atraumatic. PERRLA, EOMI. Oropharynx is clear. Neck is nontender and supple without adenopathy or JVD. Back is nontender and there  is no CVA tenderness. Lungs have scattered expiratory wheezes without rales or rhonchi. Chest is nontender. Heart has regular rate and rhythm without murmur. Abdomen is soft, flat, nontender without masses or hepatosplenomegaly and peristalsis is normoactive. Extremities have no cyanosis or edema, full range of motion is present. Skin is warm and dry without rash. Neurologic: Mental status is normal, cranial nerves are intact, there are no motor or sensory deficits. Psychiatric: She is mildly agitated but does not appear to be responding to any internal stimuli She does not appear overtly depressed.  ED Treatments / Results  Labs (all labs ordered are listed, but only abnormal results are displayed) Labs Reviewed  COMPREHENSIVE METABOLIC PANEL - Abnormal; Notable for the following:       Result Value   Glucose, Bld 100 (*)    ALT 12 (*)    Total Bilirubin 0.1 (*)    All other components within normal limits  ACETAMINOPHEN LEVEL - Abnormal; Notable for the following:    Acetaminophen (Tylenol), Serum <10 (*)    All other components within normal limits  RAPID URINE DRUG SCREEN, HOSP PERFORMED - Abnormal; Notable for the following:    Cocaine POSITIVE (*)    Benzodiazepines POSITIVE (*)    All other components within normal limits  CBC - Abnormal; Notable for the following:    HCT 35.7 (*)    All other components within normal limits  ETHANOL  SALICYLATE LEVEL  PREGNANCY, URINE  HCG, QUANTITATIVE, PREGNANCY    EKG  EKG Interpretation  Date/Time:  Monday November 29 2016 23:29:31 EDT Ventricular Rate:  118 PR Interval:  138 QRS Duration: 82 QT Interval:  340 QTC Calculation: 476 R Axis:   85 Text Interpretation:  Sinus tachycardia Otherwise within normal limits No old tracing to compare Confirmed by Dione Booze ([id number]) on 11/29/2016 11:39:27 PM       Procedures Procedures (including critical care time)  Medications Ordered in ED Medications  clonazePAM (KLONOPIN)  tablet 0.5 mg (0.5 mg Oral Given 11/29/16 2340)  cyclobenzaprine (FLEXERIL) tablet 10 mg (10 mg Oral Given 11/29/16 2340)  FLUoxetine (PROZAC) capsule 40 mg (not administered)  gabapentin (NEURONTIN) capsule 600 mg (not administered)  QUEtiapine (SEROQUEL) tablet 50-100 mg (100 mg Oral Given 11/29/16 2340)  acetaminophen (TYLENOL) tablet 650 mg (not administered)  zolpidem (AMBIEN) tablet 5 mg (not administered)  ondansetron (ZOFRAN) tablet 4 mg (not administered)  alum & mag hydroxide-simeth (MAALOX/MYLANTA) 200-200-20 MG/5ML suspension 30 mL (not administered)  nicotine (NICODERM CQ - dosed in mg/24 hours) patch 14 mg (not administered)  ipratropium-albuterol (DUONEB) 0.5-2.5 (3) MG/3ML nebulizer solution 3 mL (3 mLs Nebulization Given 11/30/16 0003)     Initial Impression / Assessment and Plan / ED Course  I have reviewed the triage vital signs and the nursing notes.  Pertinent labs & imaging results that were available  during my care of the patient were reviewed by me and considered in my medical decision making (see chart for details).  Polysubstance abuse. Mild asthma. Old records are reviewed showing prior ED visits and hospitalizations for bipolar affective disorder and polysubstance abuse. Screening labs are obtained. She is given nebulizer treatment with albuterol and ipratropium. ECG obtained because of tachycardia.  ECG shows sinus tachycardia. X-ray was positive for cocaine which accounts for her tachycardia. Lungs are clear following albuterol with ipratropium.At this point, she is considered medically cleared for ongoing psychiatric care. She will need albuterol inhaler or nebulizer on a when necessary basis.  Final Clinical Impressions(s) / ED Diagnoses   Final diagnoses:  Polysubstance abuse (HCC)  Mild intermittent asthma without complication  Sinus tachycardia    New Prescriptions New Prescriptions   No medications on file     Dione Booze, MD 11/30/16 [phone]

## 2016-11-29 NOTE — ED Triage Notes (Signed)
Patient is accompanied with her mother at [facility] and transferred over to [facility] ED for further evaluation and medical clearance. Patient has a psychiatric history and substance abuse history. Currently, she is alert, oriented x 4 but is slurring her words. She reports she has been drinking prior to going to [facility]. Denies SI/HI but in [facility] she is passively SI with previous overdose.

## 2016-11-30 ENCOUNTER — Encounter (HOSPITAL_COMMUNITY): Payer: Self-pay

## 2016-11-30 ENCOUNTER — Inpatient Hospital Stay (HOSPITAL_COMMUNITY)
Admission: AD | Admit: 2016-11-30 | Discharge: 2016-12-06 | DRG: 897 | Disposition: A | Discharge status: Rehabilitation Facility | Payer: Federal, State, Local not specified - Other | Source: Intra-hospital | Attending: Psychiatry | Admitting: Psychiatry

## 2016-11-30 DIAGNOSIS — F4310 Post-traumatic stress disorder, unspecified: Secondary | ICD-10-CM | POA: Diagnosis present

## 2016-11-30 DIAGNOSIS — Z8659 Personal history of other mental and behavioral disorders: Secondary | ICD-10-CM

## 2016-11-30 DIAGNOSIS — F1994 Other psychoactive substance use, unspecified with psychoactive substance-induced mood disorder: Secondary | ICD-10-CM

## 2016-11-30 DIAGNOSIS — M797 Fibromyalgia: Secondary | ICD-10-CM | POA: Diagnosis present

## 2016-11-30 DIAGNOSIS — Z79899 Other long term (current) drug therapy: Secondary | ICD-10-CM

## 2016-11-30 DIAGNOSIS — F410 Panic disorder [episodic paroxysmal anxiety] without agoraphobia: Secondary | ICD-10-CM

## 2016-11-30 DIAGNOSIS — M791 Myalgia, unspecified site: Secondary | ICD-10-CM

## 2016-11-30 DIAGNOSIS — F1920 Other psychoactive substance dependence, uncomplicated: Secondary | ICD-10-CM | POA: Diagnosis present

## 2016-11-30 DIAGNOSIS — M2550 Pain in unspecified joint: Secondary | ICD-10-CM

## 2016-11-30 DIAGNOSIS — Z23 Encounter for immunization: Secondary | ICD-10-CM | POA: Diagnosis not present

## 2016-11-30 DIAGNOSIS — R5383 Other fatigue: Secondary | ICD-10-CM

## 2016-11-30 DIAGNOSIS — F419 Anxiety disorder, unspecified: Secondary | ICD-10-CM | POA: Diagnosis present

## 2016-11-30 DIAGNOSIS — F319 Bipolar disorder, unspecified: Secondary | ICD-10-CM | POA: Diagnosis present

## 2016-11-30 DIAGNOSIS — F17210 Nicotine dependence, cigarettes, uncomplicated: Secondary | ICD-10-CM | POA: Diagnosis present

## 2016-11-30 DIAGNOSIS — G4700 Insomnia, unspecified: Secondary | ICD-10-CM | POA: Diagnosis present

## 2016-11-30 DIAGNOSIS — I10 Essential (primary) hypertension: Secondary | ICD-10-CM | POA: Diagnosis present

## 2016-11-30 DIAGNOSIS — F1120 Opioid dependence, uncomplicated: Secondary | ICD-10-CM | POA: Diagnosis present

## 2016-11-30 DIAGNOSIS — B182 Chronic viral hepatitis C: Secondary | ICD-10-CM | POA: Diagnosis present

## 2016-11-30 DIAGNOSIS — F603 Borderline personality disorder: Secondary | ICD-10-CM | POA: Diagnosis present

## 2016-11-30 DIAGNOSIS — R451 Restlessness and agitation: Secondary | ICD-10-CM

## 2016-11-30 DIAGNOSIS — Z62810 Personal history of physical and sexual abuse in childhood: Secondary | ICD-10-CM | POA: Diagnosis present

## 2016-11-30 DIAGNOSIS — J45909 Unspecified asthma, uncomplicated: Secondary | ICD-10-CM | POA: Diagnosis present

## 2016-11-30 DIAGNOSIS — Z590 Homelessness: Secondary | ICD-10-CM

## 2016-11-30 DIAGNOSIS — Z91410 Personal history of adult physical and sexual abuse: Secondary | ICD-10-CM

## 2016-11-30 DIAGNOSIS — R4582 Worries: Secondary | ICD-10-CM

## 2016-11-30 DIAGNOSIS — R5381 Other malaise: Secondary | ICD-10-CM

## 2016-11-30 DIAGNOSIS — F1290 Cannabis use, unspecified, uncomplicated: Secondary | ICD-10-CM

## 2016-11-30 DIAGNOSIS — F1490 Cocaine use, unspecified, uncomplicated: Secondary | ICD-10-CM

## 2016-11-30 DIAGNOSIS — R450 Nervousness: Secondary | ICD-10-CM

## 2016-11-30 DIAGNOSIS — R454 Irritability and anger: Secondary | ICD-10-CM

## 2016-11-30 LAB — HCG, QUANTITATIVE, PREGNANCY

## 2016-11-30 LAB — PREGNANCY, URINE: PREG TEST UR: NEGATIVE

## 2016-11-30 MED ORDER — ALUM & MAG HYDROXIDE-SIMETH 200-200-20 MG/5ML PO SUSP: 30.0000 mL | ORAL | Status: DC | PRN

## 2016-11-30 MED ORDER — HYDROXYZINE HCL 25 MG PO TABS
25.0000 mg | ORAL_TABLET | Freq: Four times a day (QID) | ORAL | Status: DC | PRN
  Administered 2016-11-30 – 2016-12-01 (×2): 25 mg via ORAL
  Filled 2016-11-30 (×2): qty 1

## 2016-11-30 MED ORDER — CHLORPROMAZINE HCL 50 MG PO TABS
50.0000 mg | ORAL_TABLET | Freq: Every day | ORAL | Status: DC
  Administered 2016-11-30: 50 mg via ORAL
  Filled 2016-11-30 (×4): qty 1

## 2016-11-30 MED ORDER — FLUOXETINE HCL 10 MG PO CAPS
10.0000 mg | ORAL_CAPSULE | Freq: Every day | ORAL | Status: DC
  Administered 2016-12-01 – 2016-12-04 (×4): 10 mg via ORAL
  Filled 2016-11-30 (×6): qty 1

## 2016-11-30 MED ORDER — CHLORPROMAZINE HCL 25 MG PO TABS
25.0000 mg | ORAL_TABLET | Freq: Three times a day (TID) | ORAL | Status: DC
  Administered 2016-11-30 – 2016-12-01 (×4): 25 mg via ORAL
  Filled 2016-11-30 (×10): qty 1

## 2016-11-30 MED ORDER — CHLORDIAZEPOXIDE HCL 25 MG PO CAPS
25.0000 mg | ORAL_CAPSULE | Freq: Three times a day (TID) | ORAL | Status: AC
  Administered 2016-12-01 (×3): 25 mg via ORAL
  Filled 2016-11-30 (×3): qty 1

## 2016-11-30 MED ORDER — ADULT MULTIVITAMIN W/MINERALS CH
1.0000 | ORAL_TABLET | Freq: Every day | ORAL | Status: DC
  Administered 2016-11-30 – 2016-12-06 (×7): 1 via ORAL
  Filled 2016-11-30 (×9): qty 1

## 2016-11-30 MED ORDER — GABAPENTIN 300 MG PO CAPS
600.0000 mg | ORAL_CAPSULE | ORAL | Status: DC
  Administered 2016-11-30 – 2016-12-06 (×19): 600 mg via ORAL
  Filled 2016-11-30 (×26): qty 2

## 2016-11-30 MED ORDER — CHLORDIAZEPOXIDE HCL 25 MG PO CAPS
25.0000 mg | ORAL_CAPSULE | Freq: Four times a day (QID) | ORAL | Status: AC
  Administered 2016-11-30 (×4): 25 mg via ORAL
  Filled 2016-11-30 (×4): qty 1

## 2016-11-30 MED ORDER — ONDANSETRON 4 MG PO TBDP: 4.0000 mg | ORAL_TABLET | Freq: Four times a day (QID) | ORAL | Status: AC | PRN

## 2016-11-30 MED ORDER — QUETIAPINE FUMARATE 100 MG PO TABS
100.0000 mg | ORAL_TABLET | Freq: Every day | ORAL | Status: DC
  Filled 2016-11-30 (×2): qty 1

## 2016-11-30 MED ORDER — VITAMIN B-1 100 MG PO TABS
100.0000 mg | ORAL_TABLET | Freq: Every day | ORAL | Status: DC
  Administered 2016-12-01 – 2016-12-06 (×6): 100 mg via ORAL
  Filled 2016-11-30 (×8): qty 1

## 2016-11-30 MED ORDER — NICOTINE POLACRILEX 2 MG MT GUM
2.0000 mg | CHEWING_GUM | OROMUCOSAL | Status: DC | PRN
  Administered 2016-11-30 – 2016-12-06 (×13): 2 mg via ORAL
  Filled 2016-11-30 (×7): qty 1

## 2016-11-30 MED ORDER — INFLUENZA VAC SPLIT HIGH-DOSE 0.5 ML IM SUSY
0.5000 mL | PREFILLED_SYRINGE | INTRAMUSCULAR | Status: DC
  Filled 2016-11-30: qty 0.5

## 2016-11-30 MED ORDER — CHLORDIAZEPOXIDE HCL 25 MG PO CAPS
25.0000 mg | ORAL_CAPSULE | Freq: Four times a day (QID) | ORAL | Status: AC | PRN
  Administered 2016-12-01: 25 mg via ORAL
  Filled 2016-11-30: qty 1

## 2016-11-30 MED ORDER — FLUOXETINE HCL 20 MG PO CAPS
40.0000 mg | ORAL_CAPSULE | Freq: Every day | ORAL | Status: DC
  Administered 2016-11-30: 40 mg via ORAL
  Filled 2016-11-30 (×2): qty 2

## 2016-11-30 MED ORDER — LOPERAMIDE HCL 2 MG PO CAPS: 2.0000 mg | ORAL_CAPSULE | ORAL | Status: AC | PRN

## 2016-11-30 MED ORDER — CHLORDIAZEPOXIDE HCL 25 MG PO CAPS
25.0000 mg | ORAL_CAPSULE | ORAL | Status: AC
  Administered 2016-12-02 (×2): 25 mg via ORAL
  Filled 2016-11-30 (×2): qty 1

## 2016-11-30 MED ORDER — OXCARBAZEPINE 150 MG PO TABS
150.0000 mg | ORAL_TABLET | Freq: Two times a day (BID) | ORAL | Status: DC
  Administered 2016-11-30 – 2016-12-02 (×4): 150 mg via ORAL
  Filled 2016-11-30 (×7): qty 1

## 2016-11-30 MED ORDER — THIAMINE HCL 100 MG/ML IJ SOLN: 100.0000 mg | Freq: Once | INTRAMUSCULAR | Status: DC

## 2016-11-30 MED ORDER — NICOTINE 14 MG/24HR TD PT24
14.0000 mg | MEDICATED_PATCH | Freq: Every day | TRANSDERMAL | Status: DC
  Administered 2016-11-30: 14 mg via TRANSDERMAL
  Filled 2016-11-30 (×3): qty 1

## 2016-11-30 MED ORDER — CHLORDIAZEPOXIDE HCL 25 MG PO CAPS
25.0000 mg | ORAL_CAPSULE | Freq: Every day | ORAL | Status: AC
  Administered 2016-12-03: 25 mg via ORAL
  Filled 2016-11-30: qty 1

## 2016-11-30 MED ORDER — ACETAMINOPHEN 325 MG PO TABS
650.0000 mg | ORAL_TABLET | Freq: Four times a day (QID) | ORAL | Status: DC | PRN
  Administered 2016-11-30 – 2016-12-06 (×8): 650 mg via ORAL
  Filled 2016-11-30 (×8): qty 2

## 2016-11-30 MED ORDER — MAGNESIUM HYDROXIDE 400 MG/5ML PO SUSP: 30.0000 mL | Freq: Every day | ORAL | Status: DC | PRN

## 2016-11-30 MED ORDER — HYDROXYZINE HCL 50 MG PO TABS
50.0000 mg | ORAL_TABLET | Freq: Every day | ORAL | Status: DC
  Administered 2016-11-30 – 2016-12-02 (×3): 50 mg via ORAL
  Filled 2016-11-30 (×6): qty 1

## 2016-11-30 MED ORDER — PRAZOSIN HCL 1 MG PO CAPS
1.0000 mg | ORAL_CAPSULE | Freq: Every day | ORAL | Status: DC
  Administered 2016-11-30 – 2016-12-01 (×2): 1 mg via ORAL
  Filled 2016-11-30 (×3): qty 1

## 2016-11-30 NOTE — [facility] Suicide Risk Assessment (Signed)
[facility] INPATIENT:  Family/Significant Other Suicide Prevention Education  Suicide Prevention Education:  Education Completed; Marita Snellen (pt's mother) [phone] has been identified by the patient as the family member/significant other with whom the patient will be residing, and identified as the person(s) who will aid the patient in the event of a mental health crisis (suicidal ideations/suicide attempt).  With written consent from the patient, the family member/significant other has been provided the following suicide prevention education, prior to the and/or following the discharge of the patient.  The suicide prevention education provided includes the following:  Suicide risk factors  Suicide prevention and interventions  National Suicide Hotline telephone number  [facility] assessment telephone number  [facility] Emergency Assistance 911  [facility] and/or Residential Mobile Crisis Unit telephone number  Request made of family/significant other to:  Remove weapons (e.g., guns, rifles, knives), all items previously/currently identified as safety concern.    Remove drugs/medications (over-the-counter, prescriptions, illicit drugs), all items previously/currently identified as a safety concern.  The family member/significant other verbalizes understanding of the suicide prevention education information provided.  The family member/significant other agrees to remove the items of safety concern listed above.  Pt's mother is concerned about pt's mental health and SI thoughts "She thinks that because she doesn't have custody of her kids, sometimes her life is not worth living." Pt's mother is supportive of her going to residential treatment if she is able. Pt does not have access to weapons/firearms. SPE reviewed with pt's mother.   An Schnabel N Smart LCSW 11/30/2016, 2:56 PM

## 2016-11-30 NOTE — [facility] Group Notes (Signed)
[facility] Mental Health Association Group Therapy 11/30/2016 1:15pm  Type of Therapy: Mental Health Association Presentation  Participation Level: Active  Participation Quality: Attentive  Affect: Appropriate  Cognitive: Oriented  Insight: Developing/Improving  Engagement in Therapy: Engaged  Modes of Intervention: Discussion, Education and Socialization  Summary of Progress/Problems: Mental Health Association (MHA) Speaker came to talk about his personal journey with mental health. The pt processed ways by which to relate to the speaker. MHA speaker provided handouts and educational information pertaining to groups and services offered by the [facility]. Pt was engaged in speaker's presentation and was receptive to resources provided.    [employer], LCSW 11/30/2016 3:08 PM

## 2016-11-30 NOTE — ED Notes (Signed)
Patient transfer to [facility] by [place] with two luggages one purse and one patient beloning bag via wheelchair
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
## 2016-11-30 NOTE — Tx Team (Signed)
Initial Treatment Plan 11/30/2016 3:28 AM Clinton Gallant ZOX:[id number]    PATIENT STRESSORS: Financial difficulties Legal issue Marital or family conflict Substance abuse   PATIENT STRENGTHS: Physical Health Special hobby/interest Supportive family/friends   PATIENT IDENTIFIED PROBLEMS: Polysubstance-use   Depression                    DISCHARGE CRITERIA:  Improved stabilization in mood, thinking, and/or behavior Motivation to continue treatment in a less acute level of care Need for constant or close observation no longer present Verbal commitment to aftercare and medication compliance Withdrawal symptoms are absent or subacute and managed without 24-hour nursing intervention  PRELIMINARY DISCHARGE PLAN: Attend PHP/IOP Attend 12-step recovery group Outpatient therapy Return to previous living arrangement  PATIENT/FAMILY INVOLVEMENT: This treatment plan has been presented to and reviewed with the patient, Kathy [name]. The patient have been given the opportunity to ask questions and make suggestions.  Tyrone Apple, RN 11/30/2016, 3:28 AM
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
## 2016-11-30 NOTE — Progress Notes (Signed)
D:Pt has been irritable, wanting additional medications and demanding on the unit. MD discussed medications with pt and came back to reinforce and educate pt on medications as writer tried to discuss medications and give as ordered. Pt cursed and took her medications. Pt c/o generalized pain with fibromyalgia and was given prn tylenol. She has a slurred speech when interacting.  A:Offered support, encouragement and 15 minute checks. R:Pt denies si and hi. Safety maintained on the unit.
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
## 2016-11-30 NOTE — Progress Notes (Signed)
Recreation Therapy Notes  Animal-Assisted Activity (AAA) Program Checklist/Progress Notes Patient Eligibility Criteria Checklist & Daily Group note for Rec TxIntervention  Date: 10.16.2018 Time: 2:45pm Location: [address] Dayroom   AAA/T Program Assumption of Risk Form signed by Patient/ or Parent Legal Guardian Yes  Patient is free of allergies or sever asthma Yes  Patient reports no fear of animals Yes  Patient reports no history of cruelty to animals Yes  Patient understands his/her participation is voluntary Yes  Behavioral Response: Did not attend.    Johnney Scarlata L Copper Kirtley, LRT/CTRS         Calan Doren L 11/30/2016 2:57 PM 

## 2016-11-30 NOTE — BHH Group Notes (Signed)
Adult Psychoeducational Group Note  Date:  11/30/2016 Time:  9:49 PM  Group Topic/Focus:  Wrap-Up Group:   The focus of this group is to help patients review their daily goal of treatment and discuss progress on daily workbooks.  Participation Level:  Active  Participation Quality:  Appropriate  Affect:  Appropriate  Cognitive:  Appropriate  Insight: Appropriate and Good  Engagement in Group:  Engaged  Modes of Intervention:  Discussion  Additional Comments:  Pt rated her day a 7 because she is adjusting to the unit, pt stated that one positive trait about herself  Is that she is stronger than she thought. NO SI/HI.   Berlin Hun A 11/30/2016, 9:49 PM

## 2016-12-01 MED ORDER — INFLUENZA VAC SPLIT QUAD 0.5 ML IM SUSY
0.5000 mL | PREFILLED_SYRINGE | Freq: Once | INTRAMUSCULAR | Status: AC
  Administered 2016-12-01: 0.5 mL via INTRAMUSCULAR
  Filled 2016-12-01: qty 0.5

## 2016-12-01 MED ORDER — QUETIAPINE FUMARATE 50 MG PO TABS
50.0000 mg | ORAL_TABLET | Freq: Every day | ORAL | Status: DC
  Administered 2016-12-02: 50 mg via ORAL
  Filled 2016-12-01 (×2): qty 1

## 2016-12-01 MED ORDER — QUETIAPINE FUMARATE 100 MG PO TABS
100.0000 mg | ORAL_TABLET | Freq: Every day | ORAL | Status: DC
  Administered 2016-12-01: 100 mg via ORAL
  Filled 2016-12-01 (×3): qty 1

## 2016-12-01 MED ORDER — QUETIAPINE FUMARATE 25 MG PO TABS
25.0000 mg | ORAL_TABLET | Freq: Two times a day (BID) | ORAL | Status: DC | PRN
  Administered 2016-12-01 – 2016-12-05 (×7): 25 mg via ORAL
  Filled 2016-12-01 (×7): qty 1

## 2016-12-01 MED ORDER — HYDROXYZINE HCL 50 MG PO TABS
50.0000 mg | ORAL_TABLET | Freq: Four times a day (QID) | ORAL | Status: AC | PRN
  Administered 2016-12-01 – 2016-12-02 (×2): 50 mg via ORAL
  Filled 2016-12-01 (×2): qty 1

## 2016-12-01 NOTE — Progress Notes (Signed)
D:Pt c/o anxiety this morning requesting medication for anxiety and pain. Pt reports that she does not know why she can not get klonopin at this hospital because she plans to get some when she leaves here. Pt also requests Seroquel at [facility] to go with her other medications and says that she plans to tell the doctor when she sees him. Pt's mood continues to be labile and impulsive.  A:Offered support, encouragement and 15 minute checks.  R:Pt denies si and hi. Safety maintained on the unit.

## 2016-12-01 NOTE — Plan of Care (Signed)
Problem: Safety: Goal: Periods of time without injury will increase Outcome: Progressing Patient is on q15 minute safety checks and high fall risk precautions. Patient contracts for safety on the unit and remains safe at this time.   

## 2016-12-01 NOTE — Progress Notes (Signed)
Recreation Therapy Notes  Date:  12/01/16  Time: 0930 Location: [address] Dayroom  Group Topic: Stress Management  Goal Area(s) Addresses:  Patient will verbalize importance of using healthy stress management.  Patient will identify positive emotions associated with healthy stress management.   Behavioral Response: Engaged  Intervention: Stress Management  Activity :  Meditation.  LRT introduced the stress management technique of meditation.  LRT played a meditation from the Calm app to allow patients to focus on the forgiveness of others.  Patients followed along as LRT played meditation.  Education:  Stress Management, Discharge Planning.   Education Outcome: Acknowledges edcuation/In group clarification offered/Needs additional education  Clinical Observations/Feedback: Pt attended group.    Caroll RancherMarjette Johnna Bollier, LRT/CTRS         Caroll RancherLindsay, Jiles Goya A 12/01/2016 12:08 PM

## 2016-12-01 NOTE — Tx Team (Signed)
Interdisciplinary Treatment and Diagnostic Plan Update  12/01/2016 Time of Session: 0830AM Kathy [name] MRN: [id number]  Principal Diagnosis: Polysubstance dependence including opioid type drug, continuous use (HCC)  Secondary Diagnoses: Principal Problem:   Polysubstance dependence including opioid type drug, continuous use (HCC) Active Problems:   Substance induced mood disorder (HCC)   Polysubstance (excluding opioids) dependence, daily use (HCC)   Current Medications:  Current Facility-Administered Medications  Medication Dose Route Frequency Provider Last Rate Last Dose  . acetaminophen (TYLENOL) tablet 650 mg  650 mg Oral Q6H PRN Kerry Hough, PA-C   650 mg at 12/01/16 0758  . alum & mag hydroxide-simeth (MAALOX/MYLANTA) 200-200-20 MG/5ML suspension 30 mL  30 mL Oral Q4H PRN Donell Sievert E, PA-C      . chlordiazePOXIDE (LIBRIUM) capsule 25 mg  25 mg Oral Q6H PRN Kerry Hough, PA-C      . chlordiazePOXIDE (LIBRIUM) capsule 25 mg  25 mg Oral TID Donell Sievert E, PA-C   25 mg at 12/01/16 0755   Followed by  . [START ON 12/02/2016] chlordiazePOXIDE (LIBRIUM) capsule 25 mg  25 mg Oral BH-qamhs Simon, Spencer E, PA-C       Followed by  . [START ON 12/03/2016] chlordiazePOXIDE (LIBRIUM) capsule 25 mg  25 mg Oral Daily Simon, Spencer E, PA-C      . chlorproMAZINE (THORAZINE) tablet 25 mg  25 mg Oral TID Armandina Stammer I, NP   25 mg at 12/01/16 0755  . chlorproMAZINE (THORAZINE) tablet 50 mg  50 mg Oral QHS Armandina Stammer I, NP   50 mg at 11/30/16 2106  . FLUoxetine (PROZAC) capsule 10 mg  10 mg Oral Daily Armandina Stammer I, NP   10 mg at 12/01/16 0755  . gabapentin (NEURONTIN) capsule 600 mg  600 mg Oral [id number] Donell Sievert E, PA-C   600 mg at 12/01/16 0754  . hydrOXYzine (ATARAX/VISTARIL) tablet 25 mg  25 mg Oral Q6H PRN Kerry Hough, PA-C   25 mg at 12/01/16 1012  . hydrOXYzine (ATARAX/VISTARIL) tablet 50 mg  50 mg Oral QHS Armandina Stammer I, NP   50 mg at 11/30/16 2105  .  loperamide (IMODIUM) capsule 2-4 mg  2-4 mg Oral PRN Donell Sievert E, PA-C      . magnesium hydroxide (MILK OF MAGNESIA) suspension 30 mL  30 mL Oral Daily PRN Kerry Hough, PA-C      . multivitamin with minerals tablet 1 tablet  1 tablet Oral Daily Kerry Hough, PA-C   1 tablet at 12/01/16 0755  . nicotine polacrilex (NICORETTE) gum 2 mg  2 mg Oral PRN Armandina Stammer I, NP   2 mg at 11/30/16 2109  . ondansetron (ZOFRAN-ODT) disintegrating tablet 4 mg  4 mg Oral Q6H PRN Kerry Hough, PA-C      . OXcarbazepine (TRILEPTAL) tablet 150 mg  150 mg Oral BID Armandina Stammer I, NP   150 mg at 12/01/16 0754  . prazosin (MINIPRESS) capsule 1 mg  1 mg Oral QHS Nwoko, Agnes I, NP   1 mg at 11/30/16 2105  . thiamine (B-1) injection 100 mg  100 mg Intramuscular Once Donell Sievert E, PA-C      . thiamine (VITAMIN B-1) tablet 100 mg  100 mg Oral Daily Kerry Hough, PA-C   100 mg at 12/01/16 9562   PTA Medications: Prescriptions Prior to Admission  Medication Sig Dispense Refill Last Dose  . clonazePAM (KLONOPIN) 0.5 MG tablet Take 0.5 mg by mouth 2 (  two) times daily.   11/29/2016 at Unknown time  . cyclobenzaprine (FLEXERIL) 10 MG tablet Take 10 mg by mouth 2 (two) times daily.   11/29/2016 at Unknown time  . FLUoxetine (PROZAC) 40 MG capsule Take 1 capsule by mouth daily.   11/29/2016 at Unknown time  . gabapentin (NEURONTIN) 300 MG capsule Take 2 capsules (600 mg total) by mouth 3 (three) times daily at 8am, 3pm and bedtime. For agitation 180 capsule 0 11/29/2016 at Unknown time  . QUEtiapine (SEROQUEL) 50 MG tablet Take 50-100 mg by mouth 2 (two) times daily. 50 Mg in morning and 100 Mg at bedtime   11/29/2016 at Unknown time    Patient Stressors: Financial difficulties Legal issue Marital or family conflict Substance abuse  Patient Strengths: Physical Health Special hobby/interest Supportive family/friends  Treatment Modalities: Medication Management, Group therapy, Case management,  1  to 1 session with clinician, Psychoeducation, Recreational therapy.   Physician Treatment Plan for Primary Diagnosis: Polysubstance dependence including opioid type drug, continuous use (HCC) Long Term Goal(s): Improvement in symptoms so as ready for discharge Improvement in symptoms so as ready for discharge   Short Term Goals: Ability to identify changes in lifestyle to reduce recurrence of condition will improve Ability to verbalize feelings will improve Ability to demonstrate self-control will improve Ability to identify and develop effective coping behaviors will improve Compliance with prescribed medications will improve Ability to identify triggers associated with substance abuse/mental health issues will improve  Medication Management: Evaluate patient's response, side effects, and tolerance of medication regimen.  Therapeutic Interventions: 1 to 1 sessions, Unit Group sessions and Medication administration.  Evaluation of Outcomes: Progressing  Physician Treatment Plan for Secondary Diagnosis: Principal Problem:   Polysubstance dependence including opioid type drug, continuous use (HCC) Active Problems:   Substance induced mood disorder (HCC)   Polysubstance (excluding opioids) dependence, daily use (HCC)  Long Term Goal(s): Improvement in symptoms so as ready for discharge Improvement in symptoms so as ready for discharge   Short Term Goals: Ability to identify changes in lifestyle to reduce recurrence of condition will improve Ability to verbalize feelings will improve Ability to demonstrate self-control will improve Ability to identify and develop effective coping behaviors will improve Compliance with prescribed medications will improve Ability to identify triggers associated with substance abuse/mental health issues will improve     Medication Management: Evaluate patient's response, side effects, and tolerance of medication regimen.  Therapeutic Interventions: 1  to 1 sessions, Unit Group sessions and Medication administration.  Evaluation of Outcomes: Progressing   RN Treatment Plan for Primary Diagnosis: Polysubstance dependence including opioid type drug, continuous use (HCC) Long Term Goal(s): Knowledge of disease and therapeutic regimen to maintain health will improve  Short Term Goals: Ability to remain free from injury will improve, Ability to verbalize feelings will improve and Ability to disclose and discuss suicidal ideas  Medication Management: RN will administer medications as ordered by provider, will assess and evaluate patient's response and provide education to patient for prescribed medication. RN will report any adverse and/or side effects to prescribing provider.  Therapeutic Interventions: 1 on 1 counseling sessions, Psychoeducation, Medication administration, Evaluate responses to treatment, Monitor vital signs and CBGs as ordered, Perform/monitor CIWA, COWS, AIMS and Fall Risk screenings as ordered, Perform wound care treatments as ordered.  Evaluation of Outcomes: Progressing   LCSW Treatment Plan for Primary Diagnosis: Polysubstance dependence including opioid type drug, continuous use (HCC) Long Term Goal(s): Safe transition to appropriate next level of care at discharge,  Engage patient in therapeutic group addressing interpersonal concerns.  Short Term Goals: Engage patient in aftercare planning with referrals and resources, Facilitate patient progression through stages of change regarding substance use diagnoses and concerns and Identify triggers associated with mental health/substance abuse issues  Therapeutic Interventions: Assess for all discharge needs, 1 to 1 time with Social worker, Explore available resources and support systems, Assess for adequacy in community support network, Educate family and significant other(s) on suicide prevention, Complete Psychosocial Assessment, Interpersonal group therapy.  Evaluation of  Outcomes: Progressing   Progress in Treatment: Attending groups: Yes. monopolizing and intrusive at times.  Participating in groups: Yes. Taking medication as prescribed: Yes. Toleration medication: Yes. Family/Significant other contact made: Yes, individual(s) contacted:  pt's mother Patient understands diagnosis: Yes. Discussing patient identified problems/goals with staff: Yes. Medical problems stabilized or resolved: Yes. Denies suicidal/homicidal ideation: Yes. Issues/concerns per patient self-inventory: No. Other: n/a   New problem(s) identified: Yes, Describe:  pt continues to present as labile, medication seeking, and verbally aggressive at times.   New Short Term/Long Term Goal(s): medication management for mood stabilization/detox, elimination of SI thoughts; development of comprehensive mental wellness/sobriety plan.   Discharge Plan or Barriers: CSW assessing. Pt is hoping to get into [facility] in [place]-clinicals faxed 10/17 to [place] in admissions. [facility] referral also made.   Reason for Continuation of Hospitalization: Anxiety Mania Medication stabilization Withdrawal symptoms  Estimated Length of Stay: Monday 12/06/16  Attendees: Patient: 12/01/2016 10:55 AM  Physician: Dr. Altamese Carolinaain[place] MD; Dr. Jama Flavorsobos MD 12/01/2016 10:55 AM  Nursing: Luanna Coleoni, Jan RN 12/01/2016 10:55 AM  RN Care Manager: Onnie BoerJennifer clark CM 12/01/2016 10:55 AM  Social Worker: Trula SladeHeather Smart, LCSW 12/01/2016 10:55 AM  Recreational Therapist: x 12/01/2016 10:55 AM  Other: Reola Calkinsravis Money NP; Armandina Stammergnes Nwoko NP 12/01/2016 10:55 AM  Other:  12/01/2016 10:55 AM  Other: 12/01/2016 10:55 AM    Scribe for Treatment Team: Ledell PeoplesHeather N Smart, LCSW 12/01/2016 10:55 AM

## 2016-12-01 NOTE — Progress Notes (Signed)
[facility] MD Progress Note  12/01/2016 6:22 PM Kathy [name]  MRN:  [id number] Subjective:   Pt is a 31 y/o F who presented with worsening mood and disorganization symptoms. Today she focuses on her medications, stating that thorazine has not been helpful. She perseverates on concern that her symptoms of "ADHD" have not been treated. Discussed with patient that goal of her medications is to improve mood stability which should then improve concentration. Pt reports that she did will with seroquel in the past and requested to change back to seroquel rather than thorazine. She denied SI/HI/AH/VH. She agreed to continue her other medications without changes. She had no further questions, comments, or concerns.   Principal Problem: Polysubstance dependence including opioid type drug, continuous use ([place]) Diagnosis:   Patient Active Problem List   Diagnosis Date Noted  . Polysubstance (excluding opioids) dependence, daily use ([place]) [F19.20] 11/30/2016  . Polysubstance dependence including opioid type drug, continuous use ([place]) [F11.20, F19.20] 11/30/2016  . Substance induced mood disorder ([place]) [F19.94] 03/24/2016  . History of 3 spontaneous abortions [Z87.42] 03/24/2016  . Eczema [L30.9] 03/24/2016  . Depression [F32.9] 03/24/2016  . Asthma without status asthmaticus without complication [I96.789] 38/11/1749  . Anxiety [F41.9] 03/24/2016  . Opiate abuse, continuous ([place]) [F11.10] 03/24/2016  . Fibromyalgia [M79.7] 04/17/2015  . Chronic hepatitis C virus infection ([place]) [B18.2] 02/28/2015  . Chronic migraine [G43.709] 07/23/2014  . Bipolar disorder ([place]) [F31.9] 05/10/2013  . PTSD (post-traumatic stress disorder) [F43.10] 05/10/2013  . Substance abuse ([place]) [F19.10] 10/03/2012   Total Time spent with patient: 30 minutes  Past Psychiatric History:   Past Medical History:  Past Medical History:  Diagnosis Date  . Anxiety   . Bipolar disorder ([place])   . Compression fracture of spine ([place])   .  Fibromyalgia   . Headache   . PTSD (post-traumatic stress disorder)   . Seizures ([place])    Related to benzo withdrawal    Past Surgical History:  Procedure Laterality Date  . HAND SURGERY Right    Family History: History reviewed. No pertinent family history. Family Psychiatric  History:  Social History:  History  Alcohol Use  . Yes    Comment: Daily. Last drink: PTA  from [facility], Drinks beer and liquor.      History  Drug Use  . Types: Cocaine, Benzodiazepines, Heroin, Hydrocodone    Comment: Cocaine: last used; Heroin: last used 09/17/2016;     Social History   Social History  . Marital status: Single    Spouse name: N/A  . Number of children: N/A  . Years of education: N/A   Social History Main Topics  . Smoking status: Current Every Day Smoker    Packs/day: 1.00    Types: Cigarettes  . Smokeless tobacco: Never Used     Comment: Pt does not want to stop smoking at this time  . Alcohol use Yes     Comment: Daily. Last drink: PTA  from [facility], Drinks beer and liquor.   . Drug use: Yes    Types: Cocaine, Benzodiazepines, Heroin, Hydrocodone     Comment: Cocaine: last used; Heroin: last used 09/17/2016;   . Sexual activity: Yes    Birth control/ protection: Injection     Comment: Depo Provera   Other Topics Concern  . None   Social History Narrative  . None    Sleep: Fair  Appetite:  Fair  Current Medications: Current Facility-Administered Medications  Medication Dose Route Frequency Provider Last Rate Last Dose  .  acetaminophen (TYLENOL) tablet 650 mg  650 mg Oral Q6H PRN Laverle Hobby, PA-C   650 mg at 12/01/16 0758  . alum & mag hydroxide-simeth (MAALOX/MYLANTA) 200-200-20 MG/5ML suspension 30 mL  30 mL Oral Q4H PRN Patriciaann Clan E, PA-C      . chlordiazePOXIDE (LIBRIUM) capsule 25 mg  25 mg Oral Q6H PRN Laverle Hobby, PA-C      . [START ON 12/02/2016] chlordiazePOXIDE (LIBRIUM) capsule 25 mg  25 mg Oral BH-qamhs Simon, Spencer E, PA-C       Followed  by  . [START ON 12/03/2016] chlordiazePOXIDE (LIBRIUM) capsule 25 mg  25 mg Oral Daily Simon, Spencer E, PA-C      . FLUoxetine (PROZAC) capsule 10 mg  10 mg Oral Daily Lindell Spar I, NP   10 mg at 12/01/16 0755  . gabapentin (NEURONTIN) capsule 600 mg  600 mg Oral [id number] Laverle Hobby, PA-C   600 mg at 12/01/16 1451  . hydrOXYzine (ATARAX/VISTARIL) tablet 50 mg  50 mg Oral QHS Lindell Spar I, NP   50 mg at 11/30/16 2105  . hydrOXYzine (ATARAX/VISTARIL) tablet 50 mg  50 mg Oral Q6H PRN Pennelope Bracken, MD      . loperamide (IMODIUM) capsule 2-4 mg  2-4 mg Oral PRN Laverle Hobby, PA-C      . magnesium hydroxide (MILK OF MAGNESIA) suspension 30 mL  30 mL Oral Daily PRN Laverle Hobby, PA-C      . multivitamin with minerals tablet 1 tablet  1 tablet Oral Daily Laverle Hobby, PA-C   1 tablet at 12/01/16 0755  . nicotine polacrilex (NICORETTE) gum 2 mg  2 mg Oral PRN Lindell Spar I, NP   2 mg at 11/30/16 2109  . ondansetron (ZOFRAN-ODT) disintegrating tablet 4 mg  4 mg Oral Q6H PRN Laverle Hobby, PA-C      . OXcarbazepine (TRILEPTAL) tablet 150 mg  150 mg Oral BID Lindell Spar I, NP   150 mg at 12/01/16 1629  . prazosin (MINIPRESS) capsule 1 mg  1 mg Oral QHS Nwoko, Agnes I, NP   1 mg at 11/30/16 2105  . QUEtiapine (SEROQUEL) tablet 100 mg  100 mg Oral QHS Pennelope Bracken, MD       And  . Derrill Memo ON 12/02/2016] QUEtiapine (SEROQUEL) tablet 50 mg  50 mg Oral Q breakfast Maris Berger T, MD      . QUEtiapine (SEROQUEL) tablet 25 mg  25 mg Oral Q12H PRN Pennelope Bracken, MD   25 mg at 12/01/16 1629  . thiamine (B-1) injection 100 mg  100 mg Intramuscular Once Patriciaann Clan E, PA-C      . thiamine (VITAMIN B-1) tablet 100 mg  100 mg Oral Daily Laverle Hobby, PA-C   100 mg at 12/01/16 5465    Lab Results:  Results for orders placed or performed during the hospital encounter of 11/29/16 (from the past 48 hour(s))  Comprehensive metabolic panel      Status: Abnormal   Collection Time: 11/29/16 10:30 PM  Result Value Ref Range   Sodium 140 135 - 145 mmol/L   Potassium 4.5 3.5 - 5.1 mmol/L   Chloride 106 101 - 111 mmol/L   CO2 24 22 - 32 mmol/L   Glucose, Bld 100 (H) 65 - 99 mg/dL   BUN 16 6 - 20 mg/dL   Creatinine, Ser 0.88 0.44 - 1.00 mg/dL   Calcium 8.9 8.9 - 10.3 mg/dL  Total Protein 7.3 6.5 - 8.1 g/dL   Albumin 3.9 3.5 - 5.0 g/dL   AST 22 15 - 41 U/L   ALT 12 (L) 14 - 54 U/L   Alkaline Phosphatase 65 38 - 126 U/L   Total Bilirubin 0.1 (L) 0.3 - 1.2 mg/dL   GFR calc non Af Amer >60 >60 mL/min   GFR calc Af Amer >60 >60 mL/min    Comment: (NOTE) The eGFR has been calculated using the CKD EPI equation. This calculation has not been validated in all clinical situations. eGFR's persistently <60 mL/min signify possible Chronic Kidney Disease.    Anion gap 10 5 - 15  Ethanol     Status: None   Collection Time: 11/29/16 10:30 PM  Result Value Ref Range   Alcohol, Ethyl (B) <10 <10 mg/dL    Comment:        LOWEST DETECTABLE LIMIT FOR SERUM ALCOHOL IS 10 mg/dL FOR MEDICAL PURPOSES ONLY   Salicylate level     Status: None   Collection Time: 11/29/16 10:30 PM  Result Value Ref Range   Salicylate Lvl <9.5 2.8 - 30.0 mg/dL  Acetaminophen level     Status: Abnormal   Collection Time: 11/29/16 10:30 PM  Result Value Ref Range   Acetaminophen (Tylenol), Serum <10 (L) 10 - 30 ug/mL    Comment:        THERAPEUTIC CONCENTRATIONS VARY SIGNIFICANTLY. A RANGE OF 10-30 ug/mL MAY BE AN EFFECTIVE CONCENTRATION FOR MANY PATIENTS. HOWEVER, SOME ARE BEST TREATED AT CONCENTRATIONS OUTSIDE THIS RANGE. ACETAMINOPHEN CONCENTRATIONS >150 ug/mL AT 4 HOURS AFTER INGESTION AND >50 ug/mL AT 12 HOURS AFTER INGESTION ARE OFTEN ASSOCIATED WITH TOXIC REACTIONS.   hCG, quantitative, pregnancy     Status: None   Collection Time: 11/29/16 10:30 PM  Result Value Ref Range   hCG, Beta Chain, Quant, S <1 <5 mIU/mL    Comment:          GEST.  AGE      CONC.  (mIU/mL)   <=1 WEEK        5 - 50     2 WEEKS       50 - 500     3 WEEKS       100 - 10,000     4 WEEKS     1,000 - 30,000     5 WEEKS     3,500 - 115,000   6-8 WEEKS     12,000 - 270,000    12 WEEKS     15,000 - 220,000        FEMALE AND NON-PREGNANT FEMALE:     LESS THAN 5 mIU/mL   Rapid urine drug screen (hospital performed)     Status: Abnormal   Collection Time: 11/29/16 10:45 PM  Result Value Ref Range   Opiates NONE DETECTED NONE DETECTED   Cocaine POSITIVE (A) NONE DETECTED   Benzodiazepines POSITIVE (A) NONE DETECTED   Amphetamines NONE DETECTED NONE DETECTED   Tetrahydrocannabinol NONE DETECTED NONE DETECTED   Barbiturates NONE DETECTED NONE DETECTED    Comment:        DRUG SCREEN FOR MEDICAL PURPOSES ONLY.  IF CONFIRMATION IS NEEDED FOR ANY PURPOSE, NOTIFY LAB WITHIN 5 DAYS.        LOWEST DETECTABLE LIMITS FOR URINE DRUG SCREEN Drug Class       Cutoff (ng/mL) Amphetamine      1000 Barbiturate      200 Benzodiazepine   093 Tricyclics  300 Opiates          300 Cocaine          300 THC              50   Pregnancy, urine     Status: None   Collection Time: 11/29/16 10:45 PM  Result Value Ref Range   Preg Test, Ur NEGATIVE NEGATIVE    Comment:        THE SENSITIVITY OF THIS METHODOLOGY IS >20 mIU/mL.   CBC     Status: Abnormal   Collection Time: 11/29/16 11:08 PM  Result Value Ref Range   WBC 8.0 4.0 - 10.5 K/uL   RBC 4.21 3.87 - 5.11 MIL/uL   Hemoglobin 12.1 12.0 - 15.0 g/dL   HCT 35.7 (L) 36.0 - 46.0 %   MCV 84.8 78.0 - 100.0 fL   MCH 28.7 26.0 - 34.0 pg   MCHC 33.9 30.0 - 36.0 g/dL   RDW 14.3 11.5 - 15.5 %   Platelets 219 150 - 400 K/uL    Blood Alcohol level:  Lab Results  Component Value Date   ETH <10 11/29/2016   ETH <5 47/42/5956    Metabolic Disorder Labs: Lab Results  Component Value Date   HGBA1C 5.2 03/24/2016   MPG 103 03/24/2016   Lab Results  Component Value Date   PROLACTIN 54.1 (H) 03/24/2016   Lab  Results  Component Value Date   CHOL 164 03/24/2016   TRIG 99 03/24/2016   HDL 47 03/24/2016   CHOLHDL 3.5 03/24/2016   VLDL 20 03/24/2016   LDLCALC 97 03/24/2016    Physical Findings: AIMS: Facial and Oral Movements Muscles of Facial Expression: None, normal Lips and Perioral Area: None, normal Jaw: None, normal Tongue: None, normal,Extremity Movements Upper (arms, wrists, hands, fingers): None, normal Lower (legs, knees, ankles, toes): None, normal, Trunk Movements Neck, shoulders, hips: None, normal, Overall Severity Severity of abnormal movements (highest score from questions above): None, normal Incapacitation due to abnormal movements: None, normal Patient's awareness of abnormal movements (rate only patient's report): No Awareness, Dental Status Current problems with teeth and/or dentures?: No Does patient usually wear dentures?: No  CIWA:  CIWA-Ar Total: 5 COWS:  COWS Total Score: 8  Musculoskeletal: Strength & Muscle Tone: within normal limits Gait & Station: normal Patient leans: N/A  Psychiatric Specialty Exam: Physical Exam  Review of Systems  Constitutional: Negative.   Cardiovascular: Negative.   Gastrointestinal: Negative.   Skin: Negative.   Neurological: Negative.     Blood pressure 116/78, pulse (!) 113, temperature 98.5 F (36.9 C), temperature source Oral, resp. rate 16, height '5\' 4"'  (1.626 m), weight 74.8 kg (165 lb).Body mass index is 28.32 kg/m.  General Appearance: Casual  Eye Contact:  Fair  Speech:  Clear and Coherent  Volume:  Increased  Mood:  Dysphoric  Affect:  Blunt  Thought Process:  Goal Directed  Orientation:  Full (Time, Place, and Person)  Thought Content:  Logical and Rumination  Suicidal Thoughts:  No  Homicidal Thoughts:  No  Memory:  Immediate;   Fair Recent;   Fair Remote;   Fair  Judgement:  Fair  Insight:  Lacking  Psychomotor Activity:  Normal  Concentration:  Concentration: Fair  Recall:  [employer] of  Knowledge:  Fair  Language:  Good  Akathisia:  No  Handed:    AIMS (if indicated):     Assets:  Desire for Improvement  ADL's:  Intact  Cognition:  WNL  Sleep:  Number of Hours: 2 (late admission)     Treatment Plan Summary: Daily contact with patient to assess and evaluate symptoms and progress in treatment and Medication management  - DC thorazine - Start seroquel 65m qAM + 1053mqhs - Start seroquel 2533mo q12h PRN agitation/psychosis - Discharge planning will be ongoing  ChrPennelope BrackenD 12/01/2016, 6:22 PM

## 2016-12-01 NOTE — Progress Notes (Signed)
Pt is new to the unit this afternoon.  She is reporting withdrawal symptoms and pain from her fibromyalgia.  She says she is feeling restless and irritable.  She has been in the dayroom interacting with peers.  The morning started out rough as she and her roommate took a little time to get along.  This evening, they have been in the dayroom talking and doing activities together.  Pt denies SI/HI/AVH.  Writer reviewed her medications with her.  She feels she is not getting adequate pain medication for her fibromyalgia.  Pt was encouraged to speak to the doctor tomorrow with her concerns.  Support and encouragement offered.  Discharge plans are in process.  Safety maintained with q15 minute checks.

## 2016-12-01 NOTE — Plan of Care (Signed)
Problem: Safety: Goal: Ability to disclose and discuss suicidal ideas will improve Outcome: Progressing Pt denies si thoughts.

## 2016-12-01 NOTE — Progress Notes (Signed)
Nursing Progress Note [id number]  D) Patient presents anxious, restless and hyperactive this evening. Patient attended group and was visible up in the milieu. Patient with rapid, tangential speech. Patient compliant with scheduled medications at 2100. At 2220, patient reported feeling an increased in anxiety and agitation. Patient stated, "I feel wired and out of control". HR 113. CIWA score 10 and PRN Librium given with relief. Patient denies SI/HI/AVH or pain. Patient complains of a mild cough and some congestion but denies need for medication at this time. Patient contracts for safety on the unit.   A) Emotional support given. 1:1 interaction and active listening provided. Patient medicated as prescribed. Medications and plan of care reviewed with patient. Patient verbalized understanding without further questions. Snacks and fluids provided. Opportunities for questions or concerns presented to patient. Patient encouraged to continue to work on treatment goals. Labs, vital signs and patient behavior monitored throughout shift. Patient safety maintained with q15 min safety checks. High fall risk precautions in place and reviewed with patient; patient verbalized understanding.  R) Patient receptive to interaction with nurse. Patient remains safe on the unit at this time. Patient denies any adverse medication reactions at this time. Patient is resting in bed without complaints. Will continue to monitor.

## 2016-12-01 NOTE — [facility] Group Notes (Signed)
LCSW Group Therapy Note   12/01/2016 1:15pm   Type of Therapy and Topic:  Group Therapy:  Overcoming Obstacles   Participation Level:  Active   Description of Group:    In this group patients will be encouraged to explore what they see as obstacles to their own wellness and recovery. They will be guided to discuss their thoughts, feelings, and behaviors related to these obstacles. The group will process together ways to cope with barriers, with attention given to specific choices patients can make. Each patient will be challenged to identify changes they are motivated to make in order to overcome their obstacles. This group will be process-oriented, with patients participating in exploration of their own experiences as well as giving and receiving support and challenge from other group members.   Therapeutic Goals: 1. Patient will identify personal and current obstacles as they relate to admission. 2. Patient will identify barriers that currently interfere with their wellness or overcoming obstacles.  3. Patient will identify feelings, thought process and behaviors related to these barriers. 4. Patient will identify two changes they are willing to make to overcome these obstacles:      Summary of Patient Progress Kathy [name] was attentive and at times, monopolizing during group. She shared that she feels "really messed up in the head" and hopes that the MD can put her on medication that "calms my brain down." She had difficulty finishing her thoughts and presented with anxious/irritable affect. Kathy [name] is worried about legal issues that are facing her and hopes to get [place]. She continues to demonstrate limited insight with some progress in the group setting.      Therapeutic Modalities:   Cognitive Behavioral Therapy Solution Focused Therapy Motivational Interviewing Relapse Prevention Therapy  Ledell PeoplesHeather N Smart, LCSW 12/01/2016 3:08 PM

## 2016-12-01 NOTE — Progress Notes (Signed)
Marylene Landngela in admissions at [facility] in [place] called back with fax number: [phone]. Release and clinicals faxed per patient and admissions request.  Trula SladeHeather Smart, MSW, LCSW Clinical Social Worker 12/01/2016 10:55 AM

## 2016-12-02 DIAGNOSIS — F17210 Nicotine dependence, cigarettes, uncomplicated: Secondary | ICD-10-CM

## 2016-12-02 DIAGNOSIS — F1920 Other psychoactive substance dependence, uncomplicated: Principal | ICD-10-CM

## 2016-12-02 MED ORDER — QUETIAPINE FUMARATE 50 MG PO TABS
50.0000 mg | ORAL_TABLET | Freq: Two times a day (BID) | ORAL | Status: DC
  Administered 2016-12-02 – 2016-12-03 (×2): 50 mg via ORAL
  Filled 2016-12-02 (×6): qty 1

## 2016-12-02 MED ORDER — QUETIAPINE FUMARATE 200 MG PO TABS
200.0000 mg | ORAL_TABLET | Freq: Every day | ORAL | Status: DC
  Administered 2016-12-02: 200 mg via ORAL
  Filled 2016-12-02 (×3): qty 1

## 2016-12-02 MED ORDER — PRAZOSIN HCL 2 MG PO CAPS
2.0000 mg | ORAL_CAPSULE | Freq: Every day | ORAL | Status: DC
  Administered 2016-12-02 – 2016-12-05 (×4): 2 mg via ORAL
  Filled 2016-12-02 (×3): qty 1
  Filled 2016-12-02: qty 21
  Filled 2016-12-02 (×2): qty 1

## 2016-12-02 NOTE — Progress Notes (Signed)
Patient has continued to use foul language through out the day after being redirected several times by staff.  Patients continual inappropriate verbal communcation has agitated other patients and has made it a challenge for staff to redirect.

## 2016-12-02 NOTE — [facility] Group Notes (Signed)

## 2016-12-02 NOTE — Plan of Care (Signed)
Problem: Health Behavior/Discharge Planning: Goal: Compliance with treatment plan for underlying cause of condition will improve Outcome: Progressing Patient taking medications and attending groups per plan of care. Patient verbalizes understanding and is agreeable to current plan of care.   

## 2016-12-02 NOTE — Progress Notes (Signed)
Patient has been upset throughout the day, talking very loudly, interrupting other staff and patients.  Patient continues to ask for more medications, that she has been taking more medications, etc.  MD/NP/staff informed.  Patient has been asked several times by staff members to lower her voice.  Patient has been asked to lower her voice while talking in the hallway, group room and on the phone.  That the phone would be cut off if patient continued to yell on the phone.  After the discussion about her phone use, patient's voice became lower.  Respirations even and unlabored.  No signs/symptoms of pain/distress noted on patient's face/body movements.  Safety maintained with 15 minute checks.

## 2016-12-02 NOTE — [facility] Group Notes (Signed)
LCSW Group Therapy Note  12/02/2016 1:15pm  Type of Therapy and Topic:  Group Therapy: Avoiding Self-Sabotaging and Enabling Behaviors  Participation Level:  Active   Description of Group:   In this group, patients will learn how to identify obstacles, self-sabotaging and enabling behaviors, as well as: what are they, why do we do them and what needs these behaviors meet. Discuss unhealthy relationships and how to have positive healthy boundaries with those that sabotage and enable. Explore aspects of self-sabotage and enabling in yourself and how to limit these self-destructive behaviors in everyday life.   Therapeutic Goals: 1. Patient will identify one obstacle that relates to self-sabotage and enabling behaviors 2. Patient will identify one personal self-sabotaging or enabling behavior they did prior to admission 3. Patient will state a plan to change the above identified behavior 4. Patient will demonstrate ability to communicate their needs through discussion and/or role play.   Summary of Patient Progress:  Pt was attentive and engaged during group. She shared that her anxiety continues to bother her and get in the way of her recovery. Pt also shared that "not listening to the advice of others" tends to be her form of self sabotage. [name] continues to demonstrate some anxiety and mood lability, but is redirectable. She continues to demonstrate progress in the group setting with improving insight.    Therapeutic Modalities:   Cognitive Behavioral Therapy Person-Centered Therapy Motivational Interviewing   Pulte HomesHeather N Smart, LCSW 12/02/2016 11:29 AM

## 2016-12-02 NOTE — Progress Notes (Signed)
Nursing Progress Note 1900-0730  D) Patient presents with anxious mood and is irritable with negative attitudes and intrusive on the unit. Patient attended group this evening. Patient is up in the dayroom. Patient requested her sleep medications early but was agreeable to taking them at scheduled 2100. Patient denies SI/HI/AVH or pain. Patient contracts for safety on the unit. Patient compliant with medications. Patient states to Clinical research associatewriter, "they don't know what they're doing here. I need a Haldol shot to the butt". Writer offered to talk with patient 1:1 about concerns/complaints. Patient declined at this time.  A) Emotional support given. 1:1 interaction and active listening provided. Patient medicated as prescribed. Medications and plan of care reviewed with patient. Patient verbalized understanding without further questions. Snacks and fluids provided. Opportunities for questions or concerns presented to patient. Patient encouraged to continue to work on treatment goals. Labs, vital signs and patient behavior monitored throughout shift. Patient safety maintained with q15 min safety checks. Low fall risk precautions in place and reviewed with patient; patient verbalized understanding.  R) Patient receptive to interaction with nurse. Patient remains safe on the unit at this time. Patient denies any adverse medication reactions at this time. Will continue to monitor.

## 2016-12-02 NOTE — Plan of Care (Signed)
Problem: Education: Goal: Knowledge of [facility] Education information/materials will improve Outcome: Progressing Nurse discussed anxiety/depression/coping skills with patient.

## 2016-12-02 NOTE — Progress Notes (Signed)
D:  Patient's self inventory sheet, patient has fair sleep, sleep medication helpful but needs more.  Fair appetite, hyper energy level, poor concentration.  Rated depression 9, hopeless and anxiety 10.  Withdrawals, checked cravings, agitation, irritability.  SI, sometimes, contracts for safety.  Physical pain back pain, headache, blurred vision.  Back, neck, head #7.  Goal is to find home after discharge.  [facility]  Plans to talk to MD.  Needs to talk to SW about discharge plans. A:  Medications administered per MD orders.  Emotional support and encouragement given patient. R:  Denied SI and HI while talking to nurse this morning.  SI checked on self inventory sheet, contracts for safety.  Denied A/V hallucinations.  Safety maintained with 15 minute checks.

## 2016-12-02 NOTE — Progress Notes (Signed)
AC talked to patient about her loud voice and behavior and patient has been more calm before dinner and after dinner.

## 2016-12-02 NOTE — Progress Notes (Addendum)
[facility] MD Progress Note  12/02/2016 11:14 AM Kathy [name]  MRN:  [id number] Subjective:  Patient reports she is still anxious, and reports a subjective sense of racing thoughts. States " I seem to interrupt people and talk a lot , even though I don't want to ". She also reports ongoing nightmares, and states " Minipress really helps, but I need a higher dose " Denies medication side effects. Denies suicidal ideations- is future oriented, and states she hopes to be able to go to a rehab at discharge . Objective :  I have discussed case with treatment team and have met with patient.  31 year old female - reports history of polysubstance dependence ( describes opiates as substance of choice), states she has been diagnosed with Bipolar Disorder and ADHD in the past .  As per staff report patient presents with some restlessness, hyperactivity, rapid speech. Complains of feeling " wired". Reports she is feeling " a little better, but as above, describes ongoing subjective sense of restlessness, racing thoughts, pressured speech.  Patient states that she feels Seroquel is well tolerated, but that current dose is insufficient for her symptoms.' Similarly , she requests to titrate Minipress dose to address nightmares . Denies significant withdrawal symptoms, and does not appear to be in any acute distress . She is future oriented, and wants to go to a rehab at discharge.    Principal Problem: Polysubstance dependence including opioid type drug, continuous use ([place]) Diagnosis:   Patient Active Problem List   Diagnosis Date Noted  . Polysubstance (excluding opioids) dependence, daily use ([place]) [F19.20] 11/30/2016  . Polysubstance dependence including opioid type drug, continuous use ([place]) [F11.20, F19.20] 11/30/2016  . Substance induced mood disorder ([place]) [F19.94] 03/24/2016  . History of 3 spontaneous abortions [Z87.42] 03/24/2016  . Eczema [L30.9] 03/24/2016  . Depression [F32.9] 03/24/2016  .  Asthma without status asthmaticus without complication [R15.945] 85/92/9244  . Anxiety [F41.9] 03/24/2016  . Opiate abuse, continuous ([place]) [F11.10] 03/24/2016  . Fibromyalgia [M79.7] 04/17/2015  . Chronic hepatitis C virus infection ([place]) [B18.2] 02/28/2015  . Chronic migraine [G43.709] 07/23/2014  . Bipolar disorder ([place]) [F31.9] 05/10/2013  . PTSD (post-traumatic stress disorder) [F43.10] 05/10/2013  . Substance abuse ([place]) [F19.10] 10/03/2012   Total Time spent with patient: 20 minutes  Past Psychiatric History:   Past Medical History:  Past Medical History:  Diagnosis Date  . Anxiety   . Bipolar disorder ([place])   . Compression fracture of spine ([place])   . Fibromyalgia   . Headache   . PTSD (post-traumatic stress disorder)   . Seizures ([place])    Related to benzo withdrawal    Past Surgical History:  Procedure Laterality Date  . HAND SURGERY Right    Family History: History reviewed. No pertinent family history. Family Psychiatric  History:  Social History:  History  Alcohol Use  . Yes    Comment: Daily. Last drink: PTA  from [facility], Drinks beer and liquor.      History  Drug Use  . Types: Cocaine, Benzodiazepines, Heroin, Hydrocodone    Comment: Cocaine: last used; Heroin: last used 09/17/2016;     Social History   Social History  . Marital status: Single    Spouse name: N/A  . Number of children: N/A  . Years of education: N/A   Social History Main Topics  . Smoking status: Current Every Day Smoker    Packs/day: 1.00    Types: Cigarettes  . Smokeless tobacco: Never Used  Comment: Pt does not want to stop smoking at this time  . Alcohol use Yes     Comment: Daily. Last drink: PTA  from [facility], Drinks beer and liquor.   . Drug use: Yes    Types: Cocaine, Benzodiazepines, Heroin, Hydrocodone     Comment: Cocaine: last used; Heroin: last used 09/17/2016;   . Sexual activity: Yes    Birth control/ protection: Injection     Comment: Depo Provera   Other  Topics Concern  . None   Social History Narrative  . None    Sleep: Fair  Appetite:  Fair  Current Medications: Current Facility-Administered Medications  Medication Dose Route Frequency Provider Last Rate Last Dose  . acetaminophen (TYLENOL) tablet 650 mg  650 mg Oral Q6H PRN Laverle Hobby, PA-C   650 mg at 12/01/16 0758  . alum & mag hydroxide-simeth (MAALOX/MYLANTA) 200-200-20 MG/5ML suspension 30 mL  30 mL Oral Q4H PRN Patriciaann Clan E, PA-C      . chlordiazePOXIDE (LIBRIUM) capsule 25 mg  25 mg Oral Q6H PRN Laverle Hobby, PA-C   25 mg at 12/01/16 2220  . chlordiazePOXIDE (LIBRIUM) capsule 25 mg  25 mg Oral BH-qamhs Patriciaann Clan E, PA-C   25 mg at 12/02/16 0750   Followed by  . [START ON 12/03/2016] chlordiazePOXIDE (LIBRIUM) capsule 25 mg  25 mg Oral Daily Simon, Spencer E, PA-C      . FLUoxetine (PROZAC) capsule 10 mg  10 mg Oral Daily Lindell Spar I, NP   10 mg at 12/02/16 0747  . gabapentin (NEURONTIN) capsule 600 mg  600 mg Oral [id number] Laverle Hobby, PA-C   600 mg at 12/02/16 0747  . hydrOXYzine (ATARAX/VISTARIL) tablet 50 mg  50 mg Oral QHS Lindell Spar I, NP   50 mg at 12/01/16 2107  . hydrOXYzine (ATARAX/VISTARIL) tablet 50 mg  50 mg Oral Q6H PRN Pennelope Bracken, MD   50 mg at 12/01/16 1826  . loperamide (IMODIUM) capsule 2-4 mg  2-4 mg Oral PRN Patriciaann Clan E, PA-C      . magnesium hydroxide (MILK OF MAGNESIA) suspension 30 mL  30 mL Oral Daily PRN Laverle Hobby, PA-C      . multivitamin with minerals tablet 1 tablet  1 tablet Oral Daily Laverle Hobby, PA-C   1 tablet at 12/02/16 0747  . nicotine polacrilex (NICORETTE) gum 2 mg  2 mg Oral PRN Lindell Spar I, NP   2 mg at 12/02/16 0754  . ondansetron (ZOFRAN-ODT) disintegrating tablet 4 mg  4 mg Oral Q6H PRN Laverle Hobby, PA-C      . prazosin (MINIPRESS) capsule 2 mg  2 mg Oral QHS Cobos, Myer Peer, MD      . QUEtiapine (SEROQUEL) tablet 50 mg  50 mg Oral BID Cobos, Myer Peer, MD        And  . QUEtiapine (SEROQUEL) tablet 200 mg  200 mg Oral QHS Cobos, Fernando A, MD      . QUEtiapine (SEROQUEL) tablet 25 mg  25 mg Oral Q12H PRN Pennelope Bracken, MD   25 mg at 12/01/16 1629  . thiamine (B-1) injection 100 mg  100 mg Intramuscular Once Patriciaann Clan E, PA-C      . thiamine (VITAMIN B-1) tablet 100 mg  100 mg Oral Daily Laverle Hobby, PA-C   100 mg at 12/02/16 8366    Lab Results:  No results found for this or any previous visit (from the  past 48 hour(s)).  Blood Alcohol level:  Lab Results  Component Value Date   ETH <10 11/29/2016   ETH <5 86/76/7209    Metabolic Disorder Labs: Lab Results  Component Value Date   HGBA1C 5.2 03/24/2016   MPG 103 03/24/2016   Lab Results  Component Value Date   PROLACTIN 54.1 (H) 03/24/2016   Lab Results  Component Value Date   CHOL 164 03/24/2016   TRIG 99 03/24/2016   HDL 47 03/24/2016   CHOLHDL 3.5 03/24/2016   VLDL 20 03/24/2016   LDLCALC 97 03/24/2016    Physical Findings: AIMS: Facial and Oral Movements Muscles of Facial Expression: None, normal Lips and Perioral Area: None, normal Jaw: None, normal Tongue: None, normal,Extremity Movements Upper (arms, wrists, hands, fingers): None, normal Lower (legs, knees, ankles, toes): None, normal, Trunk Movements Neck, shoulders, hips: None, normal, Overall Severity Severity of abnormal movements (highest score from questions above): None, normal Incapacitation due to abnormal movements: None, normal Patient's awareness of abnormal movements (rate only patient's report): No Awareness, Dental Status Current problems with teeth and/or dentures?: No Does patient usually wear dentures?: No  CIWA:  CIWA-Ar Total: 5 COWS:  COWS Total Score: 8  Musculoskeletal: Strength & Muscle Tone: within normal limits Gait & Station: normal Patient leans: N/A  Psychiatric Specialty Exam: Physical Exam  Review of Systems  Constitutional: Negative.   Cardiovascular:  Negative.   Gastrointestinal: Negative.   Skin: Negative.   Neurological: Negative.   denies headache, denies chest pain, no shortness of breath  Blood pressure 123/75, pulse (!) 113, temperature 98.5 F (36.9 C), temperature source Oral, resp. rate 16, height '5\' 4"'  (1.626 m), weight 74.8 kg (165 lb).Body mass index is 28.32 kg/m.  General Appearance: Fairly Groomed  Eye Contact:  Good  Speech:  Normal Rate and pressured at times   Volume:  Normal  Mood:  reports mood " a little better", but remains anxious, states she feels " wired "  Affect:  anxious   Thought Process:  Linear and Descriptions of Associations: Intact- no flight of ideations noted   Orientation:  Other:  fully alert and attentive   Thought Content:  denies hallucinations, no delusions expressed   Suicidal Thoughts:  No- currently denies suicidal or self injurious ideations and denies any homicidal or violent ideations  Homicidal Thoughts:  No  Memory:  recent and remote grossly intact   Judgement:  Fair  Insight:  Fair  Psychomotor Activity:  Normal and slilghtly restless, fidgety at times  Concentration:  Concentration: Good and Attention Span: Good  Recall:  Good  Fund of Knowledge:  Good  Language:  Good  Akathisia:  No- does not present akathetic or overtly restless at this time  Handed:  R  AIMS (if indicated):     Assets:  Desire for Improvement  ADL's:  Intact  Cognition:  WNL  Sleep:  Number of Hours: 6.5   Assessment - patient reports partial improvement compared to admission, but reports ongoing subjective racing thoughts, mood lability, anxiety, and presents with intermittently pressured speech. States Seroquel helping partially. Denies side effects at this time Currently not presenting with acute withdrawal symptoms and does not appear to be in any acute distress   Treatment Plan Summary: Treatment plan reviewed as below today 10/18  Daily contact with patient to assess and evaluate symptoms and  progress in treatment and Medication management  Encourage ongoing group and milieu participation to work on coping skills and symptom reduction  Encourage  efforts to work on sobriety and abstinence Treatment team working on disposition planning options- patient expressing interest of going to a rehab at discharge  Increase Seroquel to 50 mgrs BID and 200 mgrs QHS for mood disorder Increase Minipress to 2 mgrs QH"S for nightmares  Currently completing Librium detox protocol, to minimize risk of WDL symptoms. Continue Prozac 10 mgrs QDAY for anxiety  ( has been tapered down from 40 mgrs QDAY) - we reviewed option of D/C ing Prozac, but patient states it has worked for anxiety , denies side effects.   Jenne Campus, MD 12/02/2016, 11:14 AM   Patient ID: Kathy [name], female   DOB: 21-Oct-1985, 31 y.o.   MRN: [id number]

## 2016-12-03 DIAGNOSIS — Z79899 Other long term (current) drug therapy: Secondary | ICD-10-CM

## 2016-12-03 MED ORDER — QUETIAPINE FUMARATE 300 MG PO TABS
300.0000 mg | ORAL_TABLET | Freq: Every day | ORAL | Status: DC
  Administered 2016-12-03 – 2016-12-05 (×3): 300 mg via ORAL
  Filled 2016-12-03 (×4): qty 1
  Filled 2016-12-03: qty 21

## 2016-12-03 MED ORDER — QUETIAPINE FUMARATE 50 MG PO TABS
50.0000 mg | ORAL_TABLET | Freq: Once | ORAL | Status: AC
  Administered 2016-12-03: 50 mg via ORAL
  Filled 2016-12-03: qty 1

## 2016-12-03 MED ORDER — QUETIAPINE FUMARATE 50 MG PO TABS: 50.0000 mg | ORAL_TABLET | Freq: Once | ORAL | Status: DC

## 2016-12-03 MED ORDER — HYDROXYZINE HCL 50 MG PO TABS
50.0000 mg | ORAL_TABLET | Freq: Three times a day (TID) | ORAL | Status: DC | PRN
  Administered 2016-12-03 – 2016-12-05 (×4): 50 mg via ORAL
  Filled 2016-12-03 (×2): qty 1
  Filled 2016-12-03: qty 30
  Filled 2016-12-03 (×2): qty 1

## 2016-12-03 MED ORDER — QUETIAPINE FUMARATE 100 MG PO TABS
100.0000 mg | ORAL_TABLET | Freq: Every day | ORAL | Status: DC
  Administered 2016-12-04 – 2016-12-06 (×3): 100 mg via ORAL
  Filled 2016-12-03 (×6): qty 1
  Filled 2016-12-03: qty 21

## 2016-12-03 NOTE — [facility] Group Notes (Signed)
LCSW Group Therapy Note  12/03/2016 1:15pm  Type of Therapy and Topic:  Group Therapy:  Feelings around Relapse and Recovery  Participation Level:  Did Not Attend --pt had phone screening with [facility] during group time. EXCUSED from group.   Description of Group:    Patients in this group will discuss emotions they experience before and after a relapse. They will process how experiencing these feelings, or avoidance of experiencing them, relates to having a relapse. Facilitator will guide patients to explore emotions they have related to recovery. Patients will be encouraged to process which emotions are more powerful. They will be guided to discuss the emotional reaction significant others in their lives may have to their relapse or recovery. Patients will be assisted in exploring ways to respond to the emotions of others without this contributing to a relapse.  Therapeutic Goals: 1. Patient will identify two or more emotions that lead to a relapse for them 2. Patient will identify two emotions that result when they relapse 3. Patient will identify two emotions related to recovery 4. Patient will demonstrate ability to communicate their needs through discussion and/or role plays   Summary of Patient Progress:     Therapeutic Modalities:   Cognitive Behavioral Therapy Solution-Focused Therapy Assertiveness Training Relapse Prevention Therapy   Ledell PeoplesHeather N Smart, LCSW 12/03/2016 2:23 PM

## 2016-12-03 NOTE — Progress Notes (Signed)
Recreation Therapy Notes  Date:  12/03/16  Time: 1000 Location: 500 Hall Dayroom  Group Topic: Stress Management  Goal Area(s) Addresses:  Patient will verbalize importance of using healthy stress management.  Patient will identify positive emotions associated with healthy stress management.   Intervention: Stress Management  Activity :  Guided Imagery.  LRT introduced the stress management technique of guided imagery.  Patients were to listen and follow along as LRT read script for patients to embark on a mental vacation.  Education:  Stress Management, Discharge Planning.   Education Outcome: Acknowledges edcuation/In group clarification offered/Needs additional education  Clinical Observations/Feedback: Pt did not attend group.    Delynda Sepulveda, LRT/CTRS         Kenslie Abbruzzese A 12/03/2016 11:59 AM 

## 2016-12-03 NOTE — Progress Notes (Signed)
Kathy [name] is loud, focused on medications and demonstrates little to no insight into her drug addiction and her unhealthy choices. A SHe did complete her daily assessment this morning . On this , she wrote she denied SI today and she rated her depression, hopelessness and anxiety" 10/25/08", respectively. She has struggled today with her anxiety, presenting to this Clinical research associatewriter on multiple occasions saying " I've got to have something else...will you give me some more medicine?". She was gvien a prn dose of seroquel 25 mg po and vistaril 25 mg po, per MD order. She is seen by this writer talking on the phone many times, she is seen playing cards, watching TV and laughing with other patients. She tolerates this well. R Safety is in place.

## 2016-12-03 NOTE — Progress Notes (Signed)
[facility]  12/03/2016 2:22 PM Kathy [name]  MRN:  [id number]   Subjective:  Patient present in the hallway after her phone interview with [facility]. She denies any SI/HI/AVH and depression. She reports "I feel like I'm off the chain."  Objective: Patient's chart and findings reviewed and discussed with treatment team. Patient has been seen being loud, hyperverbal, slightly pressured speech, but an improvement since admission. Will have Seroquel managed to slightly increase and have it BID for ease of administration.   Principal Problem: Polysubstance dependence including opioid type drug, continuous use (HCC) Diagnosis:   Patient Active Problem List   Diagnosis Date Noted  . Polysubstance (excluding opioids) dependence, daily use (HCC) [F19.20] 11/30/2016  . Polysubstance dependence including opioid type drug, continuous use (HCC) [F11.20, F19.20] 11/30/2016  . Substance induced mood disorder (HCC) [F19.94] 03/24/2016  . History of 3 spontaneous abortions [Z87.42] 03/24/2016  . Eczema [L30.9] 03/24/2016  . Depression [F32.9] 03/24/2016  . Asthma without status asthmaticus without complication [J45.909] 03/24/2016  . Anxiety [F41.9] 03/24/2016  . Opiate abuse, continuous (HCC) [F11.10] 03/24/2016  . Fibromyalgia [M79.7] 04/17/2015  . Chronic hepatitis C virus infection (HCC) [B18.2] 02/28/2015  . Chronic migraine [G43.709] 07/23/2014  . Bipolar disorder (HCC) [F31.9] 05/10/2013  . PTSD (post-traumatic stress disorder) [F43.10] 05/10/2013  . Substance abuse (HCC) [F19.10] 10/03/2012   Total Time spent with patient: 25 minutes  Past Psychiatric History: See H&P  Past Medical History:  Past Medical History:  Diagnosis Date  . Anxiety   . Bipolar disorder (HCC)   . Compression fracture of spine (HCC)   . Fibromyalgia   . Headache   . PTSD (post-traumatic stress disorder)   . Seizures (HCC)    Related to benzo withdrawal    Past Surgical History:  Procedure Laterality  Date  . HAND SURGERY Right    Family History: History reviewed. No pertinent family history. Family Psychiatric  History: See H&P Social History:  History  Alcohol Use  . Yes    Comment: Daily. [facility], Drinks beer and liquor.      History  Drug Use  . Types: Cocaine, Benzodiazepines, Heroin, Hydrocodone    Comment: Cocaine: last used; Heroin: last used 09/17/2016;     Social History   Social History  . Marital status: Single    Spouse name: N/A  . Number of children: N/A  . Years of education: N/A   Social History Main Topics  . Smoking status: Current Every Day Smoker    Packs/day: 1.00    Types: Cigarettes  . Smokeless tobacco: Never Used     Comment: Pt does not want to stop smoking at this time  . Alcohol use Yes     Comment: Daily. [facility], Drinks beer and liquor.   . Drug use: Yes    Types: Cocaine, Benzodiazepines, Heroin, Hydrocodone     Comment: Cocaine: last used; Heroin: last used 09/17/2016;   . Sexual activity: Yes    Birth control/ protection: Injection     Comment: Depo Provera   Other Topics Concern  . None   Social History Narrative  . None   Additional Social History:                         Sleep: Good  Appetite:  Good  Current Medications: Current Facility-Administered Medications  Medication Dose Route Frequency Provider Last Rate Last Dose  . acetaminophen (  TYLENOL) tablet 650 mg  650 mg Oral Q6H PRN Kerry Hough, PA-C   650 mg at 12/03/16 9147  . alum & mag hydroxide-simeth (MAALOX/MYLANTA) 200-200-20 MG/5ML suspension 30 mL  30 mL Oral Q4H PRN Kerry Hough, PA-C      . FLUoxetine (PROZAC) capsule 10 mg  10 mg Oral Daily Armandina Stammer I, NP   10 mg at 12/03/16 0807  . gabapentin (NEURONTIN) capsule 600 mg  600 mg Oral [id number] Kerry Hough, PA-C   600 mg at 12/03/16 8295  . hydrOXYzine (ATARAX/VISTARIL) tablet 50 mg  50 mg Oral QHS Armandina Stammer I, NP   50 mg at 12/02/16 2101   . magnesium hydroxide (MILK OF MAGNESIA) suspension 30 mL  30 mL Oral Daily PRN Kerry Hough, PA-C      . multivitamin with minerals tablet 1 tablet  1 tablet Oral Daily Kerry Hough, PA-C   1 tablet at 12/03/16 6213  . nicotine polacrilex (NICORETTE) gum 2 mg  2 mg Oral PRN Armandina Stammer I, NP   2 mg at 12/03/16 0933  . prazosin (MINIPRESS) capsule 2 mg  2 mg Oral QHS Cobos, Rockey Situ, MD   2 mg at 12/02/16 2100  . [START ON 12/04/2016] QUEtiapine (SEROQUEL) tablet 100 mg  100 mg Oral Daily Money, Gerlene Burdock, FNP       And  . QUEtiapine (SEROQUEL) tablet 300 mg  300 mg Oral QHS Money, Gerlene Burdock, FNP      . QUEtiapine (SEROQUEL) tablet 25 mg  25 mg Oral Q12H PRN Micheal Likens, MD   25 mg at 12/03/16 0933  . QUEtiapine (SEROQUEL) tablet 50 mg  50 mg Oral Once Money, Gerlene Burdock, FNP      . thiamine (B-1) injection 100 mg  100 mg Intramuscular Once Donell Sievert E, PA-C      . thiamine (VITAMIN B-1) tablet 100 mg  100 mg Oral Daily Kerry Hough, PA-C   100 mg at 12/03/16 0865    Lab Results: No results found for this or any previous visit (from the past 48 hour(s)).  Blood Alcohol level:  Lab Results  Component Value Date   ETH <10 11/29/2016   ETH <5 03/23/2016    Metabolic Disorder Labs: Lab Results  Component Value Date   HGBA1C 5.2 03/24/2016   MPG 103 03/24/2016   Lab Results  Component Value Date   PROLACTIN 54.1 (H) 03/24/2016   Lab Results  Component Value Date   CHOL 164 03/24/2016   TRIG 99 03/24/2016   HDL 47 03/24/2016   CHOLHDL 3.5 03/24/2016   VLDL 20 03/24/2016   LDLCALC 97 03/24/2016    Physical Findings: AIMS: Facial and Oral Movements Muscles of Facial Expression: None, normal Lips and Perioral Area: None, normal Jaw: None, normal Tongue: None, normal,Extremity Movements Upper (arms, wrists, hands, fingers): None, normal Lower (legs, knees, ankles, toes): None, normal, Trunk Movements Neck, shoulders, hips: None, normal, Overall  Severity Severity of abnormal movements (highest score from questions above): None, normal Incapacitation due to abnormal movements: None, normal Patient's awareness of abnormal movements (rate only patient's report): No Awareness, Dental Status Current problems with teeth and/or dentures?: No Does patient usually wear dentures?: No  CIWA:  CIWA-Ar Total: 3 COWS:  COWS Total Score: 2  Musculoskeletal: Strength & Muscle Tone: within normal limits Gait & Station: normal Patient leans: N/A  Psychiatric Specialty Exam: Physical Exam  Nursing note and vitals reviewed. Constitutional: She is  oriented to person, place, and time. She appears well-developed and well-nourished.  Respiratory: Effort normal.  Musculoskeletal: Normal range of motion.  Neurological: She is alert and oriented to person, place, and time.  Skin: Skin is warm.    Review of Systems  Constitutional: Negative.   HENT: Negative.   Eyes: Negative.   Respiratory: Negative.   Cardiovascular: Negative.   Gastrointestinal: Negative.   Genitourinary: Negative.   Musculoskeletal: Negative.   Skin: Negative.   Neurological: Negative.   Endo/Heme/Allergies: Negative.   Psychiatric/Behavioral: Negative for depression, hallucinations and suicidal ideas. The patient is nervous/anxious.     Blood pressure 122/82, pulse (!) 120, temperature 98.6 F (37 C), temperature source Oral, resp. rate 18, height 5\' 4"  (1.626 m), weight 74.8 kg (165 lb), SpO2 96 %.Body mass index is 28.32 kg/m.  General Appearance: Casual  Eye Contact:  Good  Speech:  Clear and Coherent and Normal Rate  Volume:  Normal  Mood:  Anxious  Affect:  Congruent  Thought Process:  Goal Directed and Descriptions of Associations: Intact  Orientation:  Full (Time, Place, and Person)  Thought Content:  WDL  Suicidal Thoughts:  No  Homicidal Thoughts:  No  Memory:  Immediate;   Good Recent;   Good Remote;   Good  Judgement:  Good  Insight:  Good   Psychomotor Activity:  Normal  Concentration:  Concentration: Good and Attention Span: Good  Recall:  Good  Fund of Knowledge:  Good  Language:  Good  Akathisia:  No  Handed:  Right  AIMS (if indicated):     Assets:  Communication Skills Desire for Improvement Financial Resources/Insurance Housing Physical Health Social Support Transportation  ADL's:  Intact  Cognition:  WNL  Sleep:  Number of Hours: 5.75   Problems Addressed:  Polysubstance dependence Anxiety Substance induced mood disorder  Treatment Plan Summary: Daily contact with patient to assess and evaluate symptoms and progress in treatment, Medication management and Plan is to:  -Change Seroquel to 100 mg PO Daily and 300 mg PO QHS for mood stability -Continue Seroquel 25 mg PO BID for agitation/psychosis -Continue Prazosin 2 mg PO QHS for PTSD -Continue Neurontin 600 mg PO TID for withdrawal symptoms -Increase Vistaril 50 mg PO TID PRN for anxiety  -Encourage group therapy participation  -Continue Prozac 10 mg PO Daily for mood stability Continue Librium Protocol    Gerlene Burdock Money, FNP 12/03/2016, 2:22 PM   Agree with NP Progress Note

## 2016-12-03 NOTE — Progress Notes (Signed)
Writer has observed patient up in the dayroom earlier coloring and listening to music with select peers. Writer introduced self to her and she reported that she needed medicine but knew it was not time yet. She has been anxious and talked about her medications not being enough even after she received them. Writer encouraged her to use coloring and talking with peers as a way to distract her thoughts so much on her medications. Support given and safety maintained on unit with 15 min checks.

## 2016-12-04 DIAGNOSIS — F29 Unspecified psychosis not due to a substance or known physiological condition: Secondary | ICD-10-CM

## 2016-12-04 DIAGNOSIS — F1310 Sedative, hypnotic or anxiolytic abuse, uncomplicated: Secondary | ICD-10-CM

## 2016-12-04 DIAGNOSIS — F1410 Cocaine abuse, uncomplicated: Secondary | ICD-10-CM

## 2016-12-04 DIAGNOSIS — F39 Unspecified mood [affective] disorder: Secondary | ICD-10-CM

## 2016-12-04 MED ORDER — BENZTROPINE MESYLATE 0.5 MG PO TABS
0.5000 mg | ORAL_TABLET | Freq: Two times a day (BID) | ORAL | Status: DC
  Administered 2016-12-04 – 2016-12-06 (×4): 0.5 mg via ORAL
  Filled 2016-12-04 (×5): qty 1
  Filled 2016-12-04 (×2): qty 42
  Filled 2016-12-04 (×2): qty 1

## 2016-12-04 MED ORDER — ALBUTEROL SULFATE HFA 108 (90 BASE) MCG/ACT IN AERS
2.0000 | INHALATION_SPRAY | Freq: Four times a day (QID) | RESPIRATORY_TRACT | Status: DC | PRN
  Administered 2016-12-04 – 2016-12-05 (×4): 2 via RESPIRATORY_TRACT

## 2016-12-04 MED ORDER — DIVALPROEX SODIUM 500 MG PO DR TAB
500.0000 mg | DELAYED_RELEASE_TABLET | Freq: Two times a day (BID) | ORAL | Status: DC
  Administered 2016-12-04 – 2016-12-06 (×4): 500 mg via ORAL
  Filled 2016-12-04: qty 42
  Filled 2016-12-04 (×2): qty 1
  Filled 2016-12-04: qty 42
  Filled 2016-12-04 (×5): qty 1

## 2016-12-04 MED ORDER — ALBUTEROL SULFATE HFA 108 (90 BASE) MCG/ACT IN AERS
INHALATION_SPRAY | RESPIRATORY_TRACT | Status: AC
  Administered 2016-12-04: 2 via RESPIRATORY_TRACT
  Filled 2016-12-04: qty 6.7

## 2016-12-04 NOTE — [facility] Group Notes (Signed)
LCSW Group Therapy Note  12/04/2016     10:00-11:00AM  Type of Therapy and Topic:  Group Therapy:  Decisional Balance/Substance Use  Participation Level:  None        . Description of Group:  The main focus of today's process group was learning how to use a decisional balance exercise to make a decision about whether to change an unhealthy coping skill, as well as how to use the information gathered in the actual process of planning that change.  Patients listed some of their most frequently utilized unhealthy coping techniques and CSW pointed out the similarities.  Motivational Interviewing and the whiteboard were utilized to help patients explore in-depth the perceived benefits and costs of a specific, shared unhealthy coping technique (drinking & drugging) as well as the benefits and costs of replacing that with other, healthy coping skills.  A handout was distributed for patients to be able to do this exercise for themselves.     Therapeutic Goals 1. Patient will be able to utilize the decision balance exercise on their own 2. Patient will list coping skills they use to fulfill their needs 3. Patient will identify the differences between healthy and  unhealthy coping skills 4. Patient will verbalize the costs and benefits of drinking/drugging versus making the choice to change 5. Patient will learn how to use the exercise to identify the most important supports to put in place so that they can succeed in a change to which they commit  Summary of Patient Progress: During group, patient kept coming in and out of the room and seemed angry each time.   Therapeutic Modalities Cognitive Behavioral Therapy Motivational Interviewing   Lynnell ChadMareida J Grossman-Orr, LCSW

## 2016-12-04 NOTE — Progress Notes (Signed)
[facility] MD Progress Note  12/04/2016 12:59 PM Kathy [name]  MRN:  [id number]   Subjective: patient stated that she has been doing well, and feels hyperactive, pressured speech, restless and unable to control her behaviors on the unit.  Objective: Patient's chart and findings reviewed and discussed with treatment team. Patient has been manic with disruptive behaviors, pressured speech, restless and constantly reporting she is not doing well she needed medication adjustment. Patient has been on phone for hours at a time with multiple complaints other side of the people. Staff members reported patient has been very disruptive to the therapeutic milieu. Patient is interested in participating in a substance abuse rehabilitation program when medically and psychiatricallycleared.  Principal Problem: Polysubstance dependence including opioid type drug, continuous use (HCC) Diagnosis:   Patient Active Problem List   Diagnosis Date Noted  . Polysubstance (excluding opioids) dependence, daily use (HCC) [F19.20] 11/30/2016  . Polysubstance dependence including opioid type drug, continuous use (HCC) [F11.20, F19.20] 11/30/2016  . Substance induced mood disorder (HCC) [F19.94] 03/24/2016  . History of 3 spontaneous abortions [Z87.42] 03/24/2016  . Eczema [L30.9] 03/24/2016  . Depression [F32.9] 03/24/2016  . Asthma without status asthmaticus without complication [J45.909] 03/24/2016  . Anxiety [F41.9] 03/24/2016  . Opiate abuse, continuous (HCC) [F11.10] 03/24/2016  . Fibromyalgia [M79.7] 04/17/2015  . Chronic hepatitis C virus infection (HCC) [B18.2] 02/28/2015  . Chronic migraine [G43.709] 07/23/2014  . Bipolar disorder (HCC) [F31.9] 05/10/2013  . PTSD (post-traumatic stress disorder) [F43.10] 05/10/2013  . Substance abuse (HCC) [F19.10] 10/03/2012   Total Time spent with patient: 25 minutes  Past Psychiatric History: See H&P  Past Medical History:  Past Medical History:  Diagnosis Date  .  Anxiety   . Bipolar disorder (HCC)   . Compression fracture of spine (HCC)   . Fibromyalgia   . Headache   . PTSD (post-traumatic stress disorder)   . Seizures (HCC)    Related to benzo withdrawal    Past Surgical History:  Procedure Laterality Date  . HAND SURGERY Right    Family History: History reviewed. No pertinent family history. Family Psychiatric  History: See H&P Social History:  History  Alcohol Use  . Yes    Comment: Daily. Last drink: PTA  from [facility], Drinks beer and liquor.      History  Drug Use  . Types: Cocaine, Benzodiazepines, Heroin, Hydrocodone    Comment: Cocaine: last used; Heroin: last used 09/17/2016;     Social History   Social History  . Marital status: Single    Spouse name: N/A  . Number of children: N/A  . Years of education: N/A   Social History Main Topics  . Smoking status: Current Every Day Smoker    Packs/day: 1.00    Types: Cigarettes  . Smokeless tobacco: Never Used     Comment: Pt does not want to stop smoking at this time  . Alcohol use Yes     Comment: Daily. Last drink: PTA  from [facility], Drinks beer and liquor.   . Drug use: Yes    Types: Cocaine, Benzodiazepines, Heroin, Hydrocodone     Comment: Cocaine: last used; Heroin: last used 09/17/2016;   . Sexual activity: Yes    Birth control/ protection: Injection     Comment: Depo Provera   Other Topics Concern  . None   Social History Narrative  . None   Additional Social History:  Sleep: Good  Appetite:  Good  Current Medications: Current Facility-Administered Medications  Medication Dose Route Frequency Provider Last Rate Last Dose  . acetaminophen (TYLENOL) tablet 650 mg  650 mg Oral Q6H PRN Kerry HoughSimon, Spencer E, PA-C   650 mg at 12/03/16 0933  . alum & mag hydroxide-simeth (MAALOX/MYLANTA) 200-200-20 MG/5ML suspension 30 mL  30 mL Oral Q4H PRN Kerry HoughSimon, Spencer E, PA-C      . FLUoxetine (PROZAC) capsule 10 mg  10 mg Oral Daily Armandina StammerNwoko, Agnes  I, NP   10 mg at 12/04/16 0748  . gabapentin (NEURONTIN) capsule 600 mg  600 mg Oral [id number] Kerry HoughSimon, Spencer E, PA-C   600 mg at 12/04/16 [id number]  . hydrOXYzine (ATARAX/VISTARIL) tablet 50 mg  50 mg Oral TID PRN Money, Gerlene Burdockravis B, FNP   50 mg at 12/03/16 2000  . magnesium hydroxide (MILK OF MAGNESIA) suspension 30 mL  30 mL Oral Daily PRN Kerry HoughSimon, Spencer E, PA-C      . multivitamin with minerals tablet 1 tablet  1 tablet Oral Daily Kerry HoughSimon, Spencer E, PA-C   1 tablet at 12/04/16 0748  . nicotine polacrilex (NICORETTE) gum 2 mg  2 mg Oral PRN Armandina StammerNwoko, Agnes I, NP   2 mg at 12/04/16 [id number]  . prazosin (MINIPRESS) capsule 2 mg  2 mg Oral QHS Cobos, Rockey SituFernando A, MD   2 mg at 12/03/16 2108  . QUEtiapine (SEROQUEL) tablet 100 mg  100 mg Oral Daily Money, Gerlene Burdockravis B, FNP   100 mg at 12/04/16 [id number]   And  . QUEtiapine (SEROQUEL) tablet 300 mg  300 mg Oral QHS Money, Gerlene Burdockravis B, FNP   300 mg at 12/03/16 2108  . QUEtiapine (SEROQUEL) tablet 25 mg  25 mg Oral Q12H PRN Micheal Likensainville, Christopher T, MD   25 mg at 12/04/16 0755  . thiamine (B-1) injection 100 mg  100 mg Intramuscular Once Donell SievertSimon, Spencer E, PA-C      . thiamine (VITAMIN B-1) tablet 100 mg  100 mg Oral Daily Kerry HoughSimon, Spencer E, PA-C   100 mg at 12/04/16 [id number]    Lab Results: No results found for this or any previous visit (from the past 48 hour(s)).  Blood Alcohol level:  Lab Results  Component Value Date   ETH <10 11/29/2016   ETH <5 03/23/2016    Metabolic Disorder Labs: Lab Results  Component Value Date   HGBA1C 5.2 03/24/2016   MPG 103 03/24/2016   Lab Results  Component Value Date   PROLACTIN 54.1 (H) 03/24/2016   Lab Results  Component Value Date   CHOL 164 03/24/2016   TRIG 99 03/24/2016   HDL 47 03/24/2016   CHOLHDL 3.5 03/24/2016   VLDL 20 03/24/2016   LDLCALC 97 03/24/2016    Physical Findings: AIMS: Facial and Oral Movements Muscles of Facial Expression: None, normal Lips and Perioral Area: None, normal Jaw: None, normal Tongue:  None, normal,Extremity Movements Upper (arms, wrists, hands, fingers): None, normal Lower (legs, knees, ankles, toes): None, normal, Trunk Movements Neck, shoulders, hips: None, normal, Overall Severity Severity of abnormal movements (highest score from questions above): None, normal Incapacitation due to abnormal movements: None, normal Patient's awareness of abnormal movements (rate only patient's report): No Awareness, Dental Status Current problems with teeth and/or dentures?: No Does patient usually wear dentures?: No  CIWA:  CIWA-Ar Total: 5 COWS:  COWS Total Score: 2  Musculoskeletal: Strength & Muscle Tone: within normal limits Gait & Station: normal Patient leans: N/A  Psychiatric Specialty Exam: Physical Exam  Nursing note and vitals reviewed. Constitutional: She is oriented to person, place, and time. She appears well-developed and well-nourished.  Respiratory: Effort normal.  Musculoskeletal: Normal range of motion.  Neurological: She is alert and oriented to person, place, and time.  Skin: Skin is warm.    Review of Systems  Constitutional: Negative.   HENT: Negative.   Eyes: Negative.   Respiratory: Negative.   Cardiovascular: Negative.   Gastrointestinal: Negative.   Genitourinary: Negative.   Musculoskeletal: Negative.   Skin: Negative.   Neurological: Negative.   Endo/Heme/Allergies: Negative.   Psychiatric/Behavioral: Negative for depression, hallucinations and suicidal ideas. The patient is nervous/anxious.     Blood pressure 106/67, pulse (!) 118, temperature 98 F (36.7 C), temperature source Oral, resp. rate 18, height 5\' 4"  (1.626 m), weight 74.8 kg (165 lb), SpO2 96 %.Body mass index is 28.32 kg/m.  General Appearance: Casual  Eye Contact:  Good  Speech:  Clear and Coherent and Normal Rate  Volume:  Normal  Mood:  Anxious, depressed and not feeling well  Affect:  Congruent and labil  Thought Process:  Goal Directed and Descriptions of  Associations: Intact  Orientation:  Full (Time, Place, and Person)  Thought Content:  WDL rumination about medication therapy  Suicidal Thoughts:  No  Homicidal Thoughts:  No  Memory:  Immediate;   Good Recent;   Good Remote;   Good  Judgement:  Good  Insight:  Good  Psychomotor Activity:  Normal  Concentration:  Concentration: Good and Attention Span: Good  Recall:  Good  Fund of Knowledge:  Good  Language:  Good  Akathisia:  No  Handed:  Right  AIMS (if indicated):     Assets:  Communication Skills Desire for Improvement Financial Resources/Insurance Housing Physical Health Social Support Transportation  ADL's:  Intact  Cognition:  WNL  Sleep:  Number of Hours: 5.75   Problems Addressed:  Polysubstance dependence Anxiety Substance induced mood disorder  Treatment Plan Summary: Daily contact with patient to assess and evaluate symptoms and progress in treatment, Medication management and Plan is to:  We start Depakote ER 500 mg twice daily for mood swings, racing thoughts, and monitor for the valproic acid level for therapeutic window in 5 days -Continue Seroquel to 100 mg PO Daily and 300 mg PO QHS for mood stability -Continue Seroquel 25 mg PO BID for agitation/psychosis Will start Cogentin 0.5mg  PO BID for restless and EPS -Continue Prazosin 2 mg PO QHS for PTSD -Continue Neurontin 600 mg PO TID for withdrawal symptoms -Continue Vistaril 50 mg PO TID PRN for anxiety  -Encourage group therapy participation  -discontinue Prozac 10 mg not helpful Continue Librium Protocol or possible withdrawal symptoms   Leata Mouse, MD 12/04/2016, 12:59 PM

## 2016-12-05 MED ORDER — OLANZAPINE 5 MG PO TBDP
ORAL_TABLET | ORAL | Status: AC
  Filled 2016-12-05: qty 1

## 2016-12-05 MED ORDER — QUETIAPINE FUMARATE 25 MG PO TABS
25.0000 mg | ORAL_TABLET | Freq: Three times a day (TID) | ORAL | Status: DC | PRN
  Administered 2016-12-05 – 2016-12-06 (×2): 25 mg via ORAL
  Filled 2016-12-05 (×2): qty 1

## 2016-12-05 MED ORDER — OLANZAPINE 5 MG PO TBDP
5.0000 mg | ORAL_TABLET | Freq: Three times a day (TID) | ORAL | Status: DC | PRN
  Administered 2016-12-05 – 2016-12-06 (×2): 5 mg via ORAL
  Filled 2016-12-05 (×2): qty 1

## 2016-12-05 MED ORDER — OLANZAPINE 5 MG PO TBDP
5.0000 mg | ORAL_TABLET | ORAL | Status: DC | PRN
  Administered 2016-12-05: 5 mg via ORAL

## 2016-12-05 NOTE — [facility] Group Notes (Signed)
[facility] LCSW Group Therapy Note  Date/Time:  12/05/2016 1:15-2:15PM  Type of Therapy and Topic:  Group Therapy:  Healthy and Unhealthy Supports  Participation Level:  Did Not Attend   Description of Group:  Patients in this group were introduced to the idea of adding a variety of healthy supports to address the various needs in their lives. The picture on the front of Sunday's workbook was used to demonstrate why more supports are needed in every patient's life.  Patients identified and described healthy supports versus unhealthy supports in general, then gave examples of each in their own lives.   They discussed what additional healthy supports could be helpful in their recovery and wellness after discharge in order to prevent future hospitalizations.   An emphasis was placed on using counselor, doctor, therapy groups, 12-step groups, and problem-specific support groups to expand supports.  They also worked as a group on developing a specific plan for several patients to deal with unhealthy supports through boundary-setting, psychoeducation with loved ones, and even termination of relationships.   Therapeutic Goals:   1)  discuss importance of adding supports to stay well once out of the hospital  2)  compare healthy versus unhealthy supports and identify some examples of each  3)  generate ideas and descriptions of healthy supports that can be added  4)  offer mutual support about how to address unhealthy supports  5)  encourage active participation in and adherence to discharge plan    Summary of Patient Progress:  N/A   Therapeutic Modalities:   Motivational Interviewing Brief Solution-Focused Therapy  Ervey Fallin Grossman-Orr, LCSW 12/05/2016, 2:15PM 

## 2016-12-05 NOTE — Progress Notes (Signed)
D Patient started the day off agitated , threatening and cussing staff " who f------ said that? These people don't give a s---.Marland Kitchen.All they  care about is themselves". Patient continued cussing, threatening staff, accusing staff of " not taking care of me..I'm going to report yall to the director".  Writer spoke 1:1 with patient with help of AC (TT), explained that per MD order, phone priveleges would be suspended for 24 hours ( until after 2p MOnday), due to patietn's aggressive, inappropriate behaviors. . Initially, pt was upset, but staff talked pt into taking prn zyprexa zydis tab ( at 1345) and then pt  Slept x about 3 hours. When she got up for dinner, she was  calmer, she did not yell and / or make any loud threats to anybody and she thanked staff for prn medication and apologized for her earlier behavior. A She completed her daily assessment and on this she wrote she has experienced SI today, she contracts with this nurse to not hurt herself and she rates her depression, hopelessness and anxeity " 11/24/08". R Safety is in place and poc cont.

## 2016-12-05 NOTE — Progress Notes (Signed)
Patient did attend the evening speaker AA meeting but left fifteen minutes before it was over and returned to her room.

## 2016-12-05 NOTE — Progress Notes (Signed)
Writer has observed patient up in the dayroom interacting with Samantha mostly. She came to speak to writer shortly after shift change to inform writher when she could get her prn of seroquel. She reports that she still feels that her medications are not right because she reports feeling restless, irritable and can't calm herself down and stop talking. Writer informed her that this information would be shared with her nurse on day shift. She was encouraged to use some of her coping skills from group to help with these times she feels this way. Safety maintained on unit with 15 min checks.

## 2016-12-05 NOTE — Progress Notes (Deleted)
[facility] MD Progress Note  12/05/2016 1:17 PM Kathy [name]  MRN:  [id number]   Subjective:  Patient reports " I need something for anxiety throughout the day." - Patient was recently started on Depakote.  Objective: Kathy [name] is awake, alert and oriented.  Patient present hypervariable and agitation. Reports she was prescribed Depakote in the past and knows that this well not help her anxiety.  Per staffing notes patient is irritable and anxious. Reports she is medication compliant. Reports she is used to taking xanax to help with her irritability. Discussed medication adjustment to Seroquel 25 mg  Q12 to Q8HS PRN.  Discussed medications adjustment with MD. Will make available Zyprexa for anxiety and agitation. Support, encouragement and reassurance was provided.   Principal Problem: Polysubstance dependence including opioid type drug, continuous use (HCC) Diagnosis:   Patient Active Problem List   Diagnosis Date Noted  . Polysubstance (excluding opioids) dependence, daily use (HCC) [F19.20] 11/30/2016  . Polysubstance dependence including opioid type drug, continuous use (HCC) [F11.20, F19.20] 11/30/2016  . Substance induced mood disorder (HCC) [F19.94] 03/24/2016  . History of 3 spontaneous abortions [Z87.42] 03/24/2016  . Eczema [L30.9] 03/24/2016  . Depression [F32.9] 03/24/2016  . Asthma without status asthmaticus without complication [J45.909] 03/24/2016  . Anxiety [F41.9] 03/24/2016  . Opiate abuse, continuous (HCC) [F11.10] 03/24/2016  . Fibromyalgia [M79.7] 04/17/2015  . Chronic hepatitis C virus infection (HCC) [B18.2] 02/28/2015  . Chronic migraine [G43.709] 07/23/2014  . Bipolar disorder (HCC) [F31.9] 05/10/2013  . PTSD (post-traumatic stress disorder) [F43.10] 05/10/2013  . Substance abuse (HCC) [F19.10] 10/03/2012   Total Time spent with patient: 25 minutes  Past Psychiatric History: See H&P  Past Medical History:  Past Medical History:  Diagnosis Date  . Anxiety    . Bipolar disorder (HCC)   . Compression fracture of spine (HCC)   . Fibromyalgia   . Headache   . PTSD (post-traumatic stress disorder)   . Seizures (HCC)    Related to benzo withdrawal    Past Surgical History:  Procedure Laterality Date  . HAND SURGERY Right    Family History: History reviewed. No pertinent family history. Family Psychiatric  History: See H&P Social History:  History  Alcohol Use  . Yes    Comment: Daily. Last drink: PTA  from [facility], Drinks beer and liquor.      History  Drug Use  . Types: Cocaine, Benzodiazepines, Heroin, Hydrocodone    Comment: Cocaine: last used; Heroin: last used 09/17/2016;     Social History   Social History  . Marital status: Single    Spouse name: N/A  . Number of children: N/A  . Years of education: N/A   Social History Main Topics  . Smoking status: Current Every Day Smoker    Packs/day: 1.00    Types: Cigarettes  . Smokeless tobacco: Never Used     Comment: Pt does not want to stop smoking at this time  . Alcohol use Yes     Comment: Daily. Last drink: PTA  from [facility], Drinks beer and liquor.   . Drug use: Yes    Types: Cocaine, Benzodiazepines, Heroin, Hydrocodone     Comment: Cocaine: last used; Heroin: last used 09/17/2016;   . Sexual activity: Yes    Birth control/ protection: Injection     Comment: Depo Provera   Other Topics Concern  . None   Social History Narrative  . None   Additional Social History:  Sleep: Good  Appetite:  Good  Current Medications: Current Facility-Administered Medications  Medication Dose Route Frequency Provider Last Rate Last Dose  . acetaminophen (TYLENOL) tablet 650 mg  650 mg Oral Q6H PRN Kerry Hough, PA-C   650 mg at 12/05/16 0801  . albuterol (PROVENTIL HFA;VENTOLIN HFA) 108 (90 Base) MCG/ACT inhaler 2 puff  2 puff Inhalation Q6H PRN Leata Mouse, MD   2 puff at 12/05/16 0740  . alum & mag hydroxide-simeth  (MAALOX/MYLANTA) 200-200-20 MG/5ML suspension 30 mL  30 mL Oral Q4H PRN Donell Sievert E, PA-C      . benztropine (COGENTIN) tablet 0.5 mg  0.5 mg Oral BID Leata Mouse, MD   0.5 mg at 12/05/16 0741  . divalproex (DEPAKOTE) DR tablet 500 mg  500 mg Oral Q12H Leata Mouse, MD   500 mg at 12/05/16 0741  . gabapentin (NEURONTIN) capsule 600 mg  600 mg Oral [id number] Kerry Hough, PA-C   600 mg at 12/05/16 0740  . hydrOXYzine (ATARAX/VISTARIL) tablet 50 mg  50 mg Oral TID PRN Money, Gerlene Burdock, FNP   50 mg at 12/05/16 0801  . magnesium hydroxide (MILK OF MAGNESIA) suspension 30 mL  30 mL Oral Daily PRN Kerry Hough, PA-C      . multivitamin with minerals tablet 1 tablet  1 tablet Oral Daily Kerry Hough, PA-C   1 tablet at 12/05/16 0740  . nicotine polacrilex (NICORETTE) gum 2 mg  2 mg Oral PRN Armandina Stammer I, NP   2 mg at 12/05/16 0746  . prazosin (MINIPRESS) capsule 2 mg  2 mg Oral QHS Cobos, Rockey Situ, MD   2 mg at 12/04/16 2123  . QUEtiapine (SEROQUEL) tablet 100 mg  100 mg Oral Daily Money, Feliz Beam B, FNP   100 mg at 12/05/16 0800   And  . QUEtiapine (SEROQUEL) tablet 300 mg  300 mg Oral QHS Money, Gerlene Burdock, FNP   300 mg at 12/04/16 2123  . QUEtiapine (SEROQUEL) tablet 25 mg  25 mg Oral Q8H PRN Oneta Rack, NP      . thiamine (B-1) injection 100 mg  100 mg Intramuscular Once Donell Sievert E, PA-C      . thiamine (VITAMIN B-1) tablet 100 mg  100 mg Oral Daily Donell Sievert E, PA-C   100 mg at 12/05/16 0740    Lab Results: No results found for this or any previous visit (from the past 48 hour(s)).  Blood Alcohol level:  Lab Results  Component Value Date   ETH <10 11/29/2016   ETH <5 03/23/2016    Metabolic Disorder Labs: Lab Results  Component Value Date   HGBA1C 5.2 03/24/2016   MPG 103 03/24/2016   Lab Results  Component Value Date   PROLACTIN 54.1 (H) 03/24/2016   Lab Results  Component Value Date   CHOL 164 03/24/2016   TRIG 99  03/24/2016   HDL 47 03/24/2016   CHOLHDL 3.5 03/24/2016   VLDL 20 03/24/2016   LDLCALC 97 03/24/2016    Physical Findings: AIMS: Facial and Oral Movements Muscles of Facial Expression: None, normal Lips and Perioral Area: None, normal Jaw: None, normal Tongue: None, normal,Extremity Movements Upper (arms, wrists, hands, fingers): None, normal Lower (legs, knees, ankles, toes): None, normal, Trunk Movements Neck, shoulders, hips: None, normal, Overall Severity Severity of abnormal movements (highest score from questions above): None, normal Incapacitation due to abnormal movements: None, normal Patient's awareness of abnormal movements (rate only patient's report): No Awareness, Dental  Status Current problems with teeth and/or dentures?: No Does patient usually wear dentures?: No  CIWA:  CIWA-Ar Total: 3 COWS:  COWS Total Score: 4  Musculoskeletal: Strength & Muscle Tone: within normal limits Gait & Station: normal Patient leans: N/A  Psychiatric Specialty Exam: Physical Exam  Nursing note and vitals reviewed. Constitutional: She is oriented to person, place, and time. She appears well-developed and well-nourished.  Respiratory: Effort normal.  Musculoskeletal: Normal range of motion.  Neurological: She is alert and oriented to person, place, and time.  Skin: Skin is warm.    Review of Systems  Constitutional: Negative.   HENT: Negative.   Eyes: Negative.   Respiratory: Negative.   Cardiovascular: Negative.   Gastrointestinal: Negative.   Genitourinary: Negative.   Musculoskeletal: Negative.   Skin: Negative.   Neurological: Negative.   Endo/Heme/Allergies: Negative.   Psychiatric/Behavioral: Negative for depression, hallucinations and suicidal ideas. The patient is nervous/anxious.     Blood pressure 106/77, pulse (!) 115, temperature 98.1 F (36.7 C), temperature source Oral, resp. rate 16, height 5\' 4"  (1.626 m), weight 74.8 kg (165 lb), SpO2 96 %.Body mass  index is 28.32 kg/m.  General Appearance: Casual  Eye Contact:  Good  Speech:  Clear and Coherent and Normal Rate  Volume:  Normal  Mood:  Anxious  Affect:  Congruent  Thought Process:  Goal Directed and Descriptions of Associations: Intact  Orientation:  Full (Time, Place, and Person)  Thought Content:  WDL  Suicidal Thoughts:  No  Homicidal Thoughts:  No  Memory:  Immediate;   Good Recent;   Good Remote;   Good  Judgement:  Good  Insight:  Good  Psychomotor Activity:  Normal  Concentration:  Concentration: Good and Attention Span: Good  Recall:  Good  Fund of Knowledge:  Good  Language:  Good  Akathisia:  No  Handed:  Right  AIMS (if indicated):     Assets:  Communication Skills Desire for Improvement Financial Resources/Insurance Housing Physical Health Social Support Transportation  ADL's:  Intact  Cognition:  WNL  Sleep:  Number of Hours: 5.75     I agree with current treatment plan on 12/05/2016, Patient seen face-to-face for psychiatric evaluation follow-up, chart reviewed and case discussed. Chart discussed with MD Afreen. Reviewed the information documented and agree with the treatment plan.   Treatment Plan Summary: Daily contact with patient to assess and evaluate symptoms and progress in treatment and Medication management   -Change Seroquel to 100 mg PO Daily and 300 mg PO QHS for mood stability -Adjusted  Seroquel 25 mg PO PRN to Q 8H for agitation/psychosis -Zyprexa 5 mg PRN for anxiety and agitation -Continue Prazosin 2 mg PO QHS for PTSD -Continue Neurontin 600 mg PO TID for withdrawal symptoms -Increase Vistaril 50 mg PO TID PRN for anxiety  -Encourage group therapy participation  -Continue Prozac 10 mg PO Daily for mood stability Continue Librium Protocol   Oneta Rackanika N Aynsley Fleet, NP 12/05/2016, 1:17 PM

## 2016-12-06 MED ORDER — QUETIAPINE FUMARATE 100 MG PO TABS: 300.0000 mg | ORAL_TABLET | Freq: Every day | ORAL | 0 refills | Status: DC

## 2016-12-06 MED ORDER — GABAPENTIN 600 MG PO TABS
600.0000 mg | ORAL_TABLET | ORAL | Status: DC
  Filled 2016-12-06 (×3): qty 63

## 2016-12-06 MED ORDER — DIVALPROEX SODIUM 500 MG PO DR TAB: 500.0000 mg | DELAYED_RELEASE_TABLET | Freq: Two times a day (BID) | ORAL | 0 refills | Status: DC

## 2016-12-06 MED ORDER — NICOTINE POLACRILEX 2 MG MT GUM: 2.0000 mg | CHEWING_GUM | OROMUCOSAL | 0 refills | Status: DC | PRN

## 2016-12-06 MED ORDER — PRAZOSIN HCL 2 MG PO CAPS: 2.0000 mg | ORAL_CAPSULE | Freq: Every day | ORAL | 0 refills | Status: DC

## 2016-12-06 MED ORDER — BENZTROPINE MESYLATE 0.5 MG PO TABS: 0.5000 mg | ORAL_TABLET | Freq: Two times a day (BID) | ORAL | 0 refills | Status: DC

## 2016-12-06 MED ORDER — GABAPENTIN 300 MG PO CAPS: 600.0000 mg | ORAL_CAPSULE | ORAL | 0 refills | Status: DC

## 2016-12-06 NOTE — Progress Notes (Signed)
Patient ID: Kathy [name], female   DOB: [birth year], 31 y.o.   MRN: [id number] Patient educated on all of her discharge instructions and verbalized understanding.  Pt left hospital with all of her discharge instructions, prescription scripts and personal belongings.  [facility].  Pt's medical samples, prescription scripts and discharge paper work given to [facility] staff.

## 2016-12-06 NOTE — Progress Notes (Signed)
Writer has spent a good part of my time medicating Kathy [name] tonight. She constantly came and requested prns reporting that she needs her second dose of zyprexa because its time or wanting visteril and then goes in Kathy dayroom and talks loudly to where other are not able to focus on tv programs. Writer had to ask her to give Kathy [name] and Kathy [name] some privacy because whenever writer would medicate Kathy [name] she would come to medication window and stand by her or behind her. She eventually went to bed when Kathy dayroom closed for Kathy night. She has been very needy and requesting as many prns as possible. Safety maintained on unit with 15 checks.

## 2016-12-06 NOTE — Tx Team (Signed)
Interdisciplinary Treatment and Diagnostic Plan Update  12/06/2016 Time of Session: 0830AM Kathy [name] MRN: [id number]  Principal Diagnosis: Polysubstance dependence including opioid type drug, continuous use (HCC)  Secondary Diagnoses: Principal Problem:   Polysubstance dependence including opioid type drug, continuous use (HCC) Active Problems:   Substance induced mood disorder (HCC)   Anxiety   Current Medications:  Current Facility-Administered Medications  Medication Dose Route Frequency Provider Last Rate Last Dose  . acetaminophen (TYLENOL) tablet 650 mg  650 mg Oral Q6H PRN Kerry Hough, PA-C   650 mg at 12/06/16 4098  . albuterol (PROVENTIL HFA;VENTOLIN HFA) 108 (90 Base) MCG/ACT inhaler 2 puff  2 puff Inhalation Q6H PRN Leata Mouse, MD   2 puff at 12/05/16 2223  . alum & mag hydroxide-simeth (MAALOX/MYLANTA) 200-200-20 MG/5ML suspension 30 mL  30 mL Oral Q4H PRN Donell Sievert E, PA-C      . benztropine (COGENTIN) tablet 0.5 mg  0.5 mg Oral BID Leata Mouse, MD   0.5 mg at 12/06/16 0737  . divalproex (DEPAKOTE) DR tablet 500 mg  500 mg Oral Q12H Leata Mouse, MD   500 mg at 12/06/16 0737  . gabapentin (NEURONTIN) capsule 600 mg  600 mg Oral [id number] Kerry Hough, PA-C   600 mg at 12/06/16 1191  . hydrOXYzine (ATARAX/VISTARIL) tablet 50 mg  50 mg Oral TID PRN Money, Gerlene Burdock, FNP   50 mg at 12/05/16 2223  . magnesium hydroxide (MILK OF MAGNESIA) suspension 30 mL  30 mL Oral Daily PRN Kerry Hough, PA-C      . multivitamin with minerals tablet 1 tablet  1 tablet Oral Daily Kerry Hough, PA-C   1 tablet at 12/06/16 4782  . nicotine polacrilex (NICORETTE) gum 2 mg  2 mg Oral PRN Armandina Stammer I, NP   2 mg at 12/06/16 0907  . OLANZapine zydis (ZYPREXA) disintegrating tablet 5 mg  5 mg Oral TID PRN Oneta Rack, NP   5 mg at 12/06/16 9562  . prazosin (MINIPRESS) capsule 2 mg  2 mg Oral QHS Cobos, Rockey Situ, MD   2 mg at  12/05/16 2122  . QUEtiapine (SEROQUEL) tablet 100 mg  100 mg Oral Daily Money, Gerlene Burdock, FNP   100 mg at 12/06/16 1308   And  . QUEtiapine (SEROQUEL) tablet 300 mg  300 mg Oral QHS Money, Gerlene Burdock, FNP   300 mg at 12/05/16 2122  . QUEtiapine (SEROQUEL) tablet 25 mg  25 mg Oral Q8H PRN Oneta Rack, NP   25 mg at 12/06/16 0907  . thiamine (B-1) injection 100 mg  100 mg Intramuscular Once Donell Sievert E, PA-C      . thiamine (VITAMIN B-1) tablet 100 mg  100 mg Oral Daily Kerry Hough, PA-C   100 mg at 12/06/16 6578   PTA Medications: Prescriptions Prior to Admission  Medication Sig Dispense Refill Last Dose  . clonazePAM (KLONOPIN) 0.5 MG tablet Take 0.5 mg by mouth 2 (two) times daily.   11/29/2016 at Unknown time  . cyclobenzaprine (FLEXERIL) 10 MG tablet Take 10 mg by mouth 2 (two) times daily.   11/29/2016 at Unknown time  . FLUoxetine (PROZAC) 40 MG capsule Take 1 capsule by mouth daily.   11/29/2016 at Unknown time  . gabapentin (NEURONTIN) 300 MG capsule Take 2 capsules (600 mg total) by mouth 3 (three) times daily at 8am, 3pm and bedtime. For agitation 180 capsule 0 11/29/2016 at Unknown time  . QUEtiapine (SEROQUEL) 50  MG tablet Take 50-100 mg by mouth 2 (two) times daily. 50 Mg in morning and 100 Mg at bedtime   11/29/2016 at Unknown time    Patient Stressors: Financial difficulties Legal issue Marital or family conflict Substance abuse  Patient Strengths: Physical Health Special hobby/interest Supportive family/friends  Treatment Modalities: Medication Management, Group therapy, Case management,  1 to 1 session with clinician, Psychoeducation, Recreational therapy.   Physician Treatment Plan for Primary Diagnosis: Polysubstance dependence including opioid type drug, continuous use (HCC) Long Term Goal(s): Improvement in symptoms so as ready for discharge Improvement in symptoms so as ready for discharge   Short Term Goals: Ability to identify changes in lifestyle  to reduce recurrence of condition will improve Ability to verbalize feelings will improve Ability to demonstrate self-control will improve Ability to identify and develop effective coping behaviors will improve Compliance with prescribed medications will improve Ability to identify triggers associated with substance abuse/mental health issues will improve  Medication Management: Evaluate patient's response, side effects, and tolerance of medication regimen.  Therapeutic Interventions: 1 to 1 sessions, Unit Group sessions and Medication administration.  Evaluation of Outcomes: Adequate for discharge   Physician Treatment Plan for Secondary Diagnosis: Principal Problem:   Polysubstance dependence including opioid type drug, continuous use (HCC) Active Problems:   Substance induced mood disorder (HCC)   Anxiety  Long Term Goal(s): Improvement in symptoms so as ready for discharge Improvement in symptoms so as ready for discharge   Short Term Goals: Ability to identify changes in lifestyle to reduce recurrence of condition will improve Ability to verbalize feelings will improve Ability to demonstrate self-control will improve Ability to identify and develop effective coping behaviors will improve Compliance with prescribed medications will improve Ability to identify triggers associated with substance abuse/mental health issues will improve     Medication Management: Evaluate patient's response, side effects, and tolerance of medication regimen.  Therapeutic Interventions: 1 to 1 sessions, Unit Group sessions and Medication administration.  Evaluation of Outcomes: Adequate for discharge    RN Treatment Plan for Primary Diagnosis: Polysubstance dependence including opioid type drug, continuous use (HCC) Long Term Goal(s): Knowledge of disease and therapeutic regimen to maintain health will improve  Short Term Goals: Ability to remain free from injury will improve, Ability to  verbalize feelings will improve and Ability to disclose and discuss suicidal ideas  Medication Management: RN will administer medications as ordered by provider, will assess and evaluate patient's response and provide education to patient for prescribed medication. RN will report any adverse and/or side effects to prescribing provider.  Therapeutic Interventions: 1 on 1 counseling sessions, Psychoeducation, Medication administration, Evaluate responses to treatment, Monitor vital signs and CBGs as ordered, Perform/monitor CIWA, COWS, AIMS and Fall Risk screenings as ordered, Perform wound care treatments as ordered.  Evaluation of Outcomes: Adequate for discharge    LCSW Treatment Plan for Primary Diagnosis: Polysubstance dependence including opioid type drug, continuous use (HCC) Long Term Goal(s): Safe transition to appropriate next level of care at discharge, Engage patient in therapeutic group addressing interpersonal concerns.  Short Term Goals: Engage patient in aftercare planning with referrals and resources, Facilitate patient progression through stages of change regarding substance use diagnoses and concerns and Identify triggers associated with mental health/substance abuse issues  Therapeutic Interventions: Assess for all discharge needs, 1 to 1 time with Social worker, Explore available resources and support systems, Assess for adequacy in community support network, Educate family and significant other(s) on suicide prevention, Complete Psychosocial Assessment, Interpersonal group therapy.  Evaluation of Outcomes: Adequate for discharge    Progress in Treatment: Attending groups: Yes.   Participating in groups: Yes. Taking medication as prescribed: Yes. Toleration medication: Yes. Family/Significant other contact made: Yes, individual(s) contacted:  pt's mother Patient understands diagnosis: Yes. Discussing patient identified problems/goals with staff: Yes. Medical problems  stabilized or resolved: Yes. Denies suicidal/homicidal ideation: Yes. Issues/concerns per patient self-inventory: No. Other: n/a   New problem(s) identified: No. N/a   New Short Term/Long Term Goal(s): medication management for mood stabilization/detox, elimination of SI thoughts; development of comprehensive mental wellness/sobriety plan.   Discharge Plan or Barriers: [facility]   Reason for Continuation of Hospitalization: none  Estimated Length of Stay: Monday 12/06/16  Attendees: Patient: 12/06/2016 10:03 AM  Physician: Dr. Altamese Carolinaainville MD; Dr. Jama Flavorsobos MD 12/06/2016 10:03 AM  Nursing: Jesusita Okaan RN 12/06/2016 10:03 AM  RN Care Manager: Onnie BoerJennifer clark CM 12/06/2016 10:03 AM  Social Worker: Chartered loss adjusterHeather Smart, LCSW 12/06/2016 10:03 AM  Recreational Therapist: x 12/06/2016 10:03 AM  Other: Hillery Jacksanika Lewis NP; Armandina Stammergnes Nwoko NP 12/06/2016 10:03 AM  Other:  12/06/2016 10:03 AM  Other: 12/06/2016 10:03 AM    Scribe for Treatment Team: Ledell PeoplesHeather N Smart, LCSW 12/06/2016 10:03 AM

## 2016-12-06 NOTE — Progress Notes (Signed)
Recreation Therapy Notes  Date:  12/06/16 Time: 0930 Location: [address] Dayroom  Group Topic: Stress Management  Goal Area(s) Addresses:  Patient will verbalize importance of using healthy stress management.  Patient will identify positive emotions associated with healthy stress management.   Behavioral Response: Engaged  Intervention: Stress Management  Activity :  Meditation.  LRT introduced the stress management technique of meditation.  LRT played a meditation leading patients through a body scan that allowed them to take note of the sensations they may be feeling.  Patients were to follow along as meditation played.  Education:  Stress Management, Discharge Planning.   Education Outcome: Acknowledges edcuation/In group clarification offered/Needs additional education  Clinical Observations/Feedback: Pt attended group.   Caroll RancherMarjette Khiree Bukhari, LRT/CTRS         Lillia AbedLindsay, Raziah Funnell A 12/06/2016 10:57 AM

## 2016-12-06 NOTE — BHH Suicide Risk Assessment (Addendum)
[facility]   Principal Problem: Polysubstance dependence including opioid type drug, continuous use [facility](HCC) Discharge Diagnoses:  Patient Active Problem List   Diagnosis Date Noted  . Polysubstance (excluding opioids) dependence, daily use (HCC) [F19.20] 11/30/2016  . Polysubstance dependence including opioid type drug, continuous use (HCC) [F11.20, F19.20] 11/30/2016  . Substance induced mood disorder (HCC) [F19.94] 03/24/2016  . History of 3 spontaneous abortions [Z87.42] 03/24/2016  . Eczema [L30.9] 03/24/2016  . Depression [F32.9] 03/24/2016  . Asthma without status asthmaticus without complication [J45.909] 03/24/2016  . Anxiety [F41.9] 03/24/2016  . Opiate abuse, continuous (HCC) [F11.10] 03/24/2016  . Fibromyalgia [M79.7] 04/17/2015  . Chronic hepatitis C virus infection (HCC) [B18.2] 02/28/2015  . Chronic migraine [G43.709] 07/23/2014  . Bipolar disorder (HCC) [F31.9] 05/10/2013  . PTSD (post-traumatic stress disorder) [F43.10] 05/10/2013  . Substance abuse (HCC) [F19.10] 10/03/2012    Total Time spent with patient: 30 minutes   Musculoskeletal: Strength & Muscle Tone: within normal limits Gait & Station: normal Patient leans: N/A  Psychiatric Specialty Exam: ROS mild headache, no chest pain, no shortness of breath, no vomiting   Blood pressure 128/78, pulse (!) 117, temperature 97.8 F (36.6 C), temperature source Oral, resp. rate 18, height 5\' 4"  (1.626 m), weight 74.8 kg (165 lb), SpO2 96 %.Body mass index is 28.32 kg/m.  General Appearance: Well Groomed  Eye Contact::  Good  Speech:  Normal Rate409  Volume:  Normal  Mood:  improved , states she feels better   Affect:  Appropriate and states she feels calmer. Not irritable at this time  Thought Process:  Linear and Descriptions of Associations: Intact  Orientation:  Other:  fully alert and attentive   Thought Content:  denies hallucinations, no delusions, not internally preoccupied    Suicidal Thoughts:  No denies suicidal or self injurious ideations, denies any homicidal or violent ideations   Homicidal Thoughts:  No  Memory:  recent and remote grossly intact   Judgement:  Other:  improving   Insight:  improving   Psychomotor Activity:  Normal  Concentration:  Good  Recall:  Good  Fund of Knowledge:Good  Language: Good  Akathisia:  Negative  Handed:  Right  AIMS (if indicated):     Assets:  Desire for Improvement Resilience  Sleep:  Number of Hours: 6.5  Cognition: WNL  ADL's:  Intact   Mental Status Per Nursing Assessment::   On Admission:     Demographic Factors:  31 year old female, has three children who are with patient's mother  Loss Factors: Father of her children facing serious legal issues, does not have her children at this time, upcoming court dates, history of relapse   Historical Factors: History of polysubstance dependence, identifies opiates as substance of choice , has been diagnosed with Bipolar Disorder in the past   Risk Reduction Factors:   Responsible for children under 32 years of age, Sense of responsibility to family, Living with another person, especially a relative and Positive coping skills or problem solving skills  Continued Clinical Symptoms:  At this time patient is alert , attentive, improved compared to admission. Mood is improved, states " I feel more level", affect is appropriate, not currently irritable, no thought disorder, no suicidal or self injurious ideations, no homicidal ideations, no psychotic symptoms.  Denies medication side effects- we discussed medication side effects - including risk of weight gain and metabolic disturbances on Seroquel, and teratogenic effects of Depakote.  Cognitive Features That Contribute To Risk:  No gross cognitive deficits noted upon discharge. Is alert , attentive, and oriented x 3   Suicide Risk:  Mild:  Suicidal ideation of limited frequency, intensity, duration, and  specificity.  There are no identifiable plans, no associated intent, mild dysphoria and related symptoms, good self-control (both objective and subjective assessment), few other risk factors, and identifiable protective factors, including available and accessible social support.  Follow-up Information    [facility] Follow up.   Why:  message left for admissions director Delene Ruffini.  Contact information: [address] [place], [place] [address] Phone: [phone] Fax:           Plan Of Care/Follow-up recommendations:  Activity:  as tolerated  Diet:  regular Tests:  NA Other:  see below   Patient is leaving in good spirits, going to rehab program ( [facility])    Craige Cotta, MD 12/06/2016, 9:55 AM

## 2016-12-06 NOTE — Progress Notes (Signed)
  [facility] :  Will you be returning to the same living situation after discharge:  No. [facility]  At discharge, do you have transportation home?: Yes,  [facility] driver will pick her up at 11am Do you have the ability to pay for your medications: Yes,  medication management  Release of information consent forms completed and submitted to medical records by CSW.  Patient to Follow up at: Follow-up Information    [facility] Follow up on 12/06/2016.   Specialty:  Behavioral Health Why:  [facility] for [facility]. Please make sure that you have been provided with at 21 day supply of your medications. Thank you.  Contact information: [address] [place] [phone]          Next level of care provider has access to [facility] Link:no  Safety Planning and Suicide Prevention discussed: Yes,  SPE completed with pt's mother. SPI pamphlet and Mobile Crisis information provided.     Has patient been referred to the Quitline?: Patient refused referral  Patient has been referred for addiction treatment: Yes  Pulte HomesHeather N Smart, LCSW 12/06/2016, 10:07 AM

## 2016-12-06 NOTE — Discharge Instructions (Addendum)
Pt verbalizes understanding of discharge instructions.

## 2016-12-06 NOTE — Discharge Summary (Signed)
Physician Discharge Summary Note  Patient:  Kathy [name] is an 31 y.o., female MRN:  [id number] DOB:  [birth year] Patient phone:  [phone] (home)  Patient address:   [address] [place] [address],  Total Time spent with patient: 30 minutes  Date of Admission:  11/30/2016 Date of Discharge: 12/06/2016  Reason for Admission: Per HPI- This is an admission assessment for this 31 year old Caucasian female with hx of polysubstance use disorder, mental illness & PTSD. She was a patient in this [facility] last February, received detoxification & mood stabilization treatments. Discharged to follow-up on an outpatient basis. She is being re-admitted to the [facility] this time as a walk-in with complaints of worsening depression & increased drug use. She was medically cleared at the [facility].  During this assessment, Kathy [name] reports, "I'm a walk-in, was [facility] for medical clearance. I came here to get my mental health medications right. My mind is racing non-stop everyday. I can't fall asleep or maintain sleep at night. I cannot complete a simple task. I have problems finishing my thoughts or completing a sentence. I'm unable to concentrate. My anxiety is bad. I just got out of jail 10 days ago on 11-20-16 for Felony charge of conspiracy to sell & deliver a controlled substance. I'm homeless since being out of jail. I'm unemployed. I did not know how to cope because I have not been on my medicines. I was on Neurontin, Seroquel, Flexeril, Klonopin, Prozac & minipress. The Seroquel does not help me. I recently relapsed on drugs. I did one line of Cocaine".  Principal Problem: Polysubstance dependence including opioid type drug, continuous use [facility]) Discharge Diagnoses: Patient Active Problem List   Diagnosis Date Noted  . Polysubstance (excluding opioids) dependence, daily use (HCC) [F19.20] 11/30/2016  . Polysubstance dependence including opioid type drug, continuous  use (HCC) [F11.20, F19.20] 11/30/2016  . Substance induced mood disorder (HCC) [F19.94] 03/24/2016  . History of 3 spontaneous abortions [Z87.42] 03/24/2016  . Eczema [L30.9] 03/24/2016  . Depression [F32.9] 03/24/2016  . Asthma without status asthmaticus without complication [J45.909] 03/24/2016  . Anxiety [F41.9] 03/24/2016  . Opiate abuse, continuous (HCC) [F11.10] 03/24/2016  . Fibromyalgia [M79.7] 04/17/2015  . Chronic hepatitis C virus infection (HCC) [B18.2] 02/28/2015  . Chronic migraine [G43.709] 07/23/2014  . Bipolar disorder (HCC) [F31.9] 05/10/2013  . PTSD (post-traumatic stress disorder) [F43.10] 05/10/2013  . Substance abuse [facility]) [F19.10] 10/03/2012    Past Psychiatric History:   Past Medical History:  Past Medical History:  Diagnosis Date  . Anxiety   . Bipolar disorder (HCC)   . Compression fracture of spine (HCC)   . Fibromyalgia   . Headache   . PTSD (post-traumatic stress disorder)   . Seizures (HCC)    Related to benzo withdrawal    Past Surgical History:  Procedure Laterality Date  . HAND SURGERY Right    Family History: History reviewed. No pertinent family history. Family Psychiatric  History:  Social History:  History  Alcohol Use  . Yes    Comment: Daily. Last drink: PTA  from [facility], Drinks beer and liquor.      History  Drug Use  . Types: Cocaine, Benzodiazepines, Heroin, Hydrocodone    Comment: Cocaine: last used; Heroin: last used 09/17/2016;     Social History   Social History  . Marital status: Single    Spouse name: N/A  . Number of children: N/A  . Years of education: N/A  Social History Main Topics  . Smoking status: Current Every Day Smoker    Packs/day: 1.00    Types: Cigarettes  . Smokeless tobacco: Never Used     Comment: Pt does not want to stop smoking at this time  . Alcohol use Yes     Comment: Daily. Last drink: PTA  from [facility], Drinks beer and liquor.   . Drug use: Yes    Types: Cocaine, Benzodiazepines, Heroin,  Hydrocodone     Comment: Cocaine: last used; Heroin: last used 09/17/2016;   . Sexual activity: Yes    Birth control/ protection: Injection     Comment: Depo Provera   Other Topics Concern  . None   Social History Narrative  . None    Hospital Course:  Kathy [name] was admitted for Polysubstance dependence including opioid type drug, continuous use (HCC) and crisis management.  Pt was treated discharged with the medications listed below under Medication List.  Medical problems were identified and treated as needed.  Home medications were restarted as appropriate.  Improvement was monitored by observation and Kathy [name] 's daily report of symptom reduction.  Emotional and mental status was monitored by daily self-inventory reports completed by Kathy [name] and clinical staff.         Kathy [name] was evaluated by the treatment team for stability and plans for continued recovery upon discharge. Kathy [name] 's motivation was an integral factor for scheduling further treatment. Employment, transportation, bed availability, health status, family support, and any pending legal issues were also considered during hospital stay. Pt was offered further treatment options upon discharge including but not limited to Residential, Intensive Outpatient, and Outpatient treatment.  Kathy [name] will follow up with the services as listed below under Follow Up Information.     Upon completion of this admission the patient was both mentally and medically stable for discharge denying suicidal/homicidal ideation, auditory/visual/tactile hallucinations, delusional thoughts and paranoia.    Kathy [name] responded well to treatment with Cogentin 0.5mg , Neurontin 600 mg  and Seroquel 300 mg PO Q HS and 100 mg daily without adverse effects. Pt demonstrated improvement without reported or observed adverse effects to the point of stability appropriate for outpatient management. Pertinent labs include:  for which  outpatient follow-up is necessary for lab recheck as mentioned below. Reviewed CBC, CMP, BAL, and UDS + Concain, Benzodiazepine; all unremarkable aside from noted exceptions.   Physical Findings: AIMS: Facial and Oral Movements Muscles of Facial Expression: None, normal Lips and Perioral Area: None, normal Jaw: None, normal Tongue: None, normal,Extremity Movements Upper (arms, wrists, hands, fingers): None, normal Lower (legs, knees, ankles, toes): None, normal, Trunk Movements Neck, shoulders, hips: None, normal, Overall Severity Severity of abnormal movements (highest score from questions above): None, normal Incapacitation due to abnormal movements: None, normal Patient's awareness of abnormal movements (rate only patient's report): No Awareness, Dental Status Current problems with teeth and/or dentures?: No Does patient usually wear dentures?: No  CIWA:  CIWA-Ar Total: 2 COWS:  COWS Total Score: 4  Musculoskeletal: Strength & Muscle Tone: within normal limits Gait & Station: normal Patient leans: N/A  Psychiatric Specialty Exam: See SRA by MD  Physical Exam  Vitals reviewed. Constitutional: She appears well-developed.  Cardiovascular: Normal rate.   Neurological: She is alert.  Psychiatric: She has a normal mood and affect. Her behavior is normal.    Review of Systems  Psychiatric/Behavioral: Negative for depression (stable) and substance abuse. Nervous/anxious: stable.     Blood pressure 128/78, pulse Marland Kitchen)  117, temperature 97.8 F (36.6 C), temperature source Oral, resp. rate 18, height 5\' 4"  (1.626 m), weight 74.8 kg (165 lb), SpO2 96 %.Body mass index is 28.32 kg/m.       Has this patient used any form of tobacco in the last 30 days? (Cigarettes, Smokeless Tobacco, Cigars, and/or Pipes) Yes, Yes, A prescription for an FDA-approved tobacco cessation medication was offered at discharge and the patient refused  Blood Alcohol level:  Lab Results  Component Value Date    [facility] <10 11/29/2016   ETH <5 03/23/2016    Metabolic Disorder Labs:  Lab Results  Component Value Date   HGBA1C 5.2 03/24/2016   MPG 103 03/24/2016   Lab Results  Component Value Date   PROLACTIN 54.1 (H) 03/24/2016   Lab Results  Component Value Date   CHOL 164 03/24/2016   TRIG 99 03/24/2016   HDL 47 03/24/2016   CHOLHDL 3.5 03/24/2016   VLDL 20 03/24/2016   LDLCALC 97 03/24/2016    See Psychiatric Specialty Exam and Suicide Risk Assessment completed by Attending Physician prior to discharge.  Discharge destination:  [facility]  Is patient on multiple antipsychotic therapies at discharge:  No   Has Patient had three or more failed trials of antipsychotic monotherapy by history:  No  Recommended Plan for Multiple Antipsychotic Therapies: NA  Discharge Instructions    Diet - low sodium heart healthy    Complete by:  As directed    Discharge instructions    Complete by:  As directed    Take all medications as prescribed. Keep all follow-up appointments as scheduled.  Do not consume alcohol or use illegal drugs while on prescription medications. Report any adverse effects from your medications to your primary care provider promptly.  In the event of recurrent symptoms or worsening symptoms, call 911, a crisis hotline, or go to the nearest emergency department for evaluation.   Increase activity slowly    Complete by:  As directed      Allergies as of 12/06/2016   No Known Allergies     Medication List    STOP taking these medications   clonazePAM 0.5 MG tablet Commonly known as:  KLONOPIN   cyclobenzaprine 10 MG tablet Commonly known as:  FLEXERIL   FLUoxetine 40 MG capsule Commonly known as:  PROZAC     TAKE these medications     Indication  benztropine 0.5 MG tablet Commonly known as:  COGENTIN Take 1 tablet (0.5 mg total) by mouth 2 (two) times daily.  Indication:  Extrapyramidal Reaction caused by Medications   divalproex 500 MG DR tablet Commonly  known as:  DEPAKOTE Take 1 tablet (500 mg total) by mouth every 12 (twelve) hours.  Indication:  mood stablization   gabapentin 300 MG capsule Commonly known as:  NEURONTIN Take 2 capsules (600 mg total) by mouth 3 (three) times daily at 8am, 3pm and bedtime. What changed:  additional instructions  Indication:  Agitation   nicotine polacrilex 2 MG gum Commonly known as:  NICORETTE Take 1 each (2 mg total) by mouth as needed for smoking cessation.  Indication:  Nicotine Addiction   prazosin 2 MG capsule Commonly known as:  MINIPRESS Take 1 capsule (2 mg total) by mouth at bedtime.  Indication:  High Blood Pressure Disorder, Nightmares   QUEtiapine 100 MG tablet Commonly known as:  SEROQUEL Take 3 tablets (300 mg total) by mouth at bedtime. Take 1 tablet (100mg  total) by mouth in the morning What changed:  medication strength  how much to take  when to take this  additional instructions  Indication:  mood stablization      Follow-up Information    [facility] Follow up.   Why:  message left for admissions director Delene RuffiniAngela Daniel.  Contact information: [address] [place], [place] [address] Phone: [phone] Fax:           Follow-up recommendations:  Activity:  as tolerated Diet:  heart healthy  Comments:  Take all medications as prescribed. Keep all follow-up appointments as scheduled.  Do not consume alcohol or use illegal drugs while on prescription medications. Report any adverse effects from your medications to your primary care provider promptly.  In the event of recurrent symptoms or worsening symptoms, call 911, a crisis hotline, or go to the nearest emergency department for evaluation.   Signed: Oneta Rackanika N Lewis, NP 12/06/2016, 8:47 AM   Patient seen, Suicide Assessment Completed.  Disposition Plan Reviewed

## 2017-03-30 ENCOUNTER — Ambulatory Visit (HOSPITAL_COMMUNITY)
Admission: RE | Admit: 2017-03-30 | Discharge: 2017-03-30 | Disposition: A | Discharge status: Home / Self Care | Payer: Medicaid Other | Attending: Psychiatry | Admitting: Psychiatry

## 2017-03-30 ENCOUNTER — Emergency Department (HOSPITAL_COMMUNITY)
Admission: EM | Admit: 2017-03-30 | Discharge: 2017-03-31 | Disposition: A | Discharge status: Other Acute Inpatient Hospital | Payer: Self-pay | Attending: Emergency Medicine | Admitting: Emergency Medicine

## 2017-03-30 ENCOUNTER — Encounter (HOSPITAL_COMMUNITY): Payer: Self-pay | Admitting: Emergency Medicine

## 2017-03-30 ENCOUNTER — Other Ambulatory Visit: Payer: Self-pay

## 2017-03-30 DIAGNOSIS — F1320 Sedative, hypnotic or anxiolytic dependence, uncomplicated: Secondary | ICD-10-CM | POA: Insufficient documentation

## 2017-03-30 DIAGNOSIS — Z720 Tobacco use: Secondary | ICD-10-CM

## 2017-03-30 DIAGNOSIS — M797 Fibromyalgia: Secondary | ICD-10-CM | POA: Insufficient documentation

## 2017-03-30 DIAGNOSIS — F17210 Nicotine dependence, cigarettes, uncomplicated: Secondary | ICD-10-CM | POA: Insufficient documentation

## 2017-03-30 DIAGNOSIS — F1290 Cannabis use, unspecified, uncomplicated: Secondary | ICD-10-CM | POA: Insufficient documentation

## 2017-03-30 DIAGNOSIS — F419 Anxiety disorder, unspecified: Secondary | ICD-10-CM | POA: Insufficient documentation

## 2017-03-30 DIAGNOSIS — F1420 Cocaine dependence, uncomplicated: Secondary | ICD-10-CM | POA: Insufficient documentation

## 2017-03-30 DIAGNOSIS — F329 Major depressive disorder, single episode, unspecified: Secondary | ICD-10-CM | POA: Insufficient documentation

## 2017-03-30 DIAGNOSIS — F1190 Opioid use, unspecified, uncomplicated: Secondary | ICD-10-CM | POA: Insufficient documentation

## 2017-03-30 DIAGNOSIS — R569 Unspecified convulsions: Secondary | ICD-10-CM | POA: Insufficient documentation

## 2017-03-30 DIAGNOSIS — F1910 Other psychoactive substance abuse, uncomplicated: Secondary | ICD-10-CM

## 2017-03-30 DIAGNOSIS — F32A Depression, unspecified: Secondary | ICD-10-CM

## 2017-03-30 DIAGNOSIS — Z008 Encounter for other general examination: Secondary | ICD-10-CM

## 2017-03-30 DIAGNOSIS — Z1330 Encounter for screening examination for mental health and behavioral disorders, unspecified: Secondary | ICD-10-CM | POA: Insufficient documentation

## 2017-03-30 DIAGNOSIS — F314 Bipolar disorder, current episode depressed, severe, without psychotic features: Secondary | ICD-10-CM | POA: Insufficient documentation

## 2017-03-30 DIAGNOSIS — F4310 Post-traumatic stress disorder, unspecified: Secondary | ICD-10-CM | POA: Insufficient documentation

## 2017-03-30 DIAGNOSIS — F319 Bipolar disorder, unspecified: Secondary | ICD-10-CM | POA: Insufficient documentation

## 2017-03-30 DIAGNOSIS — F1020 Alcohol dependence, uncomplicated: Secondary | ICD-10-CM | POA: Insufficient documentation

## 2017-03-30 LAB — ACETAMINOPHEN LEVEL: Acetaminophen (Tylenol), Serum: <10 ug/mL — ABNORMAL LOW (ref 10–30)

## 2017-03-30 LAB — COMPREHENSIVE METABOLIC PANEL
ALK PHOS: 42 U/L (ref 38–126)
ALT: 10 U/L — AB (ref 14–54)
AST: 14 U/L — ABNORMAL LOW (ref 15–41)
Albumin: 3.8 g/dL (ref 3.5–5.0)
Anion gap: 9 (ref 5–15)
BILIRUBIN TOTAL: 0.5 mg/dL (ref 0.3–1.2)
BUN: 10 mg/dL (ref 6–20)
CALCIUM: 8.9 mg/dL (ref 8.9–10.3)
CHLORIDE: 112 mmol/L — AB (ref 101–111)
CO2: 21 mmol/L — ABNORMAL LOW (ref 22–32)
CREATININE: 0.85 mg/dL (ref 0.44–1.00)
Glucose, Bld: 109 mg/dL — ABNORMAL HIGH (ref 65–99)
Potassium: 3.5 mmol/L (ref 3.5–5.1)
Sodium: 142 mmol/L (ref 135–145)
TOTAL PROTEIN: 7 g/dL (ref 6.5–8.1)

## 2017-03-30 LAB — ETHANOL

## 2017-03-30 LAB — CBC
HCT: 43.6 % (ref 36.0–46.0)
Hemoglobin: 15 g/dL (ref 12.0–15.0)
MCH: 28 pg (ref 26.0–34.0)
MCHC: 34.4 g/dL (ref 30.0–36.0)
MCV: 81.3 fL (ref 78.0–100.0)
PLATELETS: 296 10*3/uL (ref 150–400)
RBC: 5.36 MIL/uL — ABNORMAL HIGH (ref 3.87–5.11)
RDW: 14.4 % (ref 11.5–15.5)
WBC: 6.9 10*3/uL (ref 4.0–10.5)

## 2017-03-30 LAB — I-STAT BETA HCG BLOOD, ED (MC, WL, AP ONLY)

## 2017-03-30 LAB — SALICYLATE LEVEL

## 2017-03-30 NOTE — H&P (Signed)
Behavioral Health Medical Screening Exam  Kathy [name] is an 32 y.o. female who presents with her mother , requesting intake evaluation due to repeated use of cocaine, Heroin. Alcohol and benzo's since [facility]. She is using 4 mg benzo's daily along with 2 gms of heroin. She is feeling depressed with passive SI. She is denying nay acute ailments, I.e. CP, SOB, vertigo, Abd pains or N/v.   Total Time spent with patient: 20 minutes  Psychiatric Specialty Exam: Physical Exam  Constitutional: She is oriented to person, place, and time. She appears well-developed and well-nourished. No distress.  HENT:  Head: Normocephalic.  Eyes: Pupils are equal, round, and reactive to light.  Respiratory: Effort normal and breath sounds normal. No respiratory distress.  GI: Soft. Bowel sounds are normal.  Neurological: She is alert and oriented to person, place, and time. No cranial nerve deficit.  Skin: Skin is warm and dry. She is not diaphoretic.  Psychiatric: Her speech is normal. She is slowed and withdrawn. Cognition and memory are impaired. She expresses impulsivity and inappropriate judgment. She exhibits a depressed mood. She expresses suicidal ideation.    Review of Systems  Constitutional: Negative for chills, diaphoresis, fever and malaise/fatigue.  Respiratory: Negative for shortness of breath.   Cardiovascular: Negative for chest pain and palpitations.  Gastrointestinal: Negative for abdominal pain, nausea and vomiting.  Neurological: Negative for dizziness and seizures.  Psychiatric/Behavioral: Positive for depression, substance abuse and suicidal ideas.    There were no vitals taken for this visit.There is no height or weight on file to calculate BMI.  General Appearance: Casual  Eye Contact:  Good  Speech:  Clear and Coherent  Volume:  Normal  Mood:  Depressed  Affect:  Congruent  Thought Process:  Goal Directed  Orientation:  Full (Time, Place, and Person)   Thought Content:  Logical  Suicidal Thoughts:  Yes.  without intent/plan  Homicidal Thoughts:  No  Memory:  Immediate;   Fair  Judgement:  Poor  Insight:  Lacking  Psychomotor Activity:  Negative  Concentration: Concentration: Fair  Recall:  [employer]Fair  Fund of Knowledge:Fair  Language: Good  Akathisia:  Negative  Handed:  Right  AIMS (if indicated):     Assets:  Desire for Improvement  Sleep:       Musculoskeletal: Strength & Muscle Tone: within normal limits Gait & Station: normal Patient leans: N/A  There were no vitals taken for this visit.  Recommendations:  Based on my evaluation the patient appears to have an emergency medical condition for which I recommend the patient be transferred to the emergency department for further evaluation.  Kerry HoughSpencer E Mauria Asquith, PA-C 03/30/2017, 9:49 PM
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
## 2017-03-30 NOTE — ED Notes (Signed)
Bed: WTR5 Expected date:  Expected time:  Means of arrival:  Comments: 

## 2017-03-31 ENCOUNTER — Inpatient Hospital Stay (HOSPITAL_COMMUNITY)
Admission: AD | Admit: 2017-03-31 | Discharge: 2017-04-05 | DRG: 882 | Disposition: A | Discharge status: Home / Self Care | Payer: Federal, State, Local not specified - Other | Source: Intra-hospital | Attending: Psychiatry | Admitting: Psychiatry

## 2017-03-31 ENCOUNTER — Encounter (HOSPITAL_COMMUNITY): Payer: Self-pay

## 2017-03-31 ENCOUNTER — Other Ambulatory Visit: Payer: Self-pay

## 2017-03-31 DIAGNOSIS — F4310 Post-traumatic stress disorder, unspecified: Principal | ICD-10-CM | POA: Diagnosis present

## 2017-03-31 DIAGNOSIS — F1024 Alcohol dependence with alcohol-induced mood disorder: Secondary | ICD-10-CM | POA: Diagnosis present

## 2017-03-31 DIAGNOSIS — F1124 Opioid dependence with opioid-induced mood disorder: Secondary | ICD-10-CM | POA: Diagnosis present

## 2017-03-31 DIAGNOSIS — F1120 Opioid dependence, uncomplicated: Secondary | ICD-10-CM | POA: Diagnosis present

## 2017-03-31 DIAGNOSIS — F10239 Alcohol dependence with withdrawal, unspecified: Secondary | ICD-10-CM | POA: Diagnosis present

## 2017-03-31 DIAGNOSIS — F410 Panic disorder [episodic paroxysmal anxiety] without agoraphobia: Secondary | ICD-10-CM

## 2017-03-31 DIAGNOSIS — F319 Bipolar disorder, unspecified: Secondary | ICD-10-CM | POA: Diagnosis present

## 2017-03-31 DIAGNOSIS — F1324 Sedative, hypnotic or anxiolytic dependence with sedative, hypnotic or anxiolytic-induced mood disorder: Secondary | ICD-10-CM | POA: Diagnosis present

## 2017-03-31 DIAGNOSIS — M797 Fibromyalgia: Secondary | ICD-10-CM | POA: Diagnosis present

## 2017-03-31 DIAGNOSIS — F17210 Nicotine dependence, cigarettes, uncomplicated: Secondary | ICD-10-CM

## 2017-03-31 DIAGNOSIS — G4710 Hypersomnia, unspecified: Secondary | ICD-10-CM | POA: Diagnosis present

## 2017-03-31 DIAGNOSIS — Z9114 Patient's other noncompliance with medication regimen: Secondary | ICD-10-CM

## 2017-03-31 DIAGNOSIS — Z62810 Personal history of physical and sexual abuse in childhood: Secondary | ICD-10-CM

## 2017-03-31 DIAGNOSIS — F1424 Cocaine dependence with cocaine-induced mood disorder: Secondary | ICD-10-CM | POA: Diagnosis present

## 2017-03-31 DIAGNOSIS — F13239 Sedative, hypnotic or anxiolytic dependence with withdrawal, unspecified: Secondary | ICD-10-CM | POA: Diagnosis present

## 2017-03-31 DIAGNOSIS — F411 Generalized anxiety disorder: Secondary | ICD-10-CM | POA: Diagnosis present

## 2017-03-31 DIAGNOSIS — G4700 Insomnia, unspecified: Secondary | ICD-10-CM

## 2017-03-31 DIAGNOSIS — F1920 Other psychoactive substance dependence, uncomplicated: Secondary | ICD-10-CM

## 2017-03-31 DIAGNOSIS — Z765 Malingerer [conscious simulation]: Secondary | ICD-10-CM | POA: Diagnosis not present

## 2017-03-31 DIAGNOSIS — F419 Anxiety disorder, unspecified: Secondary | ICD-10-CM

## 2017-03-31 DIAGNOSIS — M2550 Pain in unspecified joint: Secondary | ICD-10-CM

## 2017-03-31 DIAGNOSIS — R450 Nervousness: Secondary | ICD-10-CM

## 2017-03-31 DIAGNOSIS — M791 Myalgia, unspecified site: Secondary | ICD-10-CM

## 2017-03-31 MED ORDER — CHLORDIAZEPOXIDE HCL 25 MG PO CAPS
25.0000 mg | ORAL_CAPSULE | Freq: Every day | ORAL | Status: AC
  Administered 2017-04-04: 25 mg via ORAL
  Filled 2017-03-31: qty 1

## 2017-03-31 MED ORDER — HYDROXYZINE HCL 25 MG PO TABS
25.0000 mg | ORAL_TABLET | Freq: Four times a day (QID) | ORAL | Status: DC | PRN
  Administered 2017-04-02: 25 mg via ORAL
  Filled 2017-03-31 (×2): qty 1

## 2017-03-31 MED ORDER — GABAPENTIN 300 MG PO CAPS: 600.0000 mg | ORAL_CAPSULE | ORAL | Status: DC

## 2017-03-31 MED ORDER — NAPROXEN 500 MG PO TABS
500.0000 mg | ORAL_TABLET | Freq: Two times a day (BID) | ORAL | Status: AC | PRN
  Administered 2017-03-31 – 2017-04-04 (×4): 500 mg via ORAL
  Filled 2017-03-31 (×6): qty 1

## 2017-03-31 MED ORDER — DICYCLOMINE HCL 20 MG PO TABS: 20.0000 mg | ORAL_TABLET | Freq: Four times a day (QID) | ORAL | Status: DC | PRN

## 2017-03-31 MED ORDER — CHLORDIAZEPOXIDE HCL 25 MG PO CAPS
25.0000 mg | ORAL_CAPSULE | ORAL | Status: AC
  Administered 2017-04-02 – 2017-04-03 (×2): 25 mg via ORAL
  Filled 2017-03-31 (×2): qty 1

## 2017-03-31 MED ORDER — LOPERAMIDE HCL 2 MG PO CAPS: 2.0000 mg | ORAL_CAPSULE | ORAL | Status: AC | PRN

## 2017-03-31 MED ORDER — GABAPENTIN 300 MG PO CAPS
600.0000 mg | ORAL_CAPSULE | ORAL | Status: DC
Start: 2017-03-31 — End: 2017-04-05
  Administered 2017-03-31 – 2017-04-04 (×14): 600 mg via ORAL
  Filled 2017-03-31 (×2): qty 2
  Filled 2017-03-31: qty 84
  Filled 2017-03-31: qty 2
  Filled 2017-03-31: qty 84
  Filled 2017-03-31 (×13): qty 2
  Filled 2017-03-31: qty 84
  Filled 2017-03-31 (×2): qty 2

## 2017-03-31 MED ORDER — ONDANSETRON 4 MG PO TBDP: 4.0000 mg | ORAL_TABLET | Freq: Four times a day (QID) | ORAL | Status: DC | PRN

## 2017-03-31 MED ORDER — QUETIAPINE FUMARATE 25 MG PO TABS
25.0000 mg | ORAL_TABLET | Freq: Three times a day (TID) | ORAL | Status: DC
  Administered 2017-03-31 – 2017-04-02 (×6): 25 mg via ORAL
  Filled 2017-03-31 (×9): qty 1

## 2017-03-31 MED ORDER — QUETIAPINE FUMARATE 100 MG PO TABS: 100.0000 mg | ORAL_TABLET | Freq: Two times a day (BID) | ORAL | Status: DC

## 2017-03-31 MED ORDER — THIAMINE HCL 100 MG/ML IJ SOLN: 100.0000 mg | Freq: Once | INTRAMUSCULAR | Status: DC

## 2017-03-31 MED ORDER — NICOTINE POLACRILEX 2 MG MT GUM
2.0000 mg | CHEWING_GUM | OROMUCOSAL | Status: DC | PRN
  Administered 2017-03-31 – 2017-04-04 (×12): 2 mg via ORAL
  Filled 2017-03-31: qty 1
  Filled 2017-03-31: qty 5
  Filled 2017-03-31 (×2): qty 1

## 2017-03-31 MED ORDER — CLONIDINE HCL 0.1 MG PO TABS: 0.1000 mg | ORAL_TABLET | Freq: Every day | ORAL | Status: DC

## 2017-03-31 MED ORDER — ONDANSETRON 4 MG PO TBDP
4.0000 mg | ORAL_TABLET | Freq: Four times a day (QID) | ORAL | Status: AC | PRN
  Administered 2017-03-31 – 2017-04-03 (×2): 4 mg via ORAL
  Filled 2017-03-31 (×2): qty 1

## 2017-03-31 MED ORDER — CHLORDIAZEPOXIDE HCL 25 MG PO CAPS
25.0000 mg | ORAL_CAPSULE | Freq: Four times a day (QID) | ORAL | Status: AC | PRN
  Administered 2017-04-01 – 2017-04-03 (×2): 25 mg via ORAL
  Filled 2017-03-31 (×2): qty 1

## 2017-03-31 MED ORDER — ACETAMINOPHEN 325 MG PO TABS
650.0000 mg | ORAL_TABLET | Freq: Four times a day (QID) | ORAL | Status: DC | PRN
  Administered 2017-03-31 – 2017-04-04 (×6): 650 mg via ORAL
  Filled 2017-03-31 (×7): qty 2

## 2017-03-31 MED ORDER — QUETIAPINE FUMARATE 100 MG PO TABS
100.0000 mg | ORAL_TABLET | Freq: Every day | ORAL | Status: DC
  Filled 2017-03-31: qty 1

## 2017-03-31 MED ORDER — PRAZOSIN HCL 2 MG PO CAPS
2.0000 mg | ORAL_CAPSULE | Freq: Every day | ORAL | Status: DC
  Administered 2017-03-31 – 2017-04-03 (×4): 2 mg via ORAL
  Filled 2017-03-31 (×5): qty 1

## 2017-03-31 MED ORDER — PRAZOSIN HCL 1 MG PO CAPS
2.0000 mg | ORAL_CAPSULE | Freq: Every day | ORAL | Status: DC
  Filled 2017-03-31: qty 2

## 2017-03-31 MED ORDER — NICOTINE 21 MG/24HR TD PT24: 21.0000 mg | MEDICATED_PATCH | Freq: Every day | TRANSDERMAL | Status: DC

## 2017-03-31 MED ORDER — PRAZOSIN HCL 1 MG PO CAPS
1.0000 mg | ORAL_CAPSULE | Freq: Every day | ORAL | Status: DC
  Filled 2017-03-31: qty 1

## 2017-03-31 MED ORDER — GABAPENTIN 300 MG PO CAPS
600.0000 mg | ORAL_CAPSULE | Freq: Once | ORAL | Status: AC
  Administered 2017-03-31: 600 mg via ORAL
  Filled 2017-03-31 (×2): qty 2

## 2017-03-31 MED ORDER — CYCLOBENZAPRINE HCL 10 MG PO TABS: 10.0000 mg | ORAL_TABLET | Freq: Two times a day (BID) | ORAL | Status: DC | PRN

## 2017-03-31 MED ORDER — VITAMIN B-1 100 MG PO TABS
100.0000 mg | ORAL_TABLET | Freq: Every day | ORAL | Status: DC
  Administered 2017-04-01 – 2017-04-04 (×4): 100 mg via ORAL
  Filled 2017-03-31 (×6): qty 1

## 2017-03-31 MED ORDER — CLONIDINE HCL 0.1 MG PO TABS: 0.1000 mg | ORAL_TABLET | ORAL | Status: DC

## 2017-03-31 MED ORDER — LOPERAMIDE HCL 2 MG PO CAPS: 2.0000 mg | ORAL_CAPSULE | ORAL | Status: DC | PRN

## 2017-03-31 MED ORDER — DIVALPROEX SODIUM 500 MG PO DR TAB: 500.0000 mg | DELAYED_RELEASE_TABLET | Freq: Two times a day (BID) | ORAL | Status: DC

## 2017-03-31 MED ORDER — METHOCARBAMOL 500 MG PO TABS
500.0000 mg | ORAL_TABLET | Freq: Three times a day (TID) | ORAL | Status: AC | PRN
  Administered 2017-03-31 – 2017-04-04 (×12): 500 mg via ORAL
  Filled 2017-03-31 (×13): qty 1

## 2017-03-31 MED ORDER — BENZTROPINE MESYLATE 0.5 MG PO TABS
0.5000 mg | ORAL_TABLET | Freq: Two times a day (BID) | ORAL | Status: DC
  Administered 2017-03-31 – 2017-04-04 (×10): 0.5 mg via ORAL
  Filled 2017-03-31 (×7): qty 1
  Filled 2017-03-31: qty 28
  Filled 2017-03-31 (×2): qty 1
  Filled 2017-03-31: qty 28
  Filled 2017-03-31 (×4): qty 1

## 2017-03-31 MED ORDER — ADULT MULTIVITAMIN W/MINERALS CH
1.0000 | ORAL_TABLET | Freq: Every day | ORAL | Status: DC
  Administered 2017-03-31 – 2017-04-04 (×5): 1 via ORAL
  Filled 2017-03-31 (×8): qty 1

## 2017-03-31 MED ORDER — CLONIDINE HCL 0.1 MG PO TABS
0.1000 mg | ORAL_TABLET | Freq: Four times a day (QID) | ORAL | Status: DC
  Administered 2017-03-31: 0.1 mg via ORAL
  Filled 2017-03-31 (×5): qty 1

## 2017-03-31 MED ORDER — DIVALPROEX SODIUM 500 MG PO DR TAB
500.0000 mg | DELAYED_RELEASE_TABLET | Freq: Two times a day (BID) | ORAL | Status: DC
  Administered 2017-03-31 – 2017-04-04 (×10): 500 mg via ORAL
  Filled 2017-03-31 (×3): qty 1
  Filled 2017-03-31: qty 28
  Filled 2017-03-31 (×4): qty 1
  Filled 2017-03-31: qty 28
  Filled 2017-03-31 (×7): qty 1

## 2017-03-31 MED ORDER — ZOLPIDEM TARTRATE 5 MG PO TABS: 5.0000 mg | ORAL_TABLET | Freq: Every evening | ORAL | Status: DC | PRN

## 2017-03-31 MED ORDER — ACETAMINOPHEN 325 MG PO TABS: 650.0000 mg | ORAL_TABLET | ORAL | Status: DC | PRN

## 2017-03-31 MED ORDER — ALUM & MAG HYDROXIDE-SIMETH 200-200-20 MG/5ML PO SUSP: 30.0000 mL | ORAL | Status: DC | PRN

## 2017-03-31 MED ORDER — CHLORDIAZEPOXIDE HCL 25 MG PO CAPS
25.0000 mg | ORAL_CAPSULE | Freq: Three times a day (TID) | ORAL | Status: AC
  Administered 2017-04-01 – 2017-04-02 (×3): 25 mg via ORAL
  Filled 2017-03-31 (×3): qty 1

## 2017-03-31 MED ORDER — HYDROXYZINE HCL 25 MG PO TABS
25.0000 mg | ORAL_TABLET | Freq: Three times a day (TID) | ORAL | Status: DC | PRN
  Administered 2017-03-31: 25 mg via ORAL
  Filled 2017-03-31: qty 1

## 2017-03-31 MED ORDER — MAGNESIUM HYDROXIDE 400 MG/5ML PO SUSP
30.0000 mL | Freq: Every day | ORAL | Status: DC | PRN
  Administered 2017-04-03 – 2017-04-04 (×2): 30 mL via ORAL
  Filled 2017-03-31 (×2): qty 30

## 2017-03-31 MED ORDER — QUETIAPINE FUMARATE 50 MG PO TABS
150.0000 mg | ORAL_TABLET | Freq: Every day | ORAL | Status: DC
  Administered 2017-03-31 – 2017-04-02 (×3): 150 mg via ORAL
  Filled 2017-03-31 (×5): qty 3

## 2017-03-31 MED ORDER — ALUM & MAG HYDROXIDE-SIMETH 200-200-20 MG/5ML PO SUSP: 30.0000 mL | Freq: Four times a day (QID) | ORAL | Status: DC | PRN

## 2017-03-31 MED ORDER — CHLORDIAZEPOXIDE HCL 25 MG PO CAPS
25.0000 mg | ORAL_CAPSULE | Freq: Four times a day (QID) | ORAL | Status: AC
  Administered 2017-03-31 – 2017-04-01 (×4): 25 mg via ORAL
  Filled 2017-03-31 (×4): qty 1

## 2017-03-31 MED ORDER — ONDANSETRON HCL 4 MG PO TABS: 4.0000 mg | ORAL_TABLET | Freq: Three times a day (TID) | ORAL | Status: DC | PRN

## 2017-03-31 NOTE — ED Notes (Signed)
[facility] was called. Patient waiting on [facility].
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
## 2017-03-31 NOTE — Progress Notes (Signed)
Pt attended the evening wrap up group. Pt was a little irritable and anxious but was attentive and supportive. Pt reports that being here is a good thing, she guesses. Pt shared that she is grateful of her mom and dad and that she is interested in long term treatment and [facility].  Pt did not want to answer the three loves for Valentine's Day question reporting that it just didn't matter much to her right now.
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
## 2017-03-31 NOTE — BH Assessment (Signed)
[facility] Assessment Progress Note   Clinician [facility] to [facility], [place] and confirmed that patient was medically cleared.  [facility] Dr. Jama Flavorsobos.  Pt needs to sign voluntary admission papers prior to transport via [facility].  Clinician informed nurse Elsie RaJeneen of patient acceptance.

## 2017-03-31 NOTE — Tx Team (Signed)
Initial Treatment Plan 03/31/2017 4:21 AM [name] ZOX:[id number]    PATIENT STRESSORS: Financial difficulties Health problems Medication change or noncompliance Substance abuse   PATIENT STRENGTHS: Ability for insight Average or above average intelligence General fund of knowledge Supportive family/friends   PATIENT IDENTIFIED PROBLEMS:   depression  anxiety  Substance abuse  Medication noncompliance  "get medication right"  "go into a long term treatment"         DISCHARGE CRITERIA:  Ability to meet basic life and health needs Improved stabilization in mood, thinking, and/or behavior Motivation to continue treatment in a less acute level of care Need for constant or close observation no longer present Verbal commitment to aftercare and medication compliance Withdrawal symptoms are absent or subacute and managed without 24-hour nursing intervention  PRELIMINARY DISCHARGE PLAN: Attend aftercare/continuing care group Attend 12-step recovery group Return to previous living arrangement  PATIENT/FAMILY INVOLVEMENT: This treatment plan has been presented to and reviewed with the patient, Kathy [name],The patient and family have been given the opportunity to ask questions and make suggestions.  JEHU-APPIAH, Salley ScarletLINDA K, RN 03/31/2017, 4:21 AM

## 2017-03-31 NOTE — Progress Notes (Signed)
Patient ID: Kathy [name], female   DOB: 1985-09-15, 32 y.o.   MRN: [id number]  DAR: Pt. Denies SI/HI and A/V Hallucinations. Patient reports withdrawal symptoms including nausea, anxiety, cold/hot sweats, and generalized discomfort. Support and encouragement provided to the patient. Scheduled and PRN medications administered to patient per physician's orders. Patient is seen in the milieu and interacting with her peers. Writer requested that patient fill out her daily inventory sheet but she has yet to do so. Q15 minute checks are maintained for safety.

## 2017-03-31 NOTE — ED Notes (Signed)
[facility] picked up patient. [facility] work and all her belongings.

## 2017-03-31 NOTE — ED Triage Notes (Addendum)
Patient wants detox from benzos and heroine. Patient does not want to hurt herself or anybody. Patient states that she is not on the right medication.

## 2017-03-31 NOTE — Progress Notes (Signed)
Patient ID: Kathy [name], female   DOB: [birth year], 32 y.o.   MRN: [id number] Admission note: D:Patient is a voluntary admission for detox from benzo (xanax, clonidine), cocaine and alcohol. Pt reports taking about 4gm of xanax, 2 gm cocaine, and half pint of alcohol daily. Pt reports seizure with withdrawals. Pt reports she has not been able to afford medication for the past 2 months. Pt reports she has been self medication with above substances to. Current charge against pt is felony to distribute and sell drugs and DUI with cort date on 3/26. Pt reports not currently receiving outpatient services, no current medication.  A: Pt admitted to unit per protocol, skin assessment and belonging search done. No skin issues noted. Consent signed by pt. Pt educated on therapeutic milieu rules. Pt was introduced to milieu by nursing staff. Fall risk / suicide safety plan explained to the patient. 15 minutes checks started for safety. R: Pt was receptive to education. Writer offered support.
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
## 2017-03-31 NOTE — [facility] Group Notes (Signed)
LCSW Group Therapy Note  03/31/2017 1:15pm  Type of Therapy/Topic:  Group Therapy:  Feelings about Diagnosis  Participation Level:  Active   Description of Group:   This group will allow patients to explore their thoughts and feelings about diagnoses they have received. Patients will be guided to explore their level of understanding and acceptance of these diagnoses. Facilitator will encourage patients to process their thoughts and feelings about the reactions of others to their diagnosis and will guide patients in identifying ways to discuss their diagnosis with significant others in their lives. This group will be process-oriented, with patients participating in exploration of their own experiences, giving and receiving support, and processing challenge from other group members.   Therapeutic Goals: 1. Patient will demonstrate understanding of diagnosis as evidenced by identifying two or more symptoms of the disorder 2. Patient will be able to express two feelings regarding the diagnosis 3. Patient will demonstrate their ability to communicate their needs through discussion and/or role play  Summary of Patient Progress:  [name] was attentive and engaged during today's processing group. She shared that she does not think it is a good think to be diagnosed with mental illness and cannot tell "what is the drug use and what symptoms are just me." She continues to make progress in the group setting with improving insight.   Therapeutic Modalities:   Cognitive Behavioral Therapy Brief Therapy Feelings Identification    Ledell PeoplesHeather N Smart, LCSW 03/31/2017 12:46 PM

## 2017-03-31 NOTE — [facility] Suicide Risk Assessment (Signed)
[facility] INPATIENT:  Family/Significant Other Suicide Prevention Education  Suicide Prevention Education:  Patient Refusal for Family/Significant Other Suicide Prevention Education: The patient Kathy [name] has refused to provide written consent for family/significant other to be provided Family/Significant Other Suicide Prevention Education during admission and/or prior to discharge.  Physician notified.  Metro Kungngel M Mariena Meares 03/31/2017, 10:12 AM

## 2017-03-31 NOTE — [facility] Suicide Risk Assessment (Signed)
[facility] Admission Suicide Risk Assessment   Nursing information obtained from:  Patient Demographic factors:  Caucasian, Low socioeconomic status, Unemployed Current Mental Status:  NA Loss Factors:  Legal issues, Financial problems / change in socioeconomic status Historical Factors:  Family history of mental illness or substance abuse Risk Reduction Factors:  Responsible for children under 33 years of age, Sense of responsibility to family, Living with another person, especially a relative, Positive social support  Total Time spent with patient: 1 hour Principal Problem: PTSD (post-traumatic stress disorder) Diagnosis:   Patient Active Problem List   Diagnosis Date Noted  . Polysubstance (excluding opioids) dependence, daily use (HCC) [F19.20] 11/30/2016  . Polysubstance dependence including opioid type drug, continuous use (HCC) [F11.20, F19.20] 11/30/2016  . History of 3 spontaneous abortions [Z87.42] 03/24/2016  . Eczema [L30.9] 03/24/2016  . Depression [F32.9] 03/24/2016  . Asthma without status asthmaticus without complication [J45.909] 03/24/2016  . Anxiety [F41.9] 03/24/2016  . Opiate abuse, continuous (HCC) [F11.10] 03/24/2016  . Fibromyalgia [M79.7] 04/17/2015  . Chronic hepatitis C virus infection (HCC) [B18.2] 02/28/2015  . Chronic migraine [G43.709] 07/23/2014  . Bipolar disorder (HCC) [F31.9] 05/10/2013  . PTSD (post-traumatic stress disorder) [F43.10] 05/10/2013  . Substance abuse [facility]) [F19.10] 10/03/2012   Subjective Data:   Kathy [name] is a 32 y/o F with history of PTSD and polysubstance abuse who was admitted voluntarily with worsening depression, anxiety, and relapse of substance use. Pt had been off of her psychotropic medications for 2 months. Pt has relevant history of DC from [facility] in October 2018 and then going to [facility] for substance use treatment, but she only stayed for 2 weeks. Pt then was in the [place] area with her mother and had relapse of substance use  and worsening of mood symptoms, so her mother brought her to [facility] for additional treatment and evaluation.  Upon initial presentation, pt shares, "I relapsed and I quit taking my meds for two months." Pt shares she stopped her medications because "I have a lot of stress in my life; the father of my kids is going to federal prison for a long time for selling heroin." Pt reports that in early December she stopped taking her medications and relapsed on heroin, cocaine, alcohol, opiate prescription pills, and benzodiazepines. She notes snorting about 1 gram of heroin daily, using up to 300-400mg  of Roxicodone daily, using cocaine as recently as 2 days ago, and mixing various klonopin and xanax tablets daily with total of 4-8 mg of benzodiazepine between them. She also has been drinking up to a pint of hard alcohol her day. She reports depressed mood, low energy, guilty feelings, anxiety, and poor motivation. She denies SI/HI/AH/VH. She denies other symptoms of depression, anxiety, OCD, and PTSD.  Discussed with patient about treatment options. She would like to resume her previous medications of seroquel, prazosin, and depakote. Pt is in agreement to meet with SW team and discuss substance use treatment options. We will also start CIWA for alcohol/benzo withdrawal.   Continued Clinical Symptoms:  Alcohol Use Disorder Identification Test Final Score (AUDIT): 11 The "Alcohol Use Disorders Identification Test", Guidelines for Use in Primary Care, Second Edition.  World Science writer [facility]). Score between 0-7:  no or low risk or alcohol related problems. Score between 8-15:  moderate risk of alcohol related problems. Score between 16-19:  high risk of alcohol related problems. Score 20 or above:  warrants further diagnostic evaluation for alcohol dependence and treatment.   CLINICAL FACTORS:   Severe Anxiety  and/or Agitation Depression:   Comorbid alcohol abuse/dependence Alcohol/Substance  Abuse/Dependencies More than one psychiatric diagnosis Unstable or Poor Therapeutic Relationship Previous Psychiatric Diagnoses and Treatments   Musculoskeletal: Strength & Muscle Tone: within normal limits Gait & Station: normal Patient leans: N/A  Psychiatric Specialty Exam: Physical Exam  Nursing note and vitals reviewed.   Review of Systems  Constitutional: Negative for chills and fever.  Cardiovascular: Negative for chest pain.  Gastrointestinal: Negative for abdominal pain, heartburn, nausea and vomiting.  Psychiatric/Behavioral: Positive for depression and substance abuse. Negative for hallucinations and suicidal ideas. The patient is nervous/anxious and has insomnia.     Blood pressure 107/72, pulse 72, temperature 97.7 F (36.5 C), temperature source Oral, resp. rate 16, height 5' 1.81" (1.57 m), weight 73 kg (161 lb).Body mass index is 29.63 kg/m.  General Appearance: Casual  Eye Contact:  Fair  Speech:  Clear and Coherent and Normal Rate  Volume:  Normal  Mood:  Anxious and Depressed  Affect:  Congruent and Constricted  Thought Process:  Coherent and Goal Directed  Orientation:  Full (Time, Place, and Person)  Thought Content:  Logical  Suicidal Thoughts:  No  Homicidal Thoughts:  No  Memory:  Immediate;   Fair Recent;   Fair Remote;   Fair  Judgement:  Poor  Insight:  Lacking  Psychomotor Activity:  Normal  Concentration:  Concentration: Poor  Recall:  Poor  Fund of Knowledge:  Poor  Language:  Poor  Akathisia:  No  Handed:    AIMS (if indicated):     Assets:  Communication Skills Leisure Time Physical Health Resilience  ADL's:  Intact  Cognition:  WNL  Sleep:  Number of Hours: 1.25(late morning admission)      COGNITIVE FEATURES THAT CONTRIBUTE TO RISK:  None    SUICIDE RISK:   Minimal: No identifiable suicidal ideation.  Patients presenting with no risk factors but with morbid ruminations; may be classified as minimal risk based on the  severity of the depressive symptoms  PLAN OF CARE:   -admit to inpatient level of care  - PTSD   - Restart seroquel 25mg  po TID and seroquel 150mg  po qhs   - Restart prazosin 2mg  po qhs   - Restart depakote 500mg  po BID  -Benzodiazepine/alcohol withdrawal    - Start CIWA with librium  -Continue all other current orders without changes.  I certify that inpatient services furnished can reasonably be expected to improve the patient's condition.   Micheal Likenshristopher T Doyce Stonehouse, MD 03/31/2017, 5:50 PM

## 2017-04-01 DIAGNOSIS — F1920 Other psychoactive substance dependence, uncomplicated: Secondary | ICD-10-CM

## 2017-04-01 DIAGNOSIS — F1120 Opioid dependence, uncomplicated: Secondary | ICD-10-CM

## 2017-04-01 DIAGNOSIS — F4310 Post-traumatic stress disorder, unspecified: Principal | ICD-10-CM

## 2017-04-01 NOTE — Tx Team (Signed)
Interdisciplinary Treatment and Diagnostic Plan Update  04/01/2017 Time of Session: 0830AM Kathy [name] MRN: [id number]  Principal Diagnosis: PTSD (post-traumatic stress disorder)  Secondary Diagnoses: Principal Problem:   PTSD (post-traumatic stress disorder) Active Problems:   Polysubstance dependence including opioid type drug, continuous use (HCC)   Current Medications:  Current Facility-Administered Medications  Medication Dose Route Frequency Provider Last Rate Last Dose  . acetaminophen (TYLENOL) tablet 650 mg  650 mg Oral Q6H PRN Kerry Hough, PA-C   650 mg at 03/31/17 0816  . alum & mag hydroxide-simeth (MAALOX/MYLANTA) 200-200-20 MG/5ML suspension 30 mL  30 mL Oral Q4H PRN Kerry Hough, PA-C      . benztropine (COGENTIN) tablet 0.5 mg  0.5 mg Oral BID Donell Sievert E, PA-C   0.5 mg at 04/01/17 0758  . chlordiazePOXIDE (LIBRIUM) capsule 25 mg  25 mg Oral Q6H PRN Nwoko, Agnes I, NP      . chlordiazePOXIDE (LIBRIUM) capsule 25 mg  25 mg Oral TID Armandina Stammer I, NP       Followed by  . [START ON 04/02/2017] chlordiazePOXIDE (LIBRIUM) capsule 25 mg  25 mg Oral BH-qamhs Armandina Stammer I, NP       Followed by  . [START ON 04/04/2017] chlordiazePOXIDE (LIBRIUM) capsule 25 mg  25 mg Oral Daily Nwoko, Agnes I, NP      . divalproex (DEPAKOTE) DR tablet 500 mg  500 mg Oral Q12H Donell Sievert E, PA-C   500 mg at 04/01/17 0759  . gabapentin (NEURONTIN) capsule 600 mg  600 mg Oral [id number] Armandina Stammer I, NP   600 mg at 04/01/17 0758  . hydrOXYzine (ATARAX/VISTARIL) tablet 25 mg  25 mg Oral Q6H PRN Armandina Stammer I, NP      . loperamide (IMODIUM) capsule 2-4 mg  2-4 mg Oral PRN Nwoko, Agnes I, NP      . magnesium hydroxide (MILK OF MAGNESIA) suspension 30 mL  30 mL Oral Daily PRN Donell Sievert E, PA-C      . methocarbamol (ROBAXIN) tablet 500 mg  500 mg Oral Q8H PRN Donell Sievert E, PA-C   500 mg at 04/01/17 0800  . multivitamin with minerals tablet 1 tablet  1 tablet Oral Daily  Armandina Stammer I, NP   1 tablet at 04/01/17 0759  . naproxen (NAPROSYN) tablet 500 mg  500 mg Oral BID PRN Kerry Hough, PA-C   500 mg at 03/31/17 1507  . nicotine polacrilex (NICORETTE) gum 2 mg  2 mg Oral PRN Cobos, Rockey Situ, MD   2 mg at 04/01/17 0804  . ondansetron (ZOFRAN-ODT) disintegrating tablet 4 mg  4 mg Oral Q6H PRN Armandina Stammer I, NP   4 mg at 03/31/17 1211  . prazosin (MINIPRESS) capsule 2 mg  2 mg Oral QHS Micheal Likens, MD   2 mg at 03/31/17 2049  . QUEtiapine (SEROQUEL) tablet 150 mg  150 mg Oral QHS Micheal Likens, MD   150 mg at 03/31/17 2048  . QUEtiapine (SEROQUEL) tablet 25 mg  25 mg Oral TID Armandina Stammer I, NP   25 mg at 04/01/17 0757  . thiamine (B-1) injection 100 mg  100 mg Intramuscular Once Nwoko, Nicole Kindred I, NP      . thiamine (VITAMIN B-1) tablet 100 mg  100 mg Oral Daily Nwoko, Agnes I, NP   100 mg at 04/01/17 0757   PTA Medications: Medications Prior to Admission  Medication Sig Dispense Refill Last Dose  . benztropine (COGENTIN) 0.5  MG tablet Take 1 tablet (0.5 mg total) by mouth 2 (two) times daily. (Patient not taking: Reported on 03/31/2017) 60 tablet 0 Not Taking at Unknown time  . divalproex (DEPAKOTE) 500 MG DR tablet Take 1 tablet (500 mg total) by mouth every 12 (twelve) hours. (Patient not taking: Reported on 03/31/2017) 60 tablet 0 Not Taking at Unknown time  . gabapentin (NEURONTIN) 300 MG capsule Take 2 capsules (600 mg total) by mouth 3 (three) times daily at 8am, 3pm and bedtime. (Patient not taking: Reported on 03/31/2017) 176 capsule 0 Not Taking at Unknown time  . nicotine polacrilex (NICORETTE) 2 MG gum Take 1 each (2 mg total) by mouth as needed for smoking cessation. (Patient not taking: Reported on 03/31/2017) 100 tablet 0 Not Taking at Unknown time  . prazosin (MINIPRESS) 2 MG capsule Take 1 capsule (2 mg total) by mouth at bedtime. (Patient not taking: Reported on 03/31/2017) 30 capsule 0 Not Taking at Unknown time  .  QUEtiapine (SEROQUEL) 100 MG tablet Take 3 tablets (300 mg total) by mouth at bedtime. Take 1 tablet (100mg  total) by mouth in the morning (Patient not taking: Reported on 03/31/2017) 120 tablet 0 Not Taking at Unknown time    Patient Stressors: Financial difficulties Health problems Medication change or noncompliance Substance abuse  Patient Strengths: Ability for insight Average or above average intelligence General fund of knowledge Supportive family/friends  Treatment Modalities: Medication Management, Group therapy, Case management,  1 to 1 session with clinician, Psychoeducation, Recreational therapy.   Physician Treatment Plan for Primary Diagnosis: PTSD (post-traumatic stress disorder) Long Term Goal(s): Improvement in symptoms so as ready for discharge Improvement in symptoms so as ready for discharge   Short Term Goals: Ability to identify changes in lifestyle to reduce recurrence of condition will improve Ability to verbalize feelings will improve Ability to demonstrate self-control will improve Ability to identify and develop effective coping behaviors will improve Compliance with prescribed medications will improve Ability to identify triggers associated with substance abuse/mental health issues will improve  Medication Management: Evaluate patient's response, side effects, and tolerance of medication regimen.  Therapeutic Interventions: 1 to 1 sessions, Unit Group sessions and Medication administration.  Evaluation of Outcomes: Progressing  Physician Treatment Plan for Secondary Diagnosis: Principal Problem:   PTSD (post-traumatic stress disorder) Active Problems:   Polysubstance dependence including opioid type drug, continuous use (HCC)  Long Term Goal(s): Improvement in symptoms so as ready for discharge Improvement in symptoms so as ready for discharge   Short Term Goals: Ability to identify changes in lifestyle to reduce recurrence of condition will  improve Ability to verbalize feelings will improve Ability to demonstrate self-control will improve Ability to identify and develop effective coping behaviors will improve Compliance with prescribed medications will improve Ability to identify triggers associated with substance abuse/mental health issues will improve     Medication Management: Evaluate patient's response, side effects, and tolerance of medication regimen.  Therapeutic Interventions: 1 to 1 sessions, Unit Group sessions and Medication administration.  Evaluation of Outcomes: Progressing   RN Treatment Plan for Primary Diagnosis: PTSD (post-traumatic stress disorder) Long Term Goal(s): Knowledge of disease and therapeutic regimen to maintain health will improve  Short Term Goals: Ability to remain free from injury will improve, Ability to verbalize feelings will improve and Ability to identify and develop effective coping behaviors will improve  Medication Management: RN will administer medications as ordered by provider, will assess and evaluate patient's response and provide education to patient for prescribed medication.  RN will report any adverse and/or side effects to prescribing provider.  Therapeutic Interventions: 1 on 1 counseling sessions, Psychoeducation, Medication administration, Evaluate responses to treatment, Monitor vital signs and CBGs as ordered, Perform/monitor CIWA, COWS, AIMS and Fall Risk screenings as ordered, Perform wound care treatments as ordered.  Evaluation of Outcomes: Progressing   LCSW Treatment Plan for Primary Diagnosis: PTSD (post-traumatic stress disorder) Long Term Goal(s): Safe transition to appropriate next level of care at discharge, Engage patient in therapeutic group addressing interpersonal concerns.  Short Term Goals: Engage patient in aftercare planning with referrals and resources, Facilitate patient progression through stages of change regarding substance use diagnoses and  concerns and Identify triggers associated with mental health/substance abuse issues  Therapeutic Interventions: Assess for all discharge needs, 1 to 1 time with Social worker, Explore available resources and support systems, Assess for adequacy in community support network, Educate family and significant other(s) on suicide prevention, Complete Psychosocial Assessment, Interpersonal group therapy.  Evaluation of Outcomes: Progressing   Progress in Treatment: Attending groups: Yes. Participating in groups: Yes. Taking medication as prescribed: Yes. Toleration medication: Yes. Family/Significant other contact made: SPE completed with pt; pt declined to consent to family contact.  Patient understands diagnosis: Yes. Discussing patient identified problems/goals with staff: Yes. Medical problems stabilized or resolved: Yes. Denies suicidal/homicidal ideation: Yes. Issues/concerns per patient self-inventory: No. Other: n/a   New problem(s) identified: No, Describe:  n/a  New Short Term/Long Term Goal(s): detox, medication management for mood stabilization; elimination of SI thoughts; development of comprehensive mental wellness/sobriety plan.   Patient Goal: "To get restarted on my meds and get into a long-term program."   Discharge Plan or Barriers: Pt has screening for possible admission at [facility] on Tuesday, 04/05/17 at [address] for outpatient mental health services. Pt provided with AA/NA list and MHAG pamphlet for additional community supports. CARING SERVICES information also provided to pt.   Reason for Continuation of Hospitalization: Anxiety Depression Medication stabilization Withdrawal symptoms  Estimated Length of Stay: Tuesday, 04/05/17  Attendees: Patient: Kathy [name]  04/01/2017 9:13 AM  Physician:  Dr. Jackquline Berlin MD; Dr. Altamese [place] MD 04/01/2017 9:13 AM  Nursing: Boyd Kerbs RN; Meriam Sprague RN 04/01/2017 9:13 AM  RN Care Manager:x 04/01/2017 9:13 AM  Social Worker:  [employer], LCSW 04/01/2017 9:13 AM  Recreational Therapist: x 04/01/2017 9:13 AM  Other: Armandina Stammer NP; Feliz Beam Money NP 04/01/2017 9:13 AM  Other:  04/01/2017 9:13 AM  Other: 04/01/2017 9:13 AM    Scribe for Treatment Team: Ledell Peoples Smart, LCSW 04/01/2017 9:13 AM

## 2017-04-01 NOTE — Progress Notes (Signed)
Adult Psychoeducational Group Note  Date:  04/01/2017 Time:  1000 Group Topic/Focus:  Recovery Goals:   The focus of this group is to identify appropriate goals for recovery and establish a plan to achieve them.  Participation Level:  Active  Participation Quality:  Appropriate and Attentive  Affect:  Appropriate  Cognitive:  Alert and Appropriate  Insight: Good  Engagement in Group:  Engaged  Modes of Intervention:  Discussion, Rapport Building and Support  Additional Comments:  Group focus on stages of change   Gwenevere Ghazili, Shalane Florendo Patience 04/01/2017, 3:14 PM

## 2017-04-01 NOTE — Plan of Care (Signed)
Patient has been participating in all unit activities. Patient had remained appropriate in all her behaviors while on the unit. Patient has had no injury while on the unit, self inflicted, or otherwise. Patient is taking her medication as prescribed.

## 2017-04-01 NOTE — Progress Notes (Signed)
DAR Note: Pt observed in the dayroom interacting with peers. Pt expressed some irritability; "I don't know why I was schedule to get some medications at ten and the rest at 10." Pt endorsed moderate anxiety and depression. Pt denied SI, HI, pain or AVH. Support and encouragement provided to the Pt. Pt was med compliant. All patient's questions and concerns addressed. 15-minute safety checks continue. Pt attended wrap-up group.

## 2017-04-01 NOTE — Progress Notes (Signed)
D.  Pt pleasant on approach, denies complaints at this time.  Pt does report intermittent pain in her lower right side but states it is not there all the time.  Pt was positive for evening AA group, observed engaged in appropriate interaction with peers on the unit.  Pt denies SI/HI/AVH at this time.  A.  Support and encouragement offered, medication given as ordered  R.  Pt remains safe on the unit, will continue to monitor.

## 2017-04-01 NOTE — [facility] Group Notes (Signed)
[facility] Group Notes:  (Nursing/MHT/Case Management/Adjunct)  Date:  04/01/2017  Time:  6:38 PM  Type of Therapy:  Nurse Education  Participation Level:  Active  Participation Quality:  Intrusive and Inattentive  Affect:  Appropriate  Cognitive:  Appropriate  Insight:  Improving  Engagement in Group:  Distracting, Off Topic and Poor  Modes of Intervention:  Discussion and Education  Summary of Progress/Problems:  Nurse discussed the SMART goal setting guide for coping skills and relapse prevention. Nurse also discussed the Relapse Prevention Plan Worksheet and encouraged all patients to fill the worksheet out, individually, thoughtfully and with the goal of staying heathy.   Almira Barenny G Mckenzi Buonomo 04/01/2017, 6:38 PM

## 2017-04-01 NOTE — Progress Notes (Signed)
Data. Patient denies SI/HI/AVH. Verbally contracts for safety on the unit and to come to staff before acting of any self harm thoughts/feelings.  Patient interacting well with other patients. Patient is demanding of staff and can be irritable at times. On her self assessment she reports 5/10 for depression and 10/10 for hopelessness and anxiety. Her goal for today is, "Finding long term treatment, and medicine straightened out." Action. Emotional support and encouragement offered. Education provided on medication, indications and side effect. Q 15 minute checks done for safety. Response. Safety on the unit maintained through 15 minute checks.  Medications taken as prescribed. Attended groups. Remained calm through out shift.

## 2017-04-01 NOTE — [facility] Group Notes (Signed)
LCSW Group Therapy Note  04/01/2017 1:15pm  Type of Therapy and Topic:  Group Therapy:  Feelings around Relapse and Recovery  Participation Level:  Active   Description of Group:    Patients in this group will discuss emotions they experience before and after a relapse. They will process how experiencing these feelings, or avoidance of experiencing them, relates to having a relapse. Facilitator will guide patients to explore emotions they have related to recovery. Patients will be encouraged to process which emotions are more powerful. They will be guided to discuss the emotional reaction significant others in their lives may have to their relapse or recovery. Patients will be assisted in exploring ways to respond to the emotions of others without this contributing to a relapse.  Therapeutic Goals: 1. Patient will identify two or more emotions that lead to a relapse for them 2. Patient will identify two emotions that result when they relapse 3. Patient will identify two emotions related to recovery 4. Patient will demonstrate ability to communicate their needs through discussion and/or role plays   Summary of Patient Progress:  [name] was attentive and engaged during today's processing group. She learned through her last relapse that "I really need to get into another residential program and stick to it." [name] continues to show progress in the group setting with improving insight.   Therapeutic Modalities:   Cognitive Behavioral Therapy Solution-Focused Therapy Assertiveness Training Relapse Prevention Therapy   Ledell PeoplesHeather N Smart, LCSW 04/01/2017 11:32 AM

## 2017-04-01 NOTE — Progress Notes (Signed)
[facility] MD Progress Note  04/01/2017 1:21 PM Kathy [name]  MRN:  [id number]  Subjective: Kathy [name] reports, "I have a lot of anxiety today. I slept well last night. I can't really tell you or know I'm feeling because I feel tired & sluggish. I feel a little better than when I first came in"  Objective: [name] is a 32 y/o F with history of PTSD and polysubstance abuse who was admitted voluntarily with worsening depression, anxiety, and relapse of substance use. Pt had been off of her psychotropic medications for 2 months. Pt has relevant history of DC from [facility] in October 2018 and then going to [facility] for substance use treatment, but she only stayed for 2 weeks. Pt then was in the [place] area with her mother and had relapse of substance use and worsening of mood symptoms.  Today, 04-01-17,  Kathy [name] is seen, chart reviewed. She is visible on the unit, attending group sessions & activities. Her presentation & out look today is better than she was yesterday upon admission. She remains on the Librium detoxification treatment protocols. She is tolerating the rest of her treatment regimen. She continues to endorse high anxiety level, feeling tired & sluggish. She denies any SIHI, AVH, delusional thoughts or paranoia. She does not appear to be responding to any internal stimuli.  Principal Problem: PTSD (post-traumatic stress disorder)  Diagnosis:   Patient Active Problem List   Diagnosis Date Noted  . Polysubstance dependence including opioid type drug, continuous use ([place]) [F11.20, F19.20] 11/30/2016    Priority: High  . Bipolar disorder ([place]) [F31.9] 05/10/2013    Priority: Medium  . PTSD (post-traumatic stress disorder) [F43.10] 05/10/2013    Priority: Low  . Polysubstance (excluding opioids) dependence, daily use ([place]) [F19.20] 11/30/2016  . History of 3 spontaneous abortions [Z87.42] 03/24/2016  . Eczema [L30.9] 03/24/2016  . Depression [F32.9] 03/24/2016  . Asthma without status asthmaticus  without complication [A26.333] 54/56/2563  . Anxiety [F41.9] 03/24/2016  . Opiate abuse, continuous ([place]) [F11.10] 03/24/2016  . Fibromyalgia [M79.7] 04/17/2015  . Chronic hepatitis C virus infection ([place]) [B18.2] 02/28/2015  . Chronic migraine [G43.709] 07/23/2014  . Substance abuse ([place]) [F19.10] 10/03/2012   Total Time spent with patient: 30 minutes  Past Psychiatric History: Hx. Polysubstance use disorder, PTSD.  Past Medical History:  Past Medical History:  Diagnosis Date  . Anxiety   . Bipolar disorder ([place])   . Compression fracture of spine ([place])   . Fibromyalgia   . Headache   . PTSD (post-traumatic stress disorder)   . Seizures ([place])    Related to benzo withdrawal    Past Surgical History:  Procedure Laterality Date  . HAND SURGERY Right    Family History: History reviewed. No pertinent family history.  Family Psychiatric  History: See H&P.  Social History:  Social History   Substance and Sexual Activity  Alcohol Use Yes   Comment: Daily. Last drink: PTA  from [facility], Drinks beer and liquor.      Social History   Substance and Sexual Activity  Drug Use Yes  . Types: Cocaine, Benzodiazepines, Heroin, Hydrocodone   Comment: Cocaine: last used; Heroin: last used 09/17/2016;     Social History   Socioeconomic History  . Marital status: Single    Spouse name: None  . Number of children: None  . Years of education: None  . Highest education level: None  Social Needs  . Financial resource strain: None  . Food insecurity - worry: None  . Food  insecurity - inability: None  . Transportation needs - medical: None  . Transportation needs - non-medical: None  Occupational History  . None  Tobacco Use  . Smoking status: Current Every Day Smoker    Packs/day: 1.00    Types: Cigarettes  . Smokeless tobacco: Never Used  . Tobacco comment: Pt does not want to stop smoking at this time  Substance and Sexual Activity  . Alcohol use: Yes    Comment: Daily. Last  drink: PTA  from [facility], Drinks beer and liquor.   . Drug use: Yes    Types: Cocaine, Benzodiazepines, Heroin, Hydrocodone    Comment: Cocaine: last used; Heroin: last used 09/17/2016;   . Sexual activity: Yes    Birth control/protection: Injection    Comment: Depo Provera  Other Topics Concern  . None  Social History Narrative  . None   Sleep: Good  Appetite:  Fair  Current Medications: Current Facility-Administered Medications  Medication Dose Route Frequency Provider Last Rate Last Dose  . acetaminophen (TYLENOL) tablet 650 mg  650 mg Oral Q6H PRN Laverle Hobby, PA-C   650 mg at 03/31/17 0816  . alum & mag hydroxide-simeth (MAALOX/MYLANTA) 200-200-20 MG/5ML suspension 30 mL  30 mL Oral Q4H PRN Laverle Hobby, PA-C      . benztropine (COGENTIN) tablet 0.5 mg  0.5 mg Oral BID Patriciaann Clan E, PA-C   0.5 mg at 04/01/17 0758  . chlordiazePOXIDE (LIBRIUM) capsule 25 mg  25 mg Oral Q6H PRN Jenice Leiner I, NP      . chlordiazePOXIDE (LIBRIUM) capsule 25 mg  25 mg Oral TID Lindell Spar I, NP   25 mg at 04/01/17 1159   Followed by  . [START ON 04/02/2017] chlordiazePOXIDE (LIBRIUM) capsule 25 mg  25 mg Oral BH-qamhs Lindell Spar I, NP       Followed by  . [START ON 04/04/2017] chlordiazePOXIDE (LIBRIUM) capsule 25 mg  25 mg Oral Daily Diesel Lina I, NP      . divalproex (DEPAKOTE) DR tablet 500 mg  500 mg Oral Q12H Patriciaann Clan E, PA-C   500 mg at 04/01/17 0759  . gabapentin (NEURONTIN) capsule 600 mg  600 mg Oral [id number] Lindell Spar I, NP   600 mg at 04/01/17 0758  . hydrOXYzine (ATARAX/VISTARIL) tablet 25 mg  25 mg Oral Q6H PRN Lindell Spar I, NP      . loperamide (IMODIUM) capsule 2-4 mg  2-4 mg Oral PRN Hailey Stormer I, NP      . magnesium hydroxide (MILK OF MAGNESIA) suspension 30 mL  30 mL Oral Daily PRN Patriciaann Clan E, PA-C      . methocarbamol (ROBAXIN) tablet 500 mg  500 mg Oral Q8H PRN Patriciaann Clan E, PA-C   500 mg at 04/01/17 0800  . multivitamin with minerals tablet  1 tablet  1 tablet Oral Daily Lindell Spar I, NP   1 tablet at 04/01/17 0759  . naproxen (NAPROSYN) tablet 500 mg  500 mg Oral BID PRN Laverle Hobby, PA-C   500 mg at 04/01/17 1201  . nicotine polacrilex (NICORETTE) gum 2 mg  2 mg Oral PRN Cobos, Myer Peer, MD   2 mg at 04/01/17 0804  . ondansetron (ZOFRAN-ODT) disintegrating tablet 4 mg  4 mg Oral Q6H PRN Lindell Spar I, NP   4 mg at 03/31/17 1211  . prazosin (MINIPRESS) capsule 2 mg  2 mg Oral QHS Pennelope Bracken, MD   2 mg at 03/31/17 2049  .  QUEtiapine (SEROQUEL) tablet 150 mg  150 mg Oral QHS Pennelope Bracken, MD   150 mg at 03/31/17 2048  . QUEtiapine (SEROQUEL) tablet 25 mg  25 mg Oral TID Lindell Spar I, NP   25 mg at 04/01/17 1159  . thiamine (B-1) injection 100 mg  100 mg Intramuscular Once Lindell Spar I, NP      . thiamine (VITAMIN B-1) tablet 100 mg  100 mg Oral Daily Lindell Spar I, NP   100 mg at 04/01/17 0757   Lab Results:  Results for orders placed or performed during the hospital encounter of 03/30/17 (from the past 48 hour(s))  Comprehensive metabolic panel     Status: Abnormal   Collection Time: 03/30/17 10:39 PM  Result Value Ref Range   Sodium 142 135 - 145 mmol/L   Potassium 3.5 3.5 - 5.1 mmol/L   Chloride 112 (H) 101 - 111 mmol/L   CO2 21 (L) 22 - 32 mmol/L   Glucose, Bld 109 (H) 65 - 99 mg/dL   BUN 10 6 - 20 mg/dL   Creatinine, Ser 0.85 0.44 - 1.00 mg/dL   Calcium 8.9 8.9 - 10.3 mg/dL   Total Protein 7.0 6.5 - 8.1 g/dL   Albumin 3.8 3.5 - 5.0 g/dL   AST 14 (L) 15 - 41 U/L   ALT 10 (L) 14 - 54 U/L   Alkaline Phosphatase 42 38 - 126 U/L   Total Bilirubin 0.5 0.3 - 1.2 mg/dL   GFR calc non Af Amer >60 >60 mL/min   GFR calc Af Amer >60 >60 mL/min    Comment: (NOTE) The eGFR has been calculated using the CKD EPI equation. This calculation has not been validated in all clinical situations. eGFR's persistently <60 mL/min signify possible Chronic Kidney Disease.    Anion gap 9 5 - 15     Comment: Performed at [facility], [place] [address], [place], [place] [address]  Ethanol     Status: None   Collection Time: 03/30/17 10:39 PM  Result Value Ref Range   Alcohol, Ethyl (B) <10 <10 mg/dL    Comment:        LOWEST DETECTABLE LIMIT FOR SERUM ALCOHOL IS 10 mg/dL FOR MEDICAL PURPOSES ONLY Performed at [place] [facility] [address] [place], [place] [address]   Salicylate level     Status: None   Collection Time: 03/30/17 10:39 PM  Result Value Ref Range   Salicylate Lvl <9.5 2.8 - 30.0 mg/dL    Comment: Performed at [facility], [place] [address], [place], [place] [address]  Acetaminophen level     Status: Abnormal   Collection Time: 03/30/17 10:39 PM  Result Value Ref Range   Acetaminophen (Tylenol), Serum <10 (L) 10 - 30 ug/mL    Comment:        THERAPEUTIC CONCENTRATIONS VARY SIGNIFICANTLY. A RANGE OF 10-30 ug/mL MAY BE AN EFFECTIVE CONCENTRATION FOR MANY PATIENTS. HOWEVER, SOME ARE BEST TREATED AT CONCENTRATIONS OUTSIDE THIS RANGE. ACETAMINOPHEN CONCENTRATIONS >150 ug/mL AT 4 HOURS AFTER INGESTION AND >50 ug/mL AT 12 HOURS AFTER INGESTION ARE OFTEN ASSOCIATED WITH TOXIC REACTIONS. Performed at [facility], [place] [address] [place], [place] [address]   cbc     Status: Abnormal   Collection Time: 03/30/17 10:39 PM  Result Value Ref Range   WBC 6.9 4.0 - 10.5 K/uL   RBC 5.36 (H) 3.87 - 5.11 MIL/uL   Hemoglobin 15.0 12.0 - 15.0 g/dL  HCT 43.6 36.0 - 46.0 %   MCV 81.3 78.0 - 100.0 fL   MCH 28.0 26.0 - 34.0 pg   MCHC 34.4 30.0 - 36.0 g/dL   RDW 14.4 11.5 - 15.5 %   Platelets 296 150 - 400 K/uL    Comment: Performed at [facility], [place] [address], [place],  [address]  I-Stat beta hCG blood, ED     Status: None   Collection Time: 03/30/17 10:53 PM  Result Value Ref Range   I-stat hCG, quantitative <5.0 <5 mIU/mL   Comment 3            Comment:   GEST. AGE       CONC.  (mIU/mL)   <=1 WEEK        5 - 50     2 WEEKS       50 - 500     3 WEEKS       100 - 10,000     4 WEEKS     1,000 - 30,000        FEMALE AND NON-PREGNANT FEMALE:     LESS THAN 5 mIU/mL    Blood Alcohol level:  Lab Results  Component Value Date   ETH <10 03/30/2017   ETH <10 86/38/1771   Metabolic Disorder Labs: Lab Results  Component Value Date   HGBA1C 5.2 03/24/2016   MPG 103 03/24/2016   Lab Results  Component Value Date   PROLACTIN 54.1 (H) 03/24/2016   Lab Results  Component Value Date   CHOL 164 03/24/2016   TRIG 99 03/24/2016   HDL 47 03/24/2016   CHOLHDL 3.5 03/24/2016   VLDL 20 03/24/2016   LDLCALC 97 03/24/2016   Physical Findings: AIMS: Facial and Oral Movements Muscles of Facial Expression: None, normal Lips and Perioral Area: None, normal Jaw: None, normal Tongue: None, normal,Extremity Movements Upper (arms, wrists, hands, fingers): None, normal Lower (legs, knees, ankles, toes): None, normal, Trunk Movements Neck, shoulders, hips: None, normal, Overall Severity Severity of abnormal movements (highest score from questions above): None, normal Incapacitation due to abnormal movements: None, normal Patient's awareness of abnormal movements (rate only patient's report): No Awareness, Dental Status Current problems with teeth and/or dentures?: No Does patient usually wear dentures?: No  CIWA:  CIWA-Ar Total: 4 COWS:  COWS Total Score: 2  Musculoskeletal: Strength & Muscle Tone: within normal limits Gait & Station: normal Patient leans: N/A  Psychiatric Specialty Exam: Physical Exam  Nursing note and vitals reviewed.   Review of Systems  Constitutional: Negative.   Cardiovascular: Negative.   Gastrointestinal: Negative.   Skin: Negative.   Neurological: Negative.   Psychiatric/Behavioral: Positive for depression and substance abuse (Hx. Polysubstance use disorder). Negative for hallucinations, memory loss and suicidal ideas. The  patient is nervous/anxious and has insomnia.     Blood pressure (!) 86/58, pulse (!) 103, temperature 98.1 F (36.7 C), resp. rate 16, height 5' 1.81" (1.57 m), weight 73 kg (161 lb).Body mass index is 29.63 kg/m.  General Appearance: Casual  Eye Contact: Good  Speech:  Clear and Coherent and Normal Rate  Volume:  Normal  Mood: "I'm still feeling anxious, some depression"  Affect:  Congruent and Constricted  Thought Process:  Coherent and Goal Directed, intact.  Orientation:  Full (Time, Place, and Person)  Thought Content:  Logical, denies any hallucinations, delusions or paranoia.  Suicidal Thoughts: Denies any thoughts, plans or intent.  Homicidal Thoughts: Denies  Memory:  Immediate;  Fair Recent;   Fair Remote;   Fair  Judgement: Fair  Insight:  Lacking  Psychomotor Activity: Some restlessness reported, high anxiety levels.  Concentration:  Concentration: Poor  Recall: [employer] of Knowledge:  Poor  Language:  Good  Akathisia: Negative  Handed:    AIMS (if indicated):     Assets:  Communication Skills Leisure Time Physical Health Resilience  ADL's:  Intact  Cognition:  WNL  Sleep:  Number of Hours: 6.25     Treatment Plan Summary: Daily contact with patient to assess and evaluate symptoms and progress in treatment and Medication management: [name] continue to experience high anxiety levels, cravings & restlessness. Will continue the inpatient hospitalization.  - Will continue today 04/01/2017 plan as below except where it is noted.  Agitation/anxiety.    - Continue Gabapentin 600 mg po tid (see dosing schedules).    - Continue Hydroxyzine 25 mg po prn Q 6 hours  Benzodiazepine detox.    - Will continue the Librium detox protocols.  EPS.    - Continue Cogentin 0.5 mg po bid.  Mood control:    - Continue Seroquel 150 mg po Q qhs   - Continue Seroquel 25 mg po tid for agitation/psychosis.  Mood stabilization.    - Continue Depakote DR 500 mg po Q 12  hours.  PTSD.    - Continue Minipress 2 mg po Q hs.  - Patient to continue to attend & participate in the group sessions.  - Discharge planning will be ongoing  Lindell Spar, NP, PMHNP, FNP-BC. 04/01/2017, 1:21 PMPatient ID: Josie Saunders, female   DOB: 1985/10/22, 32 y.o.   MRN: [id number]

## 2017-04-01 NOTE — Progress Notes (Signed)
Recreation Therapy Notes  Date: 04/01/17 Time: 0930 Location: 300 Hall Dayroom  Group Topic: Stress Management  Goal Area(s) Addresses:  Patient will verbalize importance of using healthy stress management.  Patient will identify positive emotions associated with healthy stress management.   Behavioral Response: Engaged  Intervention: Stress Management  Activity :  Body Scan Meditation.  LRT introduced the stress management technique of meditation.  LRT played a meditation from the Calm app that allowed patients to take inventory of any sensations or tension they may have been experiencing.  Education:  Stress Management, Discharge Planning.   Education Outcome: Acknowledges edcuation/In group clarification offered/Needs additional education  Clinical Observations/Feedback: Pt attended group.    Caroll RancherMarjette Ranell Finelli, LRT/CTRS         Caroll RancherLindsay, Champagne Paletta A 04/01/2017 12:33 PM

## 2017-04-02 DIAGNOSIS — F1320 Sedative, hypnotic or anxiolytic dependence, uncomplicated: Secondary | ICD-10-CM

## 2017-04-02 MED ORDER — HYDROXYZINE HCL 50 MG PO TABS
50.0000 mg | ORAL_TABLET | Freq: Three times a day (TID) | ORAL | Status: DC | PRN
  Administered 2017-04-02 – 2017-04-04 (×5): 50 mg via ORAL
  Filled 2017-04-02 (×4): qty 1
  Filled 2017-04-02: qty 20
  Filled 2017-04-02 (×2): qty 1
  Filled 2017-04-02: qty 20

## 2017-04-02 MED ORDER — QUETIAPINE FUMARATE 50 MG PO TABS
50.0000 mg | ORAL_TABLET | Freq: Three times a day (TID) | ORAL | Status: DC
  Administered 2017-04-02 – 2017-04-04 (×8): 50 mg via ORAL
  Filled 2017-04-02: qty 42
  Filled 2017-04-02 (×2): qty 1
  Filled 2017-04-02: qty 42
  Filled 2017-04-02 (×9): qty 1
  Filled 2017-04-02: qty 42
  Filled 2017-04-02: qty 1

## 2017-04-02 MED ORDER — OLANZAPINE 5 MG PO TBDP
ORAL_TABLET | ORAL | Status: AC
  Administered 2017-04-02: 22:00:00
  Filled 2017-04-02: qty 1

## 2017-04-02 MED ORDER — OLANZAPINE 5 MG PO TBDP
5.0000 mg | ORAL_TABLET | Freq: Once | ORAL | Status: AC
  Filled 2017-04-02: qty 1

## 2017-04-02 MED ORDER — HYDROXYZINE HCL 50 MG PO TABS: 50.0000 mg | ORAL_TABLET | Freq: Four times a day (QID) | ORAL | Status: DC | PRN

## 2017-04-02 MED ORDER — QUETIAPINE FUMARATE 50 MG PO TABS
150.0000 mg | ORAL_TABLET | Freq: Once | ORAL | Status: AC
  Administered 2017-04-02: 150 mg via ORAL
  Filled 2017-04-02: qty 1

## 2017-04-02 NOTE — [facility] Group Notes (Signed)
Orientation / Goals Group  Date:  04/02/2017  Time:  6:42 PM  Type of Therapy:  Nurse Education  / The group focuses on teaching the patients who their staff is, what their staff responsibilities are and what the unit programming / scheduling looks like.   Participation Level:  Active  Participation Quality:  Attentive  Affect:  Appropriate  Cognitive:  Alert  Insight:  Appropriate  Engagement in Group:  Engaged  Modes of Intervention:  Education  Summary of Progress/Problems:  Rich BraveDuke, Taryne Kiger Lynn 04/02/2017, 6:42 PM

## 2017-04-02 NOTE — Progress Notes (Signed)
Patient denies SI, HI and AVH.  Patient attended groups, engaged in unit activities and had no incidents of behavioral dsycontrol.   Assess patient for safety, offer medications as prescribed, engage patient in 1:1 therapeutic discussions.   Patient able to contract for safety. Continue to monitor as planned.  

## 2017-04-02 NOTE — Progress Notes (Signed)
The patient attended the evening A.A.meeting and was appropriate.  

## 2017-04-02 NOTE — B[facility] Group Notes (Signed)
LCSW Group Therapy Note  04/02/2017 9:30-10:30AM - 300 Hall, 10:30-11:30 - 400 Hall, 11:30-12:00 - 500 Hall  Type of Therapy and Topic:  Group Therapy: Anger Cues and Responses  Participation Level:  Active   Description of Group:   In this group, patients learned how to recognize the physical, cognitive, emotional, and behavioral responses they have to anger-provoking situations.  They identified a recent time they became angry and how they reacted.  They analyzed how their reaction was possibly beneficial and how it was possibly unhelpful.  The group discussed a variety of healthier coping skills that could help with such a situation in the future.  Deep breathing was practiced briefly.  Therapeutic Goals: 1. Patients will remember their last incident of anger and how they felt emotionally and physically, what their thoughts were at the time, and how they behaved. 2. Patients will identify how their behavior at that time worked for them, as well as how it worked against them. 3. Patients will explore possible new behaviors to use in future anger situations. 4. Patients will learn that anger itself is normal and cannot be eliminated, and that healthier reactions can assist with resolving conflict rather than worsening situations.  Summary of Patient Progress:  The patient shared that their most recent time of anger was a few days before admission to the hospital and this was because she was mad at her family, told them to "shut up" and got very verbally aggressive with them.  She talked throughout group even when she closed her eyes and appeared to go to sleep.  Therapeutic Modalities:   Cognitive Behavioral Therapy  Lynnell ChadMareida J Grossman-Orr  04/02/2017 8:28 AM

## 2017-04-02 NOTE — Progress Notes (Addendum)
[facility]  04/02/2017 9:34 AM Kathy Gallantadia Stankey  MRN:  [id number]  Subjective: Kathy [name] reports, "I am very anxious. I slept OK but just anxious, is there anything I can take for it."  Objective: Kathy Gallantadia Romanoski is a 32 y/o F with history of PTSD and polysubstance abuse who was admitted voluntarily with worsening depression, anxiety, and relapse of substance use. Pt had been off of her psychotropic medications for 2 months. Pt has relevant [facility] and [facility], but she only stayed for 2 weeks. Pt then was in the [place] area with her mother and had relapse of substance use and worsening of mood symptoms.  Today, 04-02-17,  Kathy [name] is seen, chart reviewed. She is visible on the unit, attending group sessions & activities. [name] reports a 7/10 anxiety level, discussed medication adjustments.  Increased her Seroquel 25 mg TID to 50 mg to assist her anxiety along with increasing her Vistaril 25 mg every six hours PRN anxiety to 50 mg.  Her Librium benzodiazepine taper contines without issue.  Her appetite is "alright", mood "stable."  Hoping to get into [facility] on Tuesday.  She does feel "rested" after sleep today. She denies any SIHI, AVH, delusional thoughts or paranoia. She does not appear to be responding to any internal stimuli.  Principal Problem: PTSD (post-traumatic stress disorder)  Diagnosis:   Patient Active Problem List   Diagnosis Date Noted  . Polysubstance dependence including opioid type drug, continuous use (HCC) [F11.20, F19.20] 11/30/2016    Priority: High  . PTSD (post-traumatic stress disorder) [F43.10] 05/10/2013    Priority: High  . Polysubstance (excluding opioids) dependence, daily use (HCC) [F19.20] 11/30/2016  . History of 3 spontaneous abortions [Z87.42] 03/24/2016  . Eczema [L30.9] 03/24/2016  . Depression [F32.9] 03/24/2016  . Asthma without status asthmaticus without complication [J45.909]  03/24/2016  . Anxiety [F41.9] 03/24/2016  . Opiate abuse, continuous (HCC) [F11.10] 03/24/2016  . Fibromyalgia [M79.7] 04/17/2015  . Chronic hepatitis C virus infection (HCC) [B18.2] 02/28/2015  . Chronic migraine [G43.709] 07/23/2014  . Bipolar disorder (HCC) [F31.9] 05/10/2013  . Substance abuse (HCC) [F19.10] 10/03/2012   Total Time spent with patient: 30 minutes  Past Psychiatric History: Hx. Polysubstance use disorder, PTSD.  Past Medical History:  Past Medical History:  Diagnosis Date  . Anxiety   . Bipolar disorder (HCC)   . Compression fracture of spine (HCC)   . Fibromyalgia   . Headache   . PTSD (post-traumatic stress disorder)   . Seizures (HCC)    Related to benzo withdrawal    Past Surgical History:  Procedure Laterality Date  . HAND SURGERY Right    Family History: History reviewed. No pertinent family history.  Family Psychiatric  History: See H&P.  Social History:  Social History   Substance and Sexual Activity  Alcohol Use Yes   Comment: Daily. [facility], Drinks beer and liquor.      Social History   Substance and Sexual Activity  Drug Use Yes  . Types: Cocaine, Benzodiazepines, Heroin, Hydrocodone   Comment: Cocaine: last used; Heroin: last used 09/17/2016;     Social History   Socioeconomic History  . Marital status: Single    Spouse name: None  . Number of children: None  . Years of education: None  . Highest education level: None  Social Needs  . Financial resource strain: None  . Food insecurity - worry: None  .  Food insecurity - inability: None  . Transportation needs - medical: None  . Transportation needs - non-medical: None  Occupational History  . None  Tobacco Use  . Smoking status: Current Every Day Smoker    Packs/day: 1.00    Types: Cigarettes  . Smokeless tobacco: Never Used  . Tobacco comment: Pt does not want to stop smoking at this time  Substance and Sexual Activity  . Alcohol use: Yes     Comment: Daily. Last drink: PTA  from [facility], Drinks beer and liquor.   . Drug use: Yes    Types: Cocaine, Benzodiazepines, Heroin, Hydrocodone    Comment: Cocaine: last used; Heroin: last used 09/17/2016;   . Sexual activity: Yes    Birth control/protection: Injection    Comment: Depo Provera  Other Topics Concern  . None  Social History Narrative  . None   Sleep: Good  Appetite:  Fair  Current Medications: Current Facility-Administered Medications  Medication Dose Route Frequency Provider Last Rate Last Dose  . acetaminophen (TYLENOL) tablet 650 mg  650 mg Oral Q6H PRN Kerry Hough, PA-C   650 mg at 04/02/17 0513  . alum & mag hydroxide-simeth (MAALOX/MYLANTA) 200-200-20 MG/5ML suspension 30 mL  30 mL Oral Q4H PRN Kerry Hough, PA-C      . benztropine (COGENTIN) tablet 0.5 mg  0.5 mg Oral BID Kerry Hough, PA-C   0.5 mg at 04/02/17 0803  . chlordiazePOXIDE (LIBRIUM) capsule 25 mg  25 mg Oral Q6H PRN Armandina Stammer I, NP   25 mg at 04/01/17 2100  . chlordiazePOXIDE (LIBRIUM) capsule 25 mg  25 mg Oral BH-qamhs Nwoko, Agnes I, NP       Followed by  . [START ON 04/04/2017] chlordiazePOXIDE (LIBRIUM) capsule 25 mg  25 mg Oral Daily Nwoko, Agnes I, NP      . divalproex (DEPAKOTE) DR tablet 500 mg  500 mg Oral Q12H Donell Sievert E, PA-C   500 mg at 04/02/17 4098  . gabapentin (NEURONTIN) capsule 600 mg  600 mg Oral [id number] Armandina Stammer I, NP   600 mg at 04/02/17 0803  . hydrOXYzine (ATARAX/VISTARIL) tablet 50 mg  50 mg Oral Q6H PRN Charm Rings, NP      . loperamide (IMODIUM) capsule 2-4 mg  2-4 mg Oral PRN Armandina Stammer I, NP      . magnesium hydroxide (MILK OF MAGNESIA) suspension 30 mL  30 mL Oral Daily PRN Kerry Hough, PA-C      . methocarbamol (ROBAXIN) tablet 500 mg  500 mg Oral Q8H PRN Kerry Hough, PA-C   500 mg at 04/02/17 1191  . multivitamin with minerals tablet 1 tablet  1 tablet Oral Daily Armandina Stammer I, NP   1 tablet at 04/02/17 4782  . naproxen  (NAPROSYN) tablet 500 mg  500 mg Oral BID PRN Kerry Hough, PA-C   500 mg at 04/01/17 1201  . nicotine polacrilex (NICORETTE) gum 2 mg  2 mg Oral PRN Cobos, Rockey Situ, MD   2 mg at 04/02/17 0849  . ondansetron (ZOFRAN-ODT) disintegrating tablet 4 mg  4 mg Oral Q6H PRN Armandina Stammer I, NP   4 mg at 03/31/17 1211  . prazosin (MINIPRESS) capsule 2 mg  2 mg Oral QHS Micheal Likens, MD   2 mg at 04/01/17 2151  . QUEtiapine (SEROQUEL) tablet 150 mg  150 mg Oral QHS Micheal Likens, MD   150 mg at 04/01/17 2151  . QUEtiapine (  SEROQUEL) tablet 50 mg  50 mg Oral TID Charm Rings, NP      . thiamine (B-1) injection 100 mg  100 mg Intramuscular Once Armandina Stammer I, NP      . thiamine (VITAMIN B-1) tablet 100 mg  100 mg Oral Daily Armandina Stammer I, NP   100 mg at 04/02/17 0803   Lab Results:  No results found for this or any previous visit (from the past 48 hour(s)). Blood Alcohol level:  Lab Results  Component Value Date   ETH <10 03/30/2017   ETH <10 11/29/2016   Metabolic Disorder Labs: Lab Results  Component Value Date   HGBA1C 5.2 03/24/2016   MPG 103 03/24/2016   Lab Results  Component Value Date   PROLACTIN 54.1 (H) 03/24/2016   Lab Results  Component Value Date   CHOL 164 03/24/2016   TRIG 99 03/24/2016   HDL 47 03/24/2016   CHOLHDL 3.5 03/24/2016   VLDL 20 03/24/2016   LDLCALC 97 03/24/2016   Physical Findings: AIMS: Facial and Oral Movements Muscles of Facial Expression: None, normal Lips and Perioral Area: None, normal Jaw: None, normal Tongue: None, normal,Extremity Movements Upper (arms, wrists, hands, fingers): None, normal Lower (legs, knees, ankles, toes): None, normal, Trunk Movements Neck, shoulders, hips: None, normal, Overall Severity Severity of abnormal movements (highest score from questions above): None, normal Incapacitation due to abnormal movements: None, normal Patient's awareness of abnormal movements (rate only patient's  report): No Awareness, Dental Status Current problems with teeth and/or dentures?: No Does patient usually wear dentures?: No  CIWA:  CIWA-Ar Total: 7 COWS:  COWS Total Score: 0  Musculoskeletal: Strength & Muscle Tone: within normal limits Gait & Station: normal Patient leans: N/A  Psychiatric Specialty Exam: Physical Exam  Nursing note and vitals reviewed. Constitutional: She is oriented to person, place, and time. She appears well-developed and well-nourished.  HENT:  Head: Normocephalic.  Neck: Normal range of motion.  Respiratory: Effort normal.  Musculoskeletal: Normal range of motion.  Neurological: She is alert and oriented to person, place, and time.  Psychiatric: Her speech is normal and behavior is normal. Judgment and thought content normal. Her mood appears anxious. Cognition and memory are normal.    Review of Systems  Constitutional: Negative.   Cardiovascular: Negative.   Gastrointestinal: Negative.   Skin: Negative.   Neurological: Negative.   Psychiatric/Behavioral: Positive for depression and substance abuse (Hx. Polysubstance use disorder). Negative for hallucinations, memory loss and suicidal ideas. The patient is nervous/anxious and has insomnia.     Blood pressure 119/63, pulse 75, temperature 98.1 F (36.7 C), resp. rate 16, height 5' 1.81" (1.57 m), weight 73 kg (161 lb).Body mass index is 29.63 kg/m.  General Appearance: Casual  Eye Contact: Good  Speech:  Clear and Coherent and Normal Rate  Volume:  Normal  Mood: "My anxiety is high"  Affect:  Congruent and Constricted  Thought Process:  Coherent and Goal Directed, intact.  Orientation:  Full (Time, Place, and Person)  Thought Content:  Logical, denies any hallucinations, delusions or paranoia.  Suicidal Thoughts: Denies any thoughts, plans or intent.  Homicidal Thoughts: Denies  Memory:  Immediate;   Fair Recent;   Fair Remote;   Fair  Judgement: Fair  Insight:  Lacking  Psychomotor  Activity: Some restlessness reported, high anxiety levels.  Concentration:  Concentration: Poor  Recall: [employer] of Knowledge:  Poor  Language:  Good  Akathisia: Negative  Handed:    AIMS (if indicated):  Assets:  Communication Skills Leisure Time Physical Health Resilience  ADL's:  Intact  Cognition:  WNL  Sleep:  Number of Hours: 6.25     Treatment Plan Summary: Daily contact with patient to assess and evaluate symptoms and progress in treatment and Medication management: Kathy [name] continue to experience high anxiety levels, cravings & restlessness. Will continue the inpatient hospitalization.  - Will continue today 04/02/2017 plan as below except where it is noted.  Agitation/anxiety.    - Continue Gabapentin 600 mg po tid (see dosing schedules).    - Increased Hydroxyzine 25 mg every six hours PRN to 50 mg TID PRN  Benzodiazepine detox.    - Will continue the Librium detox protocols.  EPS.    - Continue Cogentin 0.5 mg po bid.  Mood control:    - Continue Seroquel 150 mg po Q qhs   - Increased Seroquel 25 mg po tid to 50 mg for agitation/psychosis/anxiety  Mood stabilization.    - Continue Depakote DR 500 mg po Q 12 hours.  PTSD.    - Continue Minipress 2 mg po Q hs.  - Patient to continue to attend & participate in the group sessions.  - Discharge planning will be ongoing  Nanine Means, NP, PMHNP, FNP-BC. 04/02/2017, 9:34 AMPatient ID: Kathy [name], female   DOB: [birth year], 32 y.o.   MRN: [id number]  Patient ID: Jullia Mulligan, female   DOB: 02/14/1986, 32 y.o.   MRN: [id number]

## 2017-04-03 MED ORDER — BISACODYL 5 MG PO TBEC
5.0000 mg | DELAYED_RELEASE_TABLET | Freq: Every day | ORAL | Status: DC | PRN
  Administered 2017-04-03: 5 mg via ORAL
  Filled 2017-04-03: qty 1

## 2017-04-03 MED ORDER — OLANZAPINE 5 MG PO TBDP
5.0000 mg | ORAL_TABLET | Freq: Once | ORAL | Status: AC
  Administered 2017-04-03: 5 mg via ORAL

## 2017-04-03 MED ORDER — PROPRANOLOL HCL 10 MG PO TABS
10.0000 mg | ORAL_TABLET | Freq: Two times a day (BID) | ORAL | Status: DC
  Administered 2017-04-03 – 2017-04-04 (×3): 10 mg via ORAL
  Filled 2017-04-03 (×5): qty 1

## 2017-04-03 MED ORDER — QUETIAPINE FUMARATE 300 MG PO TABS
300.0000 mg | ORAL_TABLET | Freq: Every day | ORAL | Status: DC
  Administered 2017-04-03 – 2017-04-04 (×2): 300 mg via ORAL
  Filled 2017-04-03: qty 1
  Filled 2017-04-03: qty 14
  Filled 2017-04-03 (×2): qty 1

## 2017-04-03 MED ORDER — OLANZAPINE 5 MG PO TBDP
ORAL_TABLET | ORAL | Status: AC
  Filled 2017-04-03: qty 1

## 2017-04-03 MED ORDER — PROPRANOLOL HCL 10 MG PO TABS
ORAL_TABLET | ORAL | Status: AC
  Filled 2017-04-03: qty 1

## 2017-04-03 NOTE — Progress Notes (Signed)
Patient ID: Kathy [name], female   DOB: [birth year], 32 y.o.   MRN: [id number]  [facility] MD Progress Note  04/03/2017 10:02 AM Kathy [name]  MRN:  [id number]  Subjective: Kathy [name] reports, "I am still anxious."  Objective: Kathy [name] is a 32 y/o F with history of PTSD and polysubstance abuse who was admitted voluntarily with worsening depression, anxiety, and relapse of substance use. Pt had been off of her psychotropic medications for 2 months.   Today, 04-03-17,   Patient was assessed yesterday per this provider and her Seroquel was discussed. She choose to have a daytime increase instead of a nighttime does increase as educated her about increasing this medication slowly and she had not had it for 2 months prior to admission. She also presented yesterday in a groggy state with no sign of anxiety noted.  I also increased her Vistaril yesterday and encouraged her to use it. She does not require two antipsychotics, not recommended per APA, nor does she warrant it. Manipulation by this patient with medication seeking behaviors. I met with her with her nurse today to make sure all communication was clear. Propranolol was started and explained that she will have some anxiety and should use her coping skills as a pill was not going to resolve everything. She appears to want to dictate her medication regiment and this is not the protocol. 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  No signs or detox noted.    Principal Problem: PTSD (post-traumatic stress disorder)  Diagnosis:   Patient Active Problem List   Diagnosis Date Noted  . Polysubstance dependence including opioid type drug, continuous use ([place]) [F11.20, F19.20] 11/30/2016    Priority: High  . PTSD (post-traumatic stress disorder) [F43.10] 05/10/2013    Priority: High  . Polysubstance (excluding opioids) dependence, daily use ([place]) [F19.20] 11/30/2016  . History of 3  spontaneous abortions [Z87.42] 03/24/2016  . Eczema [L30.9] 03/24/2016  . Depression [F32.9] 03/24/2016  . Asthma without status asthmaticus without complication [D22.025] 42/70/6237  . Anxiety [F41.9] 03/24/2016  . Opiate abuse, continuous ([place]) [F11.10] 03/24/2016  . Fibromyalgia [M79.7] 04/17/2015  . Chronic hepatitis C virus infection ([place]) [B18.2] 02/28/2015  . Chronic migraine [G43.709] 07/23/2014  . Bipolar disorder ([place]) [F31.9] 05/10/2013  . Substance abuse ([place]) [F19.10] 10/03/2012   Total Time spent with patient: 30 minutes  Past Psychiatric History: Hx. Polysubstance use disorder, PTSD.  Past Medical History:  Past Medical History:  Diagnosis Date  . Anxiety   . Bipolar disorder ([place])   . Compression fracture of spine ([place])   . Fibromyalgia   . Headache   . PTSD (post-traumatic stress disorder)   . Seizures ([place])    Related to benzo withdrawal    Past Surgical History:  Procedure Laterality Date  . HAND SURGERY Right    Family History: History reviewed. No pertinent family history.  Family Psychiatric  History: See H&P.  Social History:  Social History   Substance and Sexual Activity  Alcohol Use Yes   Comment: Daily. Last drink: PTA  from [facility], Drinks beer and liquor.      Social History   Substance and Sexual Activity  Drug Use Yes  . Types: Cocaine, Benzodiazepines, Heroin, Hydrocodone   Comment: Cocaine: last used; Heroin: last used 09/17/2016;     Social History   Socioeconomic History  . Marital status: Single    Spouse name: None  . Number of  children: None  . Years of education: None  . Highest education level: None  Social Needs  . Financial resource strain: None  . Food insecurity - worry: None  . Food insecurity - inability: None  . Transportation needs - medical: None  . Transportation needs - non-medical: None  Occupational History  . None  Tobacco Use  . Smoking status: Current Every Day Smoker    Packs/day: 1.00    Types:  Cigarettes  . Smokeless tobacco: Never Used  . Tobacco comment: Pt does not want to stop smoking at this time  Substance and Sexual Activity  . Alcohol use: Yes    Comment: Daily. Last drink: PTA  from [facility], Drinks beer and liquor.   . Drug use: Yes    Types: Cocaine, Benzodiazepines, Heroin, Hydrocodone    Comment: Cocaine: last used; Heroin: last used 09/17/2016;   . Sexual activity: Yes    Birth control/protection: Injection    Comment: Depo Provera  Other Topics Concern  . None  Social History Narrative  . None   Sleep: Good  Appetite:  Fair  Current Medications: Current Facility-Administered Medications  Medication Dose Route Frequency Provider Last Rate Last Dose  . acetaminophen (TYLENOL) tablet 650 mg  650 mg Oral Q6H PRN Laverle Hobby, PA-C   650 mg at 04/03/17 0735  . alum & mag hydroxide-simeth (MAALOX/MYLANTA) 200-200-20 MG/5ML suspension 30 mL  30 mL Oral Q4H PRN Laverle Hobby, PA-C      . benztropine (COGENTIN) tablet 0.5 mg  0.5 mg Oral BID Patriciaann Clan E, PA-C   0.5 mg at 04/03/17 0736  . chlordiazePOXIDE (LIBRIUM) capsule 25 mg  25 mg Oral Q6H PRN Lindell Spar I, NP   25 mg at 04/03/17 0736  . [START ON 04/04/2017] chlordiazePOXIDE (LIBRIUM) capsule 25 mg  25 mg Oral Daily Nwoko, Agnes I, NP      . divalproex (DEPAKOTE) DR tablet 500 mg  500 mg Oral Q12H Patriciaann Clan E, PA-C   500 mg at 04/03/17 0736  . gabapentin (NEURONTIN) capsule 600 mg  600 mg Oral [id number] Nwoko, Herbert Pun I, NP   600 mg at 04/03/17 0736  . hydrOXYzine (ATARAX/VISTARIL) tablet 50 mg  50 mg Oral TID PRN Patrecia Pour, NP   50 mg at 04/02/17 1204  . loperamide (IMODIUM) capsule 2-4 mg  2-4 mg Oral PRN Nwoko, Agnes I, NP      . magnesium hydroxide (MILK OF MAGNESIA) suspension 30 mL  30 mL Oral Daily PRN Patriciaann Clan E, PA-C      . methocarbamol (ROBAXIN) tablet 500 mg  500 mg Oral Q8H PRN Patriciaann Clan E, PA-C   500 mg at 04/03/17 7262  . multivitamin with minerals tablet 1 tablet  1  tablet Oral Daily Lindell Spar I, NP   1 tablet at 04/03/17 0736  . naproxen (NAPROSYN) tablet 500 mg  500 mg Oral BID PRN Laverle Hobby, PA-C   500 mg at 04/03/17 0355  . nicotine polacrilex (NICORETTE) gum 2 mg  2 mg Oral PRN Cobos, Myer Peer, MD   2 mg at 04/03/17 0737  . ondansetron (ZOFRAN-ODT) disintegrating tablet 4 mg  4 mg Oral Q6H PRN Lindell Spar I, NP   4 mg at 04/03/17 0734  . prazosin (MINIPRESS) capsule 2 mg  2 mg Oral QHS Pennelope Bracken, MD   2 mg at 04/02/17 2135  . propranolol (INDERAL) tablet 10 mg  10 mg Oral BID Patrecia Pour,  NP      . QUEtiapine (SEROQUEL) tablet 300 mg  300 mg Oral QHS Patrecia Pour, NP      . QUEtiapine (SEROQUEL) tablet 50 mg  50 mg Oral TID Patrecia Pour, NP   50 mg at 04/03/17 0734  . thiamine (B-1) injection 100 mg  100 mg Intramuscular Once Nwoko, Herbert Pun I, NP      . thiamine (VITAMIN B-1) tablet 100 mg  100 mg Oral Daily Nwoko, Agnes I, NP   100 mg at 04/03/17 8676   Lab Results:  No results found for this or any previous visit (from the past 48 hour(s)). Blood Alcohol level:  Lab Results  Component Value Date   ETH <10 03/30/2017   ETH <10 19/50/9326   Metabolic Disorder Labs: Lab Results  Component Value Date   HGBA1C 5.2 03/24/2016   MPG 103 03/24/2016   Lab Results  Component Value Date   PROLACTIN 54.1 (H) 03/24/2016   Lab Results  Component Value Date   CHOL 164 03/24/2016   TRIG 99 03/24/2016   HDL 47 03/24/2016   CHOLHDL 3.5 03/24/2016   VLDL 20 03/24/2016   LDLCALC 97 03/24/2016   Physical Findings: AIMS: Facial and Oral Movements Muscles of Facial Expression: None, normal Lips and Perioral Area: None, normal Jaw: None, normal Tongue: None, normal,Extremity Movements Upper (arms, wrists, hands, fingers): None, normal Lower (legs, knees, ankles, toes): None, normal, Trunk Movements Neck, shoulders, hips: None, normal, Overall Severity Severity of abnormal movements (highest score from questions  above): None, normal Incapacitation due to abnormal movements: None, normal Patient's awareness of abnormal movements (rate only patient's report): No Awareness, Dental Status Current problems with teeth and/or dentures?: No Does patient usually wear dentures?: No  CIWA:  CIWA-Ar Total: 1 COWS:  COWS Total Score: 13  Musculoskeletal: Strength & Muscle Tone: within normal limits Gait & Station: normal Patient leans: N/A  Psychiatric Specialty Exam: Physical Exam  Nursing note and vitals reviewed. Constitutional: She is oriented to person, place, and time. She appears well-developed and well-nourished.  HENT:  Head: Normocephalic.  Neck: Normal range of motion.  Respiratory: Effort normal.  Musculoskeletal: Normal range of motion.  Neurological: She is alert and oriented to person, place, and time.  Psychiatric: Her speech is normal and behavior is normal. Judgment and thought content normal. Her mood appears anxious. Cognition and memory are normal.    Review of Systems  Constitutional: Negative.   Cardiovascular: Negative.   Gastrointestinal: Negative.   Skin: Negative.   Neurological: Negative.   Psychiatric/Behavioral: Positive for depression and substance abuse (Hx. Polysubstance use disorder). Negative for hallucinations, memory loss and suicidal ideas. The patient is nervous/anxious and has insomnia.     Blood pressure 106/78, pulse (!) 102, temperature 98.2 F (36.8 C), temperature source Oral, resp. rate 18, height 5' 1.81" (1.57 m), weight 73 kg (161 lb).Body mass index is 29.63 kg/m.  General Appearance: Casual  Eye Contact: Good  Speech:  Clear and Coherent and Normal Rate  Volume:  Normal  Mood: "My anxiety is high"  Affect:  Congruent and Constricted  Thought Process:  Coherent and Goal Directed, intact.  Orientation:  Full (Time, Place, and Person)  Thought Content:  Logical, denies any hallucinations, delusions or paranoia.  Suicidal Thoughts: Denies any  thoughts, plans or intent.  Homicidal Thoughts: Denies  Memory:  Immediate;   Fair Recent;   Fair Remote;   Fair  Judgement: Fair  Insight:  Lacking  Psychomotor Activity:  Some restlessness reported, high anxiety levels.  Concentration:  Concentration: Poor  Recall: [employer] of Knowledge:  Poor  Language:  Good  Akathisia: Negative  Handed:    AIMS (if indicated):     Assets:  Communication Skills Leisure Time Physical Health Resilience  ADL's:  Intact  Cognition:  WNL  Sleep:  Number of Hours: 6.25     Treatment Plan Summary: Daily contact with patient to assess and evaluate symptoms and progress in treatment and Medication management: Kathy [name] continue to experience high anxiety levels, cravings & restlessness. Will continue the inpatient hospitalization.  - Will continue today 04/03/2017 plan as below except where it is noted.  Agitation/anxiety.    - Continue Gabapentin 600 mg po tid (see dosing schedules).    - Increased Hydroxyzine 25 mg every six hours PRN to 50 mg TID PRN  Benzodiazepine detox.    - Will continue the Librium detox protocols.  EPS.    - Continue Cogentin 0.5 mg po bid.  Mood control:    - Continue Seroquel 300 mg po at bedtime as the on-call provider started it   - Increased Seroquel 25 mg po tid to 50 mg for agitation/psychosis/anxiety -Propranolol 10 mg BID for anxiety started  Mood stabilization.    - Continue Depakote DR 500 mg po Q 12 hours.  PTSD.    - Continue Minipress 2 mg po Q hs.  - Patient to continue to attend & participate in the group sessions.  - Discharge planning will be ongoing  Waylan Boga, NP, PMHNP, FNP-BC. 04/03/2017, 10:02 AMPatient ID: Josie Saunders, female   DOB: Sep 08, 1985, 32 y.o.   MRN: [id number]  Patient ID: Laretha Luepke, female   DOB: [birth year], 32 y.o.   MRN: [id number]

## 2017-04-03 NOTE — Progress Notes (Signed)
Patient ID: Kathy [name], female   DOB: Jul 09, 1985, 32 y.o.   MRN: [id number]   D: Pt has been very anxious, angry, and agitated on the unit today. Pt reported that nobody was doing anything for her and [facility] Pt reported that she needed medication, pt was given prn medication she reported that it somewhat helped. Pt was seen by Catha NottinghamJamison NP, new orders were noted for medication. Pt reported that her depression was a 8, her hopelessness was a 10, and her anxiety was a 10. Pt reported that her goal for today was to get into [facility]. Pt reported being negative SI/HI, no AH/VH noted. A: 15 min checks continued for patient safety. R: Pt safety maintained.

## 2017-04-03 NOTE — [facility] Group Notes (Signed)
[facility] Group Notes:  (Nursing/MHT/Case Management/Adjunct)  Date:  04/03/2017  Time:  1:47 PM  Type of Therapy:  Psychoeducational Skills  Participation Level:  Active  Participation Quality:  Appropriate  Affect:  Appropriate  Cognitive:  Appropriate  Insight:  Appropriate  Engagement in Group:  Engaged  Modes of Intervention:  Problem-solving  Summary of Progress/Problems: Pt attended Psychoeducational group with top topic healthy support systems.   Kathy [name] 04/03/2017, 1:47 PM 

## 2017-04-03 NOTE — Progress Notes (Signed)
Patient did attend the evening speaker AA meeting.  

## 2017-04-03 NOTE — Progress Notes (Signed)
D.  Pt anxious on approach, became extremely agitated about her belongings and what she could not take back on unit.  Pt was particularly upset about the makeup she could not have due to the container.  Pt was cursing loudly in hall and at staff.  Pt requested zyprexa zydis to calm down. Pt also requested her night medications at that time.  Spoke with NP and received one time order for 5 mg of Zyprexa Zydis for Pt and she calmed down at that time.  Pt also requested her night time Seroquel to be increased to 300 mg.  Pt states she takes that much at home.  This was verified by her home medications but she had not taken those in a month before admission.  The scheduled dose of Seroquel had little effect on Pt so NP did order the 300 mg to be given tonight with the doctor to re-evaluate tomorrow.  Pt was pleased with this.  Pt did attend evening AA group, observed in dayroom interacting with peers until dayroom closed.  Pt denies SI/HI/AVH at this time.  A.  Support and encouragement offered, medication given as ordered  R.  Pt remains safe on the unit, will continue to monitor.

## 2017-04-03 NOTE — [facility] Group Notes (Signed)
[facility] Group Notes:  (Nursing/MHT/Case Management/Adjunct)  Date:  04/03/2017  Time:  8:53 AM  Type of Therapy:  Goals/Orientation Group.  Participation Level:  Active  Participation Quality:  Appropriate  Affect:  Appropriate  Cognitive:  Appropriate  Insight:  Appropriate  Engagement in Group:  Engaged  Modes of Intervention:  Discussion  Summary of Progress/Problems: Pt attended goals/orientation group, pt was receptive.   Jacquelyne BalintForrest, Sharmayne Jablon Shanta 04/03/2017, 8:53 AM

## 2017-04-03 NOTE — [facility] Group Notes (Signed)
[facility] LCSW Group Therapy Note  Date/Time:  04/03/2017 9-10am  Type of Therapy and Topic:  Group Therapy:  Healthy and Unhealthy Supports  Participation Level:  Minimal   Description of Group:  Patients in this group were introduced to the idea of adding a variety of healthy supports to address the various needs in their lives.Patients discussed what additional healthy supports could be helpful in their recovery and wellness after discharge in order to prevent future hospitalizations.   An emphasis was placed on using counselor, doctor, therapy groups, 12-step groups, and problem-specific support groups to expand supports.  They also worked as a group on developing a specific plan for several patients to deal with unhealthy supports through boundary-setting, psychoeducation with loved ones, and even termination of relationships.   Therapeutic Goals:   1)  discuss importance of adding supports to stay well once out of the hospital  2)  compare healthy versus unhealthy supports and identify some examples of each  3)  generate ideas and descriptions of healthy supports that can be added  4)  offer mutual support about how to address unhealthy supports  5)  encourage active participation in and adherence to discharge plan    Summary of Patient Progress:  The patient expressed a willingness to add nothing to help in her recovery journey.  She said her mother and father are supportive, and her mother goes with her to the doctor, is very helpful.   Therapeutic Modalities:   Motivational Interviewing Brief Solution-Focused Therapy  Ambrose MantleMareida Grossman-Orr, LCSW

## 2017-04-04 MED ORDER — BENZTROPINE MESYLATE 0.5 MG PO TABS: 0.5000 mg | ORAL_TABLET | Freq: Two times a day (BID) | ORAL | 0 refills | Status: DC

## 2017-04-04 MED ORDER — PRAZOSIN HCL 2 MG PO CAPS: 2.0000 mg | ORAL_CAPSULE | Freq: Every day | ORAL | 0 refills | Status: DC

## 2017-04-04 MED ORDER — QUETIAPINE FUMARATE 300 MG PO TABS: 300.0000 mg | ORAL_TABLET | Freq: Every day | ORAL | 0 refills | Status: DC

## 2017-04-04 MED ORDER — PRAZOSIN HCL 1 MG PO CAPS
3.0000 mg | ORAL_CAPSULE | Freq: Every day | ORAL | Status: DC
  Administered 2017-04-04: 3 mg via ORAL
  Filled 2017-04-04 (×2): qty 3

## 2017-04-04 MED ORDER — OLANZAPINE 5 MG PO TBDP
ORAL_TABLET | ORAL | Status: AC
  Filled 2017-04-04: qty 1

## 2017-04-04 MED ORDER — PRAZOSIN HCL 1 MG PO CAPS: 3.0000 mg | ORAL_CAPSULE | Freq: Every day | ORAL | Status: DC

## 2017-04-04 MED ORDER — OLANZAPINE 5 MG PO TBDP
5.0000 mg | ORAL_TABLET | Freq: Once | ORAL | Status: AC
  Administered 2017-04-04: 5 mg via ORAL
  Filled 2017-04-04 (×2): qty 1

## 2017-04-04 MED ORDER — PROPRANOLOL HCL 20 MG PO TABS: 20.0000 mg | ORAL_TABLET | Freq: Two times a day (BID) | ORAL | 0 refills | Status: DC

## 2017-04-04 MED ORDER — QUETIAPINE FUMARATE 50 MG PO TABS: 50.0000 mg | ORAL_TABLET | Freq: Three times a day (TID) | ORAL | 0 refills | Status: DC

## 2017-04-04 MED ORDER — PROPRANOLOL HCL 10 MG PO TABS: 10.0000 mg | ORAL_TABLET | Freq: Two times a day (BID) | ORAL | 0 refills | Status: DC

## 2017-04-04 MED ORDER — PRAZOSIN HCL 2 MG PO CAPS
3.0000 mg | ORAL_CAPSULE | Freq: Every day | ORAL | Status: DC
  Filled 2017-04-04 (×2): qty 1

## 2017-04-04 MED ORDER — PRAZOSIN HCL 1 MG PO CAPS: 3.0000 mg | ORAL_CAPSULE | Freq: Every day | ORAL | 0 refills | Status: DC

## 2017-04-04 MED ORDER — HYDROXYZINE HCL 50 MG PO TABS: ORAL_TABLET | ORAL | 0 refills | Status: DC

## 2017-04-04 MED ORDER — PROPRANOLOL HCL 20 MG PO TABS
20.0000 mg | ORAL_TABLET | Freq: Two times a day (BID) | ORAL | Status: DC
  Administered 2017-04-04: 20 mg via ORAL
  Filled 2017-04-04 (×3): qty 1
  Filled 2017-04-04 (×2): qty 28
  Filled 2017-04-04: qty 1

## 2017-04-04 MED ORDER — GABAPENTIN 300 MG PO CAPS: 600.0000 mg | ORAL_CAPSULE | ORAL | 0 refills | Status: DC

## 2017-04-04 MED ORDER — NICOTINE POLACRILEX 2 MG MT GUM: 2.0000 mg | CHEWING_GUM | OROMUCOSAL | 0 refills | Status: DC | PRN

## 2017-04-04 MED ORDER — MAGNESIUM CITRATE PO SOLN
1.0000 | Freq: Once | ORAL | Status: AC
  Administered 2017-04-04: 1 via ORAL

## 2017-04-04 MED ORDER — DIVALPROEX SODIUM 500 MG PO DR TAB: 500.0000 mg | DELAYED_RELEASE_TABLET | Freq: Two times a day (BID) | ORAL | 0 refills | Status: DC

## 2017-04-04 NOTE — Progress Notes (Signed)
DAR NOTE: Patient presents with anxious affect and labile mood.  Pt has been constantly asking for medications and complaining that her medications are right. Pt complained of constipation and states she has not been going for 5 days now. Magnesium citrate solution ordered. Denies pain, auditory and visual hallucinations.  Rates depression at 8, hopelessness at 10, and anxiety at 10.  Maintained on routine safety checks.  Medications given as prescribed.  Support and encouragement offered as needed.  Attended group and participated.  States goal for today is " to get into a long term inpatient facility."  Patient observed socializing with peers in the dayroom.  Offered no complaint.

## 2017-04-04 NOTE — [facility] Suicide Risk Assessment (Signed)
[facility] Discharge Suicide Risk Assessment   Principal Problem: PTSD (post-traumatic stress disorder) Discharge Diagnoses:  Patient Active Problem List   Diagnosis Date Noted  . Polysubstance (excluding opioids) dependence, daily use (HCC) [F19.20] 11/30/2016  . Polysubstance dependence including opioid type drug, continuous use (HCC) [F11.20, F19.20] 11/30/2016  . History of 3 spontaneous abortions [Z87.42] 03/24/2016  . Eczema [L30.9] 03/24/2016  . Depression [F32.9] 03/24/2016  . Asthma without status asthmaticus without complication [J45.909] 03/24/2016  . Anxiety [F41.9] 03/24/2016  . Opiate abuse, continuous (HCC) [F11.10] 03/24/2016  . Fibromyalgia [M79.7] 04/17/2015  . Chronic hepatitis C virus infection (HCC) [B18.2] 02/28/2015  . Chronic migraine [G43.709] 07/23/2014  . Bipolar disorder (HCC) [F31.9] 05/10/2013  . PTSD (post-traumatic stress disorder) [F43.10] 05/10/2013  . Substance abuse (HCC) [F19.10] 10/03/2012    Total Time spent with patient: 30 minutes  Musculoskeletal: Strength & Muscle Tone: within normal limits Gait & Station: normal Patient leans: N/A  Psychiatric Specialty Exam: Review of Systems  Constitutional: Negative for chills and fever.  Respiratory: Negative for cough and shortness of breath.   Cardiovascular: Negative for chest pain.  Gastrointestinal: Negative for abdominal pain, heartburn, nausea and vomiting.  Psychiatric/Behavioral: Negative for depression, hallucinations and suicidal ideas. The patient is nervous/anxious.     Blood pressure 106/63, pulse 87, temperature 98.2 F (36.8 C), temperature source Oral, resp. rate 18, height 5' 1.81" (1.57 m), weight 73 kg (161 lb).Body mass index is 29.63 kg/m.  General Appearance: Casual and Fairly Groomed  Patent attorney::  Fair  Speech:  Clear and Coherent and Normal Rate  Volume:  Normal  Mood:  Anxious  Affect:  Blunt and Congruent  Thought Process:  Coherent and Goal Directed  Orientation:   Full (Time, Place, and Person)  Thought Content:  Logical  Suicidal Thoughts:  No  Homicidal Thoughts:  No  Memory:  Immediate;   Fair Recent;   Fair Remote;   Fair  Judgement:  Fair  Insight:  Lacking  Psychomotor Activity:  Normal  Concentration:  Fair  Recall:  [employer] of Knowledge:Fair  Language: Fair  Akathisia:  No  Handed:    AIMS (if indicated):     Assets:  Resilience  Sleep:  Number of Hours: 6.25  Cognition: WNL  ADL's:  Intact   Mental Status Per Nursing Assessment::   On Admission:  NA  Demographic Factors:  Low socioeconomic status  Loss Factors: Financial problems/change in socioeconomic status  Historical Factors: Impulsivity  Risk Reduction Factors:   Positive social support, Positive therapeutic relationship and Positive coping skills or problem solving skills  Continued Clinical Symptoms:  Severe Anxiety and/or Agitation Alcohol/Substance Abuse/Dependencies More than one psychiatric diagnosis Unstable or Poor Therapeutic Relationship Previous Psychiatric Diagnoses and Treatments  Cognitive Features That Contribute To Risk:  Closed-mindedness    Suicide Risk:  Minimal: No identifiable suicidal ideation.  Patients presenting with no risk factors but with morbid ruminations; may be classified as minimal risk based on the severity of the depressive symptoms  Follow-up Information    Services, [facility] Follow up on 04/05/2017.   Why:  Screening for possible admsision on Tuesday at 8:00AM. Please bring Photo ID, medications/prescriptions provided by hospital, and clothing. (no leggings or yoga pants). Thank you.  Contact information: [address] [place] [address] [phone]        [facility] Follow up on 04/05/2017.   Specialty:  Behavioral Health Why:  Walk in within 3 days of rehab discharge to be assessed  for outpatient mental health services including: medication management and therapy. Walk in hours: Mon-Fri 8am-9am.  Thank you. Contact information: [address] [place] [address] [phone]         Subjective Data: Clinton Gallantadia Colaizzi is a 32 y/o F with history of PTSD and polysubstance abuse who was admitted voluntarily with worsening depression, anxiety, and relapse of substance use. Pt had been off of her psychotropic medications for 2 months. Pt has relevant [facility] and [facility], but she only stayed for 2 weeks. Pt then was in the [place] area with her mother and had relapse of substance use and worsening of mood symptoms, so her [facility] and [facility]. Pt was started on CIWA protocol. She was resumed on previous medications of depakote and seroquel, and doses were titrated up during her stay. She was also started on prazosin for nightmares, gabapentin for anxiety, and propranolol for anxiety.  Today upon evaluation, pt shares, "I'm doing okay, I guess." She notes some physical discomfort from constipation which has not been relieved by milk of magnesia. Her other complaint today is some ongoing anxiety. Pt had been started on propranolol, and we discussed increasing the dose. She notes some disturbed sleep associated with nightmares, and she would like to change prazosin to 3mg  qhs as she was previously taking. Pt denies SI/HI/AH/VH. She remains in agreement for discharge tomorrow AM with plan [facility] discharge for intake evaluation for substance use treatment. Pt is in agreement to continue her current regimen of medication with adjustments of increased dose of propranolol and prazosin. She was able to engage in safety planning including plan to [facility] or contact emergency services if she feels unable to maintain her own safety or the safety of others. Pt had no further questions, comments, or concerns.   Plan Of Care/Follow-up recommendations:   -Discharge  to outpatient level of care (pt [facility])  - PTSD             - Continue seroquel 50mg  po TID and seroquel 300mg  po qhs             - Change prazosin 2mg  po qhs to prazosin 3mg  po qhs             - Continue depakote 500mg  po BID  -Anxiety    -Change propranolol 10mg  po BID to propranolol 20mg  po BID   - Continue gabapentin 600mg  po TID   - Continue atarax 50mg  po q8h prn anxiety  -EPS   - Continue cogentin 0.5mg  po BID   Activity:  as tolerated Diet:  normal Tests:  NA Other:  see above for DC plan  Micheal Likenshristopher T Lashana Spang, MD 04/04/2017, 4:45 PM

## 2017-04-04 NOTE — [facility] Group Notes (Signed)
LCSW Group Therapy Note 04/04/2017 2:50 PM  Type of Therapy and Topic: Group Therapy: Overcoming Obstacles  Participation Level: Active  Description of Group:  In this group patients will be encouraged to explore what they see as obstacles to their own wellness and recovery. They will be guided to discuss their thoughts, feelings, and behaviors related to these obstacles. The group will process together ways to cope with barriers, with attention given to specific choices patients can make. Each patient will be challenged to identify changes they are motivated to make in order to overcome their obstacles. This group will be process-oriented, with patients participating in exploration of their own experiences as well as giving and receiving support and challenge from other group members.  Therapeutic Goals: 1. Patient will identify personal and current obstacles as they relate to admission. 2. Patient will identify barriers that currently interfere with their wellness or overcoming obstacles.  3. Patient will identify feelings, thought process and behaviors related to these barriers. 4. Patient will identify two changes they are willing to make to overcome these obstacles:   Summary of Patient Progress   Kathy [name] was very intrusive and disruptive during today's group session. The patient expressed inappropriately how she feels she has not received adequate treatment while in the hospital. Kathy [name] did however engage in the group's discussion regarding obstacles, stating that her nursing staff was an obstacle that kept her from getting well.      Therapeutic Modalities:  Cognitive Behavioral Therapy Solution Focused Therapy Motivational Interviewing Relapse Prevention Therapy   Alcario DroughtJolan Luman Holway LCSWA Clinical Social Worker

## 2017-04-04 NOTE — Progress Notes (Addendum)
D.  Pt anxious on approach, complaint of stomach pain and constipation noted.  Pt was positive for evening AA group, observed engaged in active interaction with peers on unit.  Requested Librium back but protocol explained to Pt and understanding verbalized.  Pt states "I just hate when I feel that way and can't be still".  Pt denies SI/HI/AVH at this time.  A.  Spoke with with NP and new order for Dulcolax received.   Support and encouragement offered, medication given as ordered  R.  Pt remains safe on the unit, will continue to monitor.

## 2017-04-04 NOTE — Discharge Summary (Signed)
Physician Discharge Summary Note  Patient:  Kathy [name] is an 32 y.o., female MRN:  [id number] DOB:  Oct 26, 1985 Patient phone:  [phone] (home)  Patient address:   [place] [address],  Total Time spent with patient: Greater than 30 minutes  Date of Admission:  03/31/2017  Date of Discharge: 04-05-17  Reason for Admission:   Principal Problem: PTSD (post-traumatic stress disorder)  Discharge Diagnoses: Patient Active Problem List   Diagnosis Date Noted  . Polysubstance dependence including opioid type drug, continuous use (HCC) [F11.20, F19.20] 11/30/2016    Priority: High  . Bipolar disorder (HCC) [F31.9] 05/10/2013    Priority: Medium  . PTSD (post-traumatic stress disorder) [F43.10] 05/10/2013    Priority: Low  . Polysubstance (excluding opioids) dependence, daily use (HCC) [F19.20] 11/30/2016  . History of 3 spontaneous abortions [Z87.42] 03/24/2016  . Eczema [L30.9] 03/24/2016  . Depression [F32.9] 03/24/2016  . Asthma without status asthmaticus without complication [J45.909] 03/24/2016  . Anxiety [F41.9] 03/24/2016  . Opiate abuse, continuous (HCC) [F11.10] 03/24/2016  . Fibromyalgia [M79.7] 04/17/2015  . Chronic hepatitis C virus infection (HCC) [B18.2] 02/28/2015  . Chronic migraine [G43.709] 07/23/2014  . Substance abuse [facility]) [F19.10] 10/03/2012   Past Psychiatric History: Hx. Opioid use disorder, PTSD, Bipolar affective disorder.  Past Medical History:  Past Medical History:  Diagnosis Date  . Anxiety   . Bipolar disorder (HCC)   . Compression fracture of spine (HCC)   . Fibromyalgia   . Headache   . PTSD (post-traumatic stress disorder)   . Seizures (HCC)    Related to benzo withdrawal    Past Surgical History:  Procedure Laterality Date  . HAND SURGERY Right    Family History: History reviewed. No pertinent family history.  Family Psychiatric  History: See H&P  Social History:  Social History   Substance and Sexual Activity   Alcohol Use Yes   Comment: Daily. Last drink: PTA  from [facility], Drinks beer and liquor.      Social History   Substance and Sexual Activity  Drug Use Yes  . Types: Cocaine, Benzodiazepines, Heroin, Hydrocodone   Comment: Cocaine: last used; Heroin: last used 09/17/2016;     Social History   Socioeconomic History  . Marital status: Single    Spouse name: None  . Number of children: None  . Years of education: None  . Highest education level: None  Social Needs  . Financial resource strain: None  . Food insecurity - worry: None  . Food insecurity - inability: None  . Transportation needs - medical: None  . Transportation needs - non-medical: None  Occupational History  . None  Tobacco Use  . Smoking status: Current Every Day Smoker    Packs/day: 1.00    Types: Cigarettes  . Smokeless tobacco: Never Used  . Tobacco comment: Pt does not want to stop smoking at this time  Substance and Sexual Activity  . Alcohol use: Yes    Comment: Daily. Last drink: PTA  from [facility], Drinks beer and liquor.   . Drug use: Yes    Types: Cocaine, Benzodiazepines, Heroin, Hydrocodone    Comment: Cocaine: last used; Heroin: last used 09/17/2016;   . Sexual activity: Yes    Birth control/protection: Injection    Comment: Depo Provera  Other Topics Concern  . None  Social History Narrative  . None   Hospital Course: (Per Md's SRA): Kathy [name] is a 32 y/o F with history of PTSD and polysubstance abuse who was admitted voluntarily  with worsening depression, anxiety, and relapse of substance use. Pt had been off of her psychotropic medications for 2 months. Pt has relevant history of DC from [facility] in October 2018 and then going to [facility] for substance use treatment, but she only stayed for 2 weeks. Pt then was in the [place] area with her mother and had relapse of substance use and worsening of mood symptoms, so her mother brought her to [facility] for additional treatment and evaluation. Pt was started on  CIWA protocol. She was resumed on previous medications of depakote and seroquel, and doses were titrated up during her stay. She was also started on prazosin for nightmares, gabapentin for anxiety, and propranolol for anxiety  This is one of several discharge summaries for Kathy [name], known on this unit from previous hospitalizations & treatments. She was recently discharged from this hospital & was sent to the [facility] for further substance abuse treatment after discharge. She came back to the hospital this time around for re-admission due to her drug relapse & being off her mental health medications for more than 2 months.  During evaluation of her presenting complaints, chart review showed Kathy [name]'s UDS was positive for Benzodiazepine & cocaine. She does have hx of mental illness of which she was on medications for, however, had stopped taking her medications in December, 2018 after completing substance abuse treatment at [facility].  Kathy [name] was aware of her chronic drug addiction problems, She stated that she did not know how to stop using drugs. She presented with high anxiety level & agitation. She agreed to re-start her mental health medications.  During the course of this present hospitalization, Kathy [name] was medicated & discharged on; Cogentin 0.5 mg for EPS, Depakote 500 mg for mood stabilization, Hydroxyzine 50 mg prn for anxiety, Nicorette gum 2 mg for smoking cessation, Minipress 2 mg for PTSD symptoms, Propranolol 10 mg for high anxiety, Seroquel 50 mg for agitation & Seroquel 300 mg for mood control. She was also enrolled in the group counseling sessions, AA/NA meetings being offered & held on this unit. She participated & learned coping skills that should help her after discharge to cope better & manage her substance abuse/mental health issues to maintain sobriety. Her discharge plans included a referral to the [facility] for further substance abuse treatment after discharge. She  presented no other health issues that required treatment and or monitoring.  Kathy [name] tolerated her treatment regimen without any significant adverse effects and or reactions reported.   Kathy [name]'s mood symptoms are now stabilized. She is currently medically & mentally stable to be discharged to continue mental health care on an outpatient basis. She has been referred to the [facility] treatment Center in [place], [place] to pursue further substance abuse treatment. She was provided with all the necessary information needed to make this appointment without problems.   Upon discharge, [name] adamantly denies any suicidal, homicidal ideations, delusional thought, auditory, visual hallucinations & or substance withdrawal symptoms. She left [facility] with all personal belongings in no apparent distress. Transportation per mother.  Physical Findings: AIMS: Facial and Oral Movements Muscles of Facial Expression: None, normal Lips and Perioral Area: None, normal Jaw: None, normal Tongue: None, normal,Extremity Movements Upper (arms, wrists, hands, fingers): None, normal Lower (legs, knees, ankles, toes): None, normal, Trunk Movements Neck, shoulders, hips: None, normal, Overall Severity Severity of abnormal movements (highest score from questions above): None, normal Incapacitation due to abnormal movements: None, normal Patient's awareness of abnormal movements (rate only patient's report): No  Awareness, Dental Status Current problems with teeth and/or dentures?: No Does patient usually wear dentures?: No  CIWA:  CIWA-Ar Total: 5 COWS:  COWS Total Score: 10  Musculoskeletal: Strength & Muscle Tone: within normal limits Gait & Station: normal Patient leans: N/A  Psychiatric Specialty Exam: Physical Exam  Constitutional: She appears well-developed and well-nourished.  HENT:  Head: Normocephalic.  Eyes: Pupils are equal, round, and reactive to light.  Neck: Normal range of motion.   Cardiovascular: Normal rate.  Respiratory: Effort normal.  GI: Soft.  Genitourinary:  Genitourinary Comments: Deferred  Musculoskeletal: Normal range of motion.  Neurological: She is alert.  Skin: Skin is warm.    Review of Systems  Constitutional: Negative.   HENT: Negative.   Eyes: Negative.   Respiratory: Negative.   Cardiovascular: Negative.   Gastrointestinal: Negative.   Genitourinary: Negative.   Musculoskeletal: Negative.   Skin: Negative.   Endo/Heme/Allergies: Negative.   Psychiatric/Behavioral: Positive for depression (Stable), hallucinations (Hx. psychosis) and substance abuse (Hx. Polysubstance use disorder ). Negative for memory loss and suicidal ideas. The patient has insomnia (Stable ). The patient is not nervous/anxious.     Blood pressure 109/63, pulse 86, temperature 98.2 F (36.8 C), temperature source Oral, resp. rate 18, height 5' 1.81" (1.57 m), weight 73 kg (161 lb).Body mass index is 29.63 kg/m.  See Md's SRA   Have you used any form of tobacco in the last 30 days? (Cigarettes, Smokeless Tobacco, Cigars, and/or Pipes): Yes  Has this patient used any form of tobacco in the last 30 days? (Cigarettes, Smokeless Tobacco, Cigars, and/or Pipes): Yes, Recommended nicorette gum 2 mg for smoking cessation..   Blood Alcohol level:  Lab Results  Component Value Date   ETH <10 03/30/2017   ETH <10 11/29/2016   Metabolic Disorder Labs:  Lab Results  Component Value Date   HGBA1C 5.2 03/24/2016   MPG 103 03/24/2016   Lab Results  Component Value Date   PROLACTIN 54.1 (H) 03/24/2016   Lab Results  Component Value Date   CHOL 164 03/24/2016   TRIG 99 03/24/2016   HDL 47 03/24/2016   CHOLHDL 3.5 03/24/2016   VLDL 20 03/24/2016   LDLCALC 97 03/24/2016   See Psychiatric Specialty Exam and Suicide Risk Assessment completed by Attending Physician prior to discharge.  Discharge destination:  [facility]  Is patient on multiple antipsychotic  therapies at discharge:  No   Has Patient had three or more failed trials of antipsychotic monotherapy by history:  No  Recommended Plan for Multiple Antipsychotic Therapies: NA  Allergies as of 04/04/2017   No Known Allergies     Medication List    TAKE these medications     Indication  benztropine 0.5 MG tablet Commonly known as:  COGENTIN Take 1 tablet (0.5 mg total) by mouth 2 (two) times daily. For prevention What changed:  additional instructions  Indication:  Extrapyramidal Reaction caused by Medications   divalproex 500 MG DR tablet Commonly known as:  DEPAKOTE Take 1 tablet (500 mg total) by mouth every 12 (twelve) hours. For mood stabilization What changed:  additional instructions  Indication:  Mood stablization   gabapentin 300 MG capsule Commonly known as:  NEURONTIN Take 2 capsules (600 mg total) by mouth 3 (three) times daily at 8am, 3pm and bedtime. For agitation/substance withdrawal syndrome What changed:  additional instructions  Indication:  Agitation, Subtsance withdrawal syndrome   hydrOXYzine 50 MG tablet Commonly known as:  ATARAX/VISTARIL Take 1 tablet (50 mg) by  mouth four times daily as needed: For anxiety  Indication:  Feeling Anxious   nicotine polacrilex 2 MG gum Commonly known as:  NICORETTE Take 1 each (2 mg total) by mouth as needed for smoking cessation. (May purchase from over the counter): For smoking cessation What changed:  additional instructions  Indication:  Nicotine Addiction   prazosin 2 MG capsule Commonly known as:  MINIPRESS Take 1 capsule (2 mg total) by mouth at bedtime. For PTSD related nightmares What changed:  additional instructions  Indication:  PTSD related Nightmares   propranolol 10 MG tablet Commonly known as:  INDERAL Take 1 tablet (10 mg total) by mouth 2 (two) times daily. For anxiety  Indication:  Anxiety   QUEtiapine 300 MG tablet Commonly known as:  SEROQUEL Take 1 tablet (300 mg total) by mouth at  bedtime. For mood control What changed:    medication strength  additional instructions  Indication:  Mood control   QUEtiapine 50 MG tablet Commonly known as:  SEROQUEL Take 1 tablet (50 mg total) by mouth 3 (three) times daily. For agitation/mood control What changed:  You were already taking a medication with the same name, and this prescription was added. Make sure you understand how and when to take each.  Indication:  Agitation/mood control      Follow-up Information    Services, [facility] Follow up on 04/05/2017.   Why:  Screening for possible admsision on Tuesday at 8:00AM. Please bring Photo ID, medications/prescriptions provided by hospital, and clothing. (no leggings or yoga pants). Thank you.  Contact information: [address] [place] [address] [phone]        [facility] Follow up on 04/05/2017.   Specialty:  Behavioral Health Why:  Walk in within 3 days of rehab discharge to be assessed for outpatient mental health services including: medication management and therapy. Walk in hours: Mon-Fri 8am-9am. Thank you. Contact informationElpidio Eric: [address] [place] [address] [phone]          Follow-up recommendations: Activity:  As tolerated Diet: As recommended by your primary care doctor. Keep all scheduled follow-up appointments as recommended.   Comments: Patient is instructed prior to discharge to: Take all medications as prescribed by his/her mental healthcare provider. Report any adverse effects and or reactions from the medicines to his/her outpatient provider promptly. Patient has been instructed & cautioned: To not engage in alcohol and or illegal drug use while on prescription medicines. In the event of worsening symptoms, patient is instructed to call the crisis hotline, 911 and or go to the nearest ED for appropriate evaluation and treatment of symptoms. To follow-up with his/her primary care provider for your other medical issues,  concerns and or health care needs.   Signed: Armandina StammerAgnes Nwoko, NP, PMHNP, FNP-BC 04/04/2017, 10:57 AM   Patient seen, Suicide Assessment Completed.  Disposition Plan Reviewed

## 2017-04-04 NOTE — Progress Notes (Signed)
Recreation Therapy Notes  Date: 04/03/17 Time: 0930 Location: [address] Dayroom  Group Topic: Stress Management  Goal Area(s) Addresses:  Patient will verbalize importance of using healthy stress management.  Patient will identify positive emotions associated with healthy stress management.   Behavioral Response: Engaged  Intervention: Stress Management  Activity :  [employer]ountain Meditation.  LRT played a meditation on the power of mountains to be resilient in the face of change.  Patients were to follow along with the meditation as it played.  Education:  Stress Management, Discharge Planning.   Education Outcome: Acknowledges edcuation/In group clarification offered/Needs additional education  Clinical Observations/Feedback: Pt attended group.     Caroll RancherMarjette Ryan Palermo, LRT/CTRS    Lillia AbedLindsay, Zenobia Kuennen A 04/04/2017 10:59 AM

## 2017-04-04 NOTE — Progress Notes (Signed)
Adult Psychoeducational Group Note  Date:  04/04/2017 Time:  1:37 PM  Group Topic/Focus:  Orientation:   The focus of this group is to educate the patient on the purpose and policies of crisis stabilization and provide a format to answer questions about their admission.  The group details unit policies and expectations of patients while admitted.  Participation Level:  Active  Participation Quality:  Appropriate  Affect:  Appropriate  Cognitive:  Alert  Insight: Appropriate  Engagement in Group:  Improving  Modes of Intervention:  Orientation  Additional Comments:  Pt has a goal of being better in life, [facility]  Donell BeersRodney S Carlo Lorson 04/04/2017, 1:37 PM

## 2017-04-04 NOTE — [facility] Group Notes (Deleted)
LCSW Group Therapy Note 04/04/2017 1:28 PM  Type of Therapy/Topic: Group Therapy: Balance in Life  Participation Level: Active  Description of Group:  This group will address the concept of balance and how it feels and looks when one is unbalanced. Patients will be encouraged to process areas in their lives that are out of balance and identify reasons for remaining unbalanced. Facilitators will guide patients in utilizing problem-solving interventions to address and correct the stressor making their life unbalanced. Understanding and applying boundaries will be explored and addressed for obtaining and maintaining a balanced life. Patients will be encouraged to explore ways to assertively make their unbalanced needs known to significant others in their lives, using other group members and facilitator for support and feedback.  Therapeutic Goals: 1. Patient will identify two or more emotions or situations they have that consume much of in their lives. 2. Patient will identify signs/triggers that life has become out of balance:  3. Patient will identify two ways to set boundaries in order to achieve balance in their lives:  4. Patient will demonstrate ability to communicate their needs through discussion and/or role plays  Summary of Patient Progress:  [name] was very intrusive and disruptive during today's group session. The patient expressed inappropriately how she feels she has not received adequate treatment while in the hospital. [name] did however engage in the group's discussion regarding obstacles, stating that her nursing staff was an obstacle that kept her from getting well.    Therapeutic Modalities:  Cognitive Behavioral Therapy Solution-Focused Therapy Assertiveness Training   Alcario DroughtJolan Yojan Paskett LCSWA Clinical Social Worker

## 2017-04-04 NOTE — Progress Notes (Signed)
  [facility]  Will you be returning to the same living situation after discharge:  No. [facility] appointment is 04/05/17. At discharge, do you have transportation home?: Yes,  TAXI VOUCHER - PATIENT MUST LEAVE BY 7:00am TO BE ON TIME FOR SCREENING APPOINTMENT. TAXI VOUCHER IS ON THE FRONT OF THE PATIENT"S CHART.  Do you have the ability to pay for your medications: No.  Release of information consent forms completed and in the chart;  Patient's signature needed at discharge.  Patient to Follow up at: Follow-up Information    [facility] Follow up on 04/05/2017.   Why:  Screening for possible admsision on Tuesday at 8:00AM. Please bring Photo ID, medications/prescriptions provided by hospital, and clothing. (no leggings or yoga pants). Thank you.  Contact information: [address] [place] [address] [phone]        [facility] Follow up on 04/05/2017.   Specialty:  Behavioral Health Why:  Walk in within 3 days of rehab discharge to be assessed for outpatient mental health services including: medication management and therapy. Walk in hours: Mon-Fri 8am-9am. Thank you. Contact information: [address] [place] [address] [phone]           Next level of care provider has access to [facility] Link:yes  Safety Planning and Suicide Prevention discussed: Yes,  with the patient  Have you used any form of tobacco in the last 30 days? (Cigarettes, Smokeless Tobacco, Cigars, and/or Pipes): Yes  Has patient been referred to the Quitline?: Patient refused referral  Patient has been referred for addiction treatment: Pt. refused referral  Maeola SarahJolan E Lillymae Duet, LCSWA 04/04/2017, 9:13 AM

## 2017-04-04 NOTE — Progress Notes (Signed)
Patient did attend the evening speaker AA meeting.  

## 2017-04-05 ENCOUNTER — Ambulatory Visit (HOSPITAL_COMMUNITY)
Admission: RE | Admit: 2017-04-05 | Discharge: 2017-04-05 | Disposition: A | Discharge status: Home / Self Care | Payer: Self-pay | Attending: Psychiatry | Admitting: Psychiatry

## 2017-04-05 DIAGNOSIS — Z1330 Encounter for screening examination for mental health and behavioral disorders, unspecified: Secondary | ICD-10-CM | POA: Insufficient documentation
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
## 2017-04-05 NOTE — Progress Notes (Signed)
[facility] this [facility]  Discharge instructions were reviewed with patient.  All belongings contained in locker #58 were returned to pt prior to discharge.  All paperwork signed and sample med[facility] given to [facility].  Pt anxious about going, but ready to go to next step of her recovery.  [facility]

## 2017-04-05 NOTE — H&P (Signed)
Behavioral Health Medical Screening Exam  Kathy [name] is an 32 y.o. [facility] today. She stated that she was unable to be admitted to the treatment program as planned due to family care insurance. She denies any SI/HI/VAH, stated that she wants a place to stay to avoid going back where she can be exposed to drug use again. She also stated that she wants her medication to be well stabilized before returning home.  Total Time spent with patient: 30 minutes  Psychiatric Specialty Exam: Physical Exam  Vitals reviewed. Constitutional: She is oriented to person, place, and time. She appears well-developed and well-nourished.  HENT:  Head: Normocephalic and atraumatic.  Eyes: Pupils are equal, round, and reactive to light.  Neck: Normal range of motion.  Cardiovascular: Normal rate, regular rhythm and normal heart sounds.  Respiratory: Effort normal and breath sounds normal.  GI: Soft. Bowel sounds are normal.  Musculoskeletal: Normal range of motion.  Neurological: She is alert and oriented to person, place, and time.  Skin: Skin is warm and dry.    Review of Systems  Psychiatric/Behavioral: Positive for substance abuse. Negative for depression, hallucinations, memory loss and suicidal ideas. The patient is not nervous/anxious and does not have insomnia.   All other systems reviewed and are negative.   Blood pressure 105/70, pulse 80, temperature (!) 97.2 F (36.2 C), temperature source Oral, resp. rate 18, SpO2 100 %.There is no height or weight on file to calculate BMI.  General Appearance: Casual  Eye Contact:  Good  Speech:  Clear and Coherent and Normal Rate  Volume:  Normal  Mood:  Euthymic  Affect:  Congruent  Thought Process:  Coherent and Goal Directed  Orientation:  Full (Time, Place, and Person)  Thought Content:  WDL and Logical  Suicidal Thoughts:  No  Homicidal Thoughts:  No  Memory:  Immediate;   Good Recent;    Good Remote;   Fair  Judgement:  Good  Insight:  Good and Present  Psychomotor Activity:  Normal  Concentration: Concentration: Good and Attention Span: Good  Recall:  Good  Fund of Knowledge:Good  Language: Good  Akathisia:  Negative  Handed:  Right  AIMS (if indicated):     Assets:  Communication Skills Desire for Improvement Financial Resources/Insurance Physical Health Social Support  Sleep:       Musculoskeletal: Strength & Muscle Tone: within normal limits Gait & Station: normal Patient leans: N/A  Blood pressure 105/70, pulse 80, temperature (!) 97.2 F (36.2 C), temperature source Oral, resp. rate 18, SpO2 100 %.  Recommendations:  Based on my evaluation the patient does not appear to have an emergency medical condition.  Delila PereyraJustina A Okonkwo, NP 04/05/2017, 2:23 PM

## 2017-04-05 NOTE — Progress Notes (Signed)
Pt has been in the dayroom most of the evening.  She attended evening AA group.  She denies SI/HI/AVH, but continues to be medication focused.  Pt is aware that she is [facility], but this evening she is concerned that her "meds are not right" and feels she may need to stay until they are.  Pt also requested  Zyprexa Zydis 5mg  as she received Saturday and Sunday night for anxiety and to help her sleep.  Pt is constantly complaining about something since her admission.  She was heard earlier in the shift on the phone with her parents cursing and swearing.  Writer spoke to the provider who ordered a Zyprexa Zydis for the night.  Writer informed pt that the plans for [facility] are still in place and that they are expecting early in the morning.  Pt voices understanding.  Support and encouragement offered.  Discharge plans are in process.  Safety maintained with q15 minute checks.

## 2018-05-23 ENCOUNTER — Emergency Department
Admission: EM | Admit: 2018-05-23 | Discharge: 2018-05-23 | Discharge status: Against Medical Advice | Payer: Self-pay | Attending: Emergency Medicine | Admitting: Emergency Medicine

## 2018-05-23 ENCOUNTER — Encounter: Payer: Self-pay | Admitting: Emergency Medicine

## 2018-05-23 DIAGNOSIS — J45909 Unspecified asthma, uncomplicated: Secondary | ICD-10-CM | POA: Insufficient documentation

## 2018-05-23 DIAGNOSIS — T50901A Poisoning by unspecified drugs, medicaments and biological substances, accidental (unintentional), initial encounter: Secondary | ICD-10-CM | POA: Insufficient documentation

## 2018-05-23 DIAGNOSIS — F1910 Other psychoactive substance abuse, uncomplicated: Secondary | ICD-10-CM | POA: Insufficient documentation

## 2018-05-23 HISTORY — DX: Unspecified asthma, uncomplicated: J45.909

## 2018-05-23 NOTE — ED Triage Notes (Addendum)
Pt arrives via pov. Pt unconscious with agonal breathing. Pt being bagged and MD at bedside. Xanax found it pt's bra

## 2018-05-23 NOTE — ED Provider Notes (Signed)
[facility] Emergency Department Provider Note   ____________________________________________    I have reviewed the triage vital signs and the nursing notes.   HISTORY  Chief Complaint Drug Overdose     HPI Kathy [name] is a 33 y.o. female who presents unresponsive in respiratory distress.  Patient pulled out of car by nursing team.  No significant history available except that the patient was just picked up from a friend's house.  Xanax pill fell out of the patient's mouth  Past Medical History:  Diagnosis Date  . Asthma     There are no active problems to display for this patient.   Past Surgical History:  Procedure Laterality Date  . HAND SURGERY      Prior to Admission medications   Not on File     Allergies Patient has no allergy information on record.  No family history on file.  Social History Social History   Tobacco Use  . Smoking status: Not on file  Substance Use Topics  . Alcohol use: Never    Frequency: Never  . Drug use: Yes    Unable to obtain review of Systems     ____________________________________________   PHYSICAL EXAM:  VITAL SIGNS: ED Triage Vitals  Enc Vitals Group     BP 05/23/18 1221 134/82     Pulse Rate 05/23/18 1221 (!) 116     Resp --      Temp --      Temp src --      SpO2 05/23/18 1221 100 %     Weight 05/23/18 1223 84.8 kg (187 lb)     Height 05/23/18 1223 1.702 m (5\' 7" )     Head Circumference --      Peak Flow --      Pain Score --      Pain Loc --      Pain Edu? --      Excl. in GC? --     Constitutional: Unresponsive, no response to sternal rub Eyes: Normal pupils Head: Atraumatic.   Mouth/Throat: Mucous membranes are moist.    Cardiovascular: Normal rate, regular rhythm. Good peripheral circulation. Respiratory: Agonal respirations Gastrointestinal: Soft No distention.   Musculoskeletal:   Warm and well perfused Neurologic: Unresponsive Skin:  Skin is warm,  dry and intact. No rash noted.   ____________________________________________   LABS (all labs ordered are listed, but only abnormal results are displayed)  Labs Reviewed - No data to display ____________________________________________  EKG  No ____________________________________________  RADIOLOGY  None __________________________________   PROCEDURES  Procedure(s) performed: No  Procedures   Critical Care performed: yes  CRITICAL CARE Performed by: Jene Every   Total critical care time: 30 minutes  Critical care time was exclusive of separately billable procedures and treating other patients.  Critical care was necessary to treat or prevent imminent or life-threatening deterioration.  Critical care was time spent personally by me on the following activities: development of treatment plan with patient and/or surrogate as well as nursing, discussions with consultants, evaluation of patient's response to treatment, examination of patient, obtaining history from patient or surrogate, ordering and performing treatments and interventions, ordering and review of laboratory studies, ordering and review of radiographic studies, pulse oximetry and re-evaluation of patient's condition.  ____________________________________________   INITIAL IMPRESSION / ASSESSMENT AND PLAN / ED COURSE  Pertinent labs & imaging results that were available during my care of the patient were reviewed by me and considered in my  medical decision making (see chart for details).  Patient presents unresponsive, with agonal respirations.  Xanax pill found on the patient, concerning presentation for recreational drug overdose.  Bagged patient with improvement in oxygen saturation, IV established.  Preparations made for intubation.  Ordered 1 mg of IV Narcan with rapid improvement in mental status.  Intubation called off  Patient is now upset and quite concerned about where her bag is and where  her money is.  She wants to leave immediately.  This was a recreational drug overdose, accidental.  I convinced her that she needs to stay for a period of time for us to ensure her stability otherwise I will need to consider involuntary commitment   ----------------------------------------- 1:23 PM on 05/23/2018 -----------------------------------------  Patient remains alert and awake.  She still mildly tachycardic but she refuses to stay any longer and she accepts the risk of rebound sedation due to Narcan wearing off.  I told her I would prefer to observe her longer until her tachycardia improves and I can be sure that she will be safe but she has decisional capacity and would like to leave    ____________________________________________   FINAL CLINICAL IMPRESSION(S) / ED DIAGNOSES  Final diagnoses:  Polysubstance abuse (HCC)  Accidental drug overdose, initial encounter        Note:  This document was prepared using Dragon voice recognition software and may include unintentional dictation errors.   Jene EveryKinner, Quintan Saldivar, MD 05/23/18 1329

## 2018-05-23 NOTE — ED Notes (Signed)
The patient was complaining that she had money in her bra that was cut off. She asked me about it and I stated to her that all her belongings were placed into the white patient belongings bag she had by her side. She then made a phone call to someone and I heard her ask is he or she had her money and hair piece. She then said thank god, please let me have it when I get out of here.

## 2018-05-23 NOTE — ED Notes (Signed)
Dave at bedside.

## 2018-05-23 NOTE — ED Notes (Addendum)
Pt's shirt cut in emergent effort to apply AED pads; Belongings placed into belonging bag by RN witnessed by Tammy Sours, Charity fundraiser. No money found at this time. XANAX bar found in pt's bra

## 2018-05-23 NOTE — ED Notes (Signed)
Pt a&o x 4. Respiration equal and unlabored. 97% on room air with respirations of 16

## 2018-05-23 NOTE — ED Notes (Signed)
Mardelle Matte replacing Chapin at bedside.

## 2018-05-23 NOTE — ED Notes (Signed)
Pt denies suicidal ideation; pt states she is willing to stay for 1 hour

## 2018-05-23 NOTE — ED Notes (Signed)
Xanax bar that was found on patient placed in a cup and put in patients pocket book.

## 2018-05-23 NOTE — ED Notes (Addendum)
Pt becomes responsive and agitated. Pt states she wants her clothes and stuff back. Pt ensured her belongings were placed in belonging bag and would be provided upon discharge.

## 2018-05-23 NOTE — ED Notes (Signed)
1mg  Narcan given IV by RN Morrie Sheldon

## 2018-05-24 ENCOUNTER — Encounter (HOSPITAL_COMMUNITY): Payer: Self-pay

## 2018-12-25 ENCOUNTER — Other Ambulatory Visit: Payer: Self-pay

## 2018-12-25 ENCOUNTER — Emergency Department
Admission: EM | Admit: 2018-12-25 | Discharge: 2018-12-25 | Disposition: A | Discharge status: Home / Self Care | Payer: Self-pay | Attending: Emergency Medicine | Admitting: Emergency Medicine

## 2018-12-25 DIAGNOSIS — T40604A Poisoning by unspecified narcotics, undetermined, initial encounter: Secondary | ICD-10-CM

## 2018-12-25 DIAGNOSIS — J45909 Unspecified asthma, uncomplicated: Secondary | ICD-10-CM | POA: Insufficient documentation

## 2018-12-25 DIAGNOSIS — T40601A Poisoning by unspecified narcotics, accidental (unintentional), initial encounter: Secondary | ICD-10-CM | POA: Insufficient documentation

## 2018-12-25 DIAGNOSIS — T402X4A Poisoning by other opioids, undetermined, initial encounter: Secondary | ICD-10-CM | POA: Insufficient documentation

## 2018-12-25 DIAGNOSIS — Z046 Encounter for general psychiatric examination, requested by authority: Secondary | ICD-10-CM | POA: Insufficient documentation

## 2018-12-25 DIAGNOSIS — F17210 Nicotine dependence, cigarettes, uncomplicated: Secondary | ICD-10-CM | POA: Insufficient documentation

## 2018-12-25 DIAGNOSIS — Z79899 Other long term (current) drug therapy: Secondary | ICD-10-CM | POA: Insufficient documentation

## 2018-12-25 DIAGNOSIS — F1910 Other psychoactive substance abuse, uncomplicated: Secondary | ICD-10-CM

## 2018-12-25 LAB — CBC
HCT: 35.5 % — ABNORMAL LOW (ref 36.0–46.0)
Hemoglobin: 11.8 g/dL — ABNORMAL LOW (ref 12.0–15.0)
MCH: 26.6 pg (ref 26.0–34.0)
MCHC: 33.2 g/dL (ref 30.0–36.0)
MCV: 80 fL (ref 80.0–100.0)
Platelets: 306 10*3/uL (ref 150–400)
RBC: 4.44 MIL/uL (ref 3.87–5.11)
RDW: 16.2 % — ABNORMAL HIGH (ref 11.5–15.5)
WBC: 12.6 10*3/uL — ABNORMAL HIGH (ref 4.0–10.5)
nRBC: 0 % (ref 0.0–0.2)

## 2018-12-25 LAB — COMPREHENSIVE METABOLIC PANEL
ALT: 18 U/L (ref 0–44)
AST: 19 U/L (ref 15–41)
Albumin: 3.8 g/dL (ref 3.5–5.0)
Alkaline Phosphatase: 52 U/L (ref 38–126)
Anion gap: 11 (ref 5–15)
BUN: 16 mg/dL (ref 6–20)
CO2: 25 mmol/L (ref 22–32)
Calcium: 9.2 mg/dL (ref 8.9–10.3)
Chloride: 106 mmol/L (ref 98–111)
Creatinine, Ser: 0.83 mg/dL (ref 0.44–1.00)
GFR calc Af Amer: >60 mL/min (ref >60)
GFR calc non Af Amer: >60 mL/min (ref >60)
Glucose, Bld: 103 mg/dL — ABNORMAL HIGH (ref 70–99)
Potassium: 3.1 mmol/L — ABNORMAL LOW (ref 3.5–5.1)
Sodium: 142 mmol/L (ref 135–145)
Total Bilirubin: 0.3 mg/dL (ref 0.3–1.2)
Total Protein: 6.9 g/dL (ref 6.5–8.1)

## 2018-12-25 LAB — SALICYLATE LEVEL: Salicylate Lvl: <7 mg/dL (ref 2.8–30.0)

## 2018-12-25 LAB — ETHANOL: Alcohol, Ethyl (B): <10 mg/dL (ref <10)

## 2018-12-25 LAB — ACETAMINOPHEN LEVEL: Acetaminophen (Tylenol), Serum: <10 ug/mL — ABNORMAL LOW (ref 10–30)

## 2018-12-25 MED ORDER — BENZTROPINE MESYLATE 1 MG PO TABS: 0.5000 mg | ORAL_TABLET | Freq: Two times a day (BID) | ORAL | Status: DC

## 2018-12-25 MED ORDER — PRAZOSIN HCL 1 MG PO CAPS: 3.0000 mg | ORAL_CAPSULE | Freq: Every day | ORAL | Status: DC

## 2018-12-25 MED ORDER — NALOXONE HCL 4 MG/0.1ML NA LIQD: NASAL | 1 refills | Status: DC

## 2018-12-25 MED ORDER — GABAPENTIN 300 MG PO CAPS: 600.0000 mg | ORAL_CAPSULE | ORAL | Status: DC

## 2018-12-25 MED ORDER — DIVALPROEX SODIUM 500 MG PO DR TAB: 500.0000 mg | DELAYED_RELEASE_TABLET | Freq: Two times a day (BID) | ORAL | Status: DC

## 2018-12-25 MED ORDER — ALBUTEROL SULFATE HFA 108 (90 BASE) MCG/ACT IN AERS
2.0000 | INHALATION_SPRAY | RESPIRATORY_TRACT | Status: DC | PRN
  Administered 2018-12-25: 10:00:00 2 via RESPIRATORY_TRACT
  Filled 2018-12-25: qty 6.7

## 2018-12-25 MED ORDER — GABAPENTIN 300 MG PO CAPS
600.0000 mg | ORAL_CAPSULE | ORAL | Status: DC
  Administered 2018-12-25: 10:00:00 600 mg via ORAL
  Filled 2018-12-25: qty 2

## 2018-12-25 MED ORDER — HYDROXYZINE HCL 25 MG PO TABS: 50.0000 mg | ORAL_TABLET | Freq: Four times a day (QID) | ORAL | Status: DC | PRN

## 2018-12-25 MED ORDER — PROPRANOLOL HCL 20 MG PO TABS: 20.0000 mg | ORAL_TABLET | Freq: Two times a day (BID) | ORAL | Status: DC

## 2018-12-25 NOTE — Consult Note (Signed)
[facility] Consult   Reason for Consult: IVC by police Referring Physician: Dr. Jari Pigg Patient Identification: Kathy [name] MRN:  [id number] Principal Diagnosis: <principal problem not specified> Diagnosis:  Active Problems:   * No active hospital problems. *   Total Time spent with patient: 30 minutes  Subjective:   Kathy [name] is a 33 y.o. female patient who presented following an overdose and was brought in IVC by police.  HPI: 33 year old patient with history of drug use presents to the emergency department following an overdose.  Per IVC paperwork patient was found by [facility] overdosed and unresponsive in her car.  She was given Narcan at the scene but was still acting under the influence so she was transported here for her medical safety.  Prior to evaluation patient was incessantly cursing at nursing staff demanding to be seen, demanding to be discharged.  She was irritable but cooperative.  Upon evaluation patient denies any suicidal homicidal ideation.  Patient denies any depression or mania.  Patient denies any audio or visual hallucinations.  Patient states that she uses drugs regularly and is unsure why she was brought into the emergency department this time.  Patient states that she does not see a problem with her drug use and will continue to find help on her own.  Patient denies resources for outpatient substance abuse.  Patient states that she will continue to use drugs as she feels that is best for her. Attempts are made to show patient is severity of damage she is doing to her body however patient is unwilling to accept outpatient or inpatient substance abuse rehab resources.   Patient also denies psychiatric history.  She denies any prior suicide attempts.  States that she has gone trouble to low before but mostly in the context of drug use or DWIs.  Patient states denies having a diagnosis, states that diagnosis were placed on her  chart as reasons to admit her to inpatient psych facilities.  Patient states that she has taken psychiatric medications in the past but has no success with them.  Patient has never been psychiatrically hospitalized before.   Past Psychiatric History: Patient has multiple visits to rehabs.  Patient has 2 years of sobriety prior to this May.  Patient formerly seen at methadone clinic.  Risk to Self:  No Risk to Others:  No Prior Inpatient Therapy:  No Prior Outpatient Therapy:  Yes  Past Medical History:  Past Medical History:  Diagnosis Date  . Anxiety   . Asthma   . Bipolar disorder ([place])   . Compression fracture of spine ([place])   . Fibromyalgia   . Headache   . PTSD (post-traumatic stress disorder)   . Seizures ([place])    Related to benzo withdrawal    Past Surgical History:  Procedure Laterality Date  . HAND SURGERY Right   . HAND SURGERY     Family History: No family history on file. Family Psychiatric  History: Denies Social History:  Social History   Substance and Sexual Activity  Alcohol Use Never  . Frequency: Never   Comment: Daily. Last drink: PTA  from [facility], Drinks beer and liquor.      Social History   Substance and Sexual Activity  Drug Use Yes  . Types: Cocaine, Benzodiazepines, Heroin, Hydrocodone   Comment: Cocaine: last used; Heroin: last used 09/17/2016;     Social History   Socioeconomic History  . Marital status: Single    Spouse name: Not on file  .  Number of children: Not on file  . Years of education: Not on file  . Highest education level: Not on file  Occupational History  . Not on file  Social Needs  . Financial resource strain: Not on file  . Food insecurity    Worry: Not on file    Inability: Not on file  . Transportation needs    Medical: Not on file    Non-medical: Not on file  Tobacco Use  . Smoking status: Current Every Day Smoker    Packs/day: 1.00    Types: Cigarettes  . Tobacco comment: Pt does not want to stop smoking  at this time  Substance and Sexual Activity  . Alcohol use: Never    Frequency: Never    Comment: Daily. Last drink: PTA  from [facility], Drinks beer and liquor.   . Drug use: Yes    Types: Cocaine, Benzodiazepines, Heroin, Hydrocodone    Comment: Cocaine: last used; Heroin: last used 09/17/2016;   . Sexual activity: Yes    Birth control/protection: Injection    Comment: Depo Provera  Lifestyle  . Physical activity    Days per week: Not on file    Minutes per session: Not on file  . Stress: Not on file  Relationships  . Social Musician on phone: Not on file    Gets together: Not on file    Attends religious service: Not on file    Active member of club or organization: Not on file    Attends meetings of clubs or organizations: Not on file    Relationship status: Not on file  Other Topics Concern  . 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  She states that she has 3 children ages 45 8 and 15.  She states that her kids currently live with her brother.  She has a significant other who recent received 28 years and is in jail.  Patient lives in [place] with her mother.    Allergies:  Not on File  Labs:  Results for orders placed or performed during the hospital encounter of 12/25/18 (from the past 48 hour(s))  Comprehensive metabolic panel     Status: Abnormal   Collection Time: 12/25/18  2:13 AM  Result Value Ref Range   Sodium 142 135 - 145 mmol/L   Potassium 3.1 (L) 3.5 - 5.1 mmol/L   Chloride 106 98 - 111 mmol/L   CO2 25 22 - 32 mmol/L   Glucose, Bld 103 (H) 70 - 99 mg/dL   BUN 16 6 - 20 mg/dL   Creatinine, Ser 8.29 0.44 - 1.00 mg/dL   Calcium 9.2 8.9 - 56.2 mg/dL   Total Protein 6.9 6.5 - 8.1 g/dL   Albumin 3.8 3.5 - 5.0 g/dL   AST 19 15 - 41 U/L   ALT 18 0 - 44 U/L   Alkaline Phosphatase 52 38 - 126 U/L   Total Bilirubin 0.3 0.3 - 1.2 mg/dL    GFR calc non Af Amer >60 >60 mL/min   GFR calc Af Amer >60 >60 mL/min   Anion gap 11 5 - 15    Comment: Performed at [facility], [address] [place], [place] [address]  cbc     Status: Abnormal   Collection Time: 12/25/18  2:13 AM  Result Value  Ref Range   WBC 12.6 (H) 4.0 - 10.5 K/uL   RBC 4.44 3.87 - 5.11 MIL/uL   Hemoglobin 11.8 (L) 12.0 - 15.0 g/dL   HCT 54.6 (L) 27.0 - 35.0 %   MCV 80.0 80.0 - 100.0 fL   MCH 26.6 26.0 - 34.0 pg   MCHC 33.2 30.0 - 36.0 g/dL   RDW 09.3 (H) 81.8 - 29.9 %   Platelets 306 150 - 400 K/uL   nRBC 0.0 0.0 - 0.2 %    Comment: Performed at [facility], [address] [place], [place] [address]  Ethanol     Status: None   Collection Time: 12/25/18  2:53 AM  Result Value Ref Range   Alcohol, Ethyl (B) <10 <10 mg/dL    Comment: (NOTE) Lowest detectable limit for serum alcohol is 10 mg/dL. For medical purposes only. Performed at [facility], [address] [place], [place] [address]   Salicylate level     Status: None   Collection Time: 12/25/18  2:53 AM  Result Value Ref Range   Salicylate Lvl <7.0 2.8 - 30.0 mg/dL    Comment: Performed at [facility], [address] [place], [place] [address]  Acetaminophen level     Status: Abnormal   Collection Time: 12/25/18  2:53 AM  Result Value Ref Range   Acetaminophen (Tylenol), Serum <10 (L) 10 - 30 ug/mL    Comment: (NOTE) Therapeutic concentrations vary significantly. A range of 10-30 ug/mL  may be an effective concentration for many patients. However, some  are best treated at concentrations outside of this range. Acetaminophen concentrations >150 ug/mL at 4 hours after ingestion  and >50 ug/mL at 12 hours after ingestion are often associated with  toxic reactions. Performed at [facility], [address], [place], [place] [address]     Current Facility-Administered Medications  Medication Dose Route Frequency Provider Last Rate Last  Dose  . albuterol (VENTOLIN HFA) 108 (90 Base) MCG/ACT inhaler 2 puff  2 puff Inhalation Q4H PRN Concha Se, MD   2 puff at 12/25/18 0943  . benztropine (COGENTIN) tablet 0.5 mg  0.5 mg Oral BID Concha Se, MD      . divalproex (DEPAKOTE) DR tablet 500 mg  500 mg Oral Q12H Concha Se, MD      . gabapentin (NEURONTIN) capsule 600 mg  600 mg Oral [id number] Concha Se, MD   600 mg at 12/25/18 8527  . hydrOXYzine (ATARAX/VISTARIL) tablet 50 mg  50 mg Oral Q6H PRN Concha Se, MD      . prazosin (MINIPRESS) capsule 3 mg  3 mg Oral QHS Concha Se, MD      . propranolol (INDERAL) tablet 20 mg  20 mg Oral BID Concha Se, MD       Current Outpatient Medications  Medication Sig Dispense Refill  . benztropine (COGENTIN) 0.5 MG tablet Take 1 tablet (0.5 mg total) by mouth 2 (two) times daily. For prevention 60 tablet 0  . divalproex (DEPAKOTE) 500 MG DR tablet Take 1 tablet (500 mg total) by mouth every 12 (twelve) hours. For mood stabilization 60 tablet 0  . gabapentin (NEURONTIN) 300 MG capsule Take 2 capsules (600 mg total) by mouth 3 (three) times daily at 8am, 3pm and bedtime. For agitation/substance withdrawal syndrome 90 capsule 0  . hydrOXYzine (ATARAX/VISTARIL) 50 MG tablet Take 1 tablet (50 mg) by mouth four times daily as needed: For anxiety 90 tablet  0  . naloxone (NARCAN) nasal spray 4 mg/0.1 mL Use for overdose of heroin/opioids. 1 each 1  . nicotine polacrilex (NICORETTE) 2 MG gum Take 1 each (2 mg total) by mouth as needed for smoking cessation. (May purchase from over the counter): For smoking cessation 100 tablet 0  . prazosin (MINIPRESS) 1 MG capsule Take 3 capsules (3 mg total) by mouth at bedtime. 90 capsule 0  . propranolol (INDERAL) 20 MG tablet Take 1 tablet (20 mg total) by mouth 2 (two) times daily. 60 tablet 0  . QUEtiapine (SEROQUEL) 300 MG tablet Take 1 tablet (300 mg total) by mouth at bedtime. For mood control 30 tablet 0  . QUEtiapine (SEROQUEL) 50 MG  tablet Take 1 tablet (50 mg total) by mouth 3 (three) times daily. For agitation/mood control 90 tablet 0    Musculoskeletal: Strength & Muscle Tone: within normal limits Gait & Station: normal Patient leans: N/A  Psychiatric Specialty Exam: Physical Exam  Review of Systems  Constitutional: Negative.   HENT: Negative.   Eyes: Negative.   Cardiovascular: Negative.   Skin: Negative.   Psychiatric/Behavioral: Positive for substance abuse. Negative for depression, hallucinations and suicidal ideas. The patient has insomnia. The patient is not nervous/anxious.     Blood pressure 114/77, pulse 96, temperature 98.5 F (36.9 C), temperature source Oral, resp. rate (!) 24, height 5\' 5"  (1.651 m), weight 59 kg, SpO2 97 %.Body mass index is 21.63 kg/m.  General Appearance: Casual  Eye Contact:  Good  Speech:  Clear and Coherent  Volume:  Normal  Mood:  Angry and Irritable  Affect:  Full Range  Thought Process:  Coherent  Orientation:  Full (Time, Place, and Person)  Thought Content:  Logical  Suicidal Thoughts:  No  Homicidal Thoughts:  No  Memory:  Recent;   Fair  Judgement:  Fair  Insight:  Lacking  Psychomotor Activity:  Normal  Concentration:  Concentration: Fair  Recall:  Fis[employer]Fair  Fund of Knowledge:  Fair  Language:  Fair  Akathisia:  No  Handed:  Right  AIMS (if indicated):     Assets:  Communication Skills Desire for Improvement Resilience  ADL's:  Intact  Cognition:  WNL  Sleep:   reportedly WNL     Treatment Plan Summary: 33 year old female with significant history of polysubstance abuse presents overdose after following an overdose in her car.  It is clear that this overdose was not intentional but rather a manifestation of the patient's drug habit.  Unfortunately at this time patient is not interested in seeking out any outpatient resources or treatment for her drug abuse problem and has little insight that her drug use is a problem.  However patient's lack of mood  symptoms or psychotic symptoms means that she does not meet criteria for psychiatric admission.  Diagnosis: Polysubstance abuse.   Disposition: No evidence of imminent risk to self or others at present.   Patient does not meet criteria for psychiatric inpatient admission. Supportive therapy provided about ongoing stressors. Discussed crisis plan, support from social network, calling 911, coming to the Emergency Department, and calling Suicide Hotline.  Clement SayresPaul A Andy Allende, MD 12/25/2018 10:53 AM

## 2018-12-25 NOTE — ED Triage Notes (Signed)
Patient brought in for IVC for opiate overdose. Patient was given 4 mg intranasal Narcan by [employer]. Patient currently sleepy.

## 2018-12-25 NOTE — ED Notes (Signed)
Pt continues to curse at staff.  Pt demanding her Methadone and for the EDP to speak with her. Maintained on 15 minute checks and observation by security camera for safety.

## 2018-12-25 NOTE — ED Notes (Signed)
Pt speaking with the psychiatrist and TTS. Maintained on 15 minute checks and observation by security camera for safety.

## 2018-12-25 NOTE — ED Notes (Signed)
Continues to rest quietly, no acute distress noted. 

## 2018-12-25 NOTE — ED Notes (Addendum)
Pt discharged home. Refused D/C VS.  Pt given D/C instructions.  Clothing and jewelry returned to patient. Pt refused to sign for D/C.  Pt denies SI/HI.

## 2018-12-25 NOTE — ED Notes (Signed)
Pt asking this Probation officer to,  "Tell the God dammed doctor to get his ass over here and speak with me."  Pt informed the doctor will see patients according to his own  schedule.  Per security officer patient has made threats to burn down the hospital. Maintained on 15 minute checks and observation by security camera for safety.

## 2018-12-25 NOTE — ED Provider Notes (Signed)
[facility] Emergency Department Provider Note  ____________________________________________  Time seen: Approximately 3:17 AM  I have reviewed the triage vital signs and the nursing notes.   HISTORY  Chief Complaint Drug Overdose  Level 5 caveat:  Portions of the history and physical were unable to be obtained due to intoxication   HPI Kathy [name] is a 33 y.o. female with h/o polysubstance abuse, IVDU, bipolar disorder, depression, hepatitis C who presents for evaluation of an overdose.  Patient is brought in by police under IVC.  Patient received 4 mg of intranasal Narcan by [employer].  Patient is extremely intoxicated unable to provide any history.  She has a GCS of 14, normal vitals, patent airway.    Past Medical History:  Diagnosis Date  . Anxiety   . Asthma   . Bipolar disorder ([place])   . Compression fracture of spine ([place])   . Fibromyalgia   . Headache   . PTSD (post-traumatic stress disorder)   . Seizures ([place])    Related to benzo withdrawal    Patient Active Problem List   Diagnosis Date Noted  . Polysubstance (excluding opioids) dependence, daily use ([place]) 11/30/2016  . Polysubstance dependence including opioid type drug, continuous use ([place]) 11/30/2016  . History of 3 spontaneous abortions 03/24/2016  . Eczema 03/24/2016  . Depression 03/24/2016  . Asthma without status asthmaticus without complication 19/41/7408  . Anxiety 03/24/2016  . Opiate abuse, continuous ([place]) 03/24/2016  . Fibromyalgia 04/17/2015  . Chronic hepatitis C virus infection ([place]) 02/28/2015  . Chronic migraine 07/23/2014  . Bipolar disorder ([place]) 05/10/2013  . PTSD (post-traumatic stress disorder) 05/10/2013  . Substance abuse ([place]) 10/03/2012    Past Surgical History:  Procedure Laterality Date  . HAND SURGERY Right   . HAND SURGERY      Prior to Admission medications   Medication Sig Start Date End Date Taking? Authorizing Provider  benztropine  (COGENTIN) 0.5 MG tablet Take 1 tablet (0.5 mg total) by mouth 2 (two) times daily. For prevention 04/04/17   Lindell Spar I, NP  divalproex (DEPAKOTE) 500 MG DR tablet Take 1 tablet (500 mg total) by mouth every 12 (twelve) hours. For mood stabilization 04/04/17   Lindell Spar I, NP  gabapentin (NEURONTIN) 300 MG capsule Take 2 capsules (600 mg total) by mouth 3 (three) times daily at 8am, 3pm and bedtime. For agitation/substance withdrawal syndrome 04/04/17   Lindell Spar I, NP  hydrOXYzine (ATARAX/VISTARIL) 50 MG tablet Take 1 tablet (50 mg) by mouth four times daily as needed: For anxiety 04/04/17   Lindell Spar I, NP  nicotine polacrilex (NICORETTE) 2 MG gum Take 1 each (2 mg total) by mouth as needed for smoking cessation. (May purchase from over the counter): For smoking cessation 04/04/17   Lindell Spar I, NP  prazosin (MINIPRESS) 1 MG capsule Take 3 capsules (3 mg total) by mouth at bedtime. 04/04/17   Niel Hummer, NP  propranolol (INDERAL) 20 MG tablet Take 1 tablet (20 mg total) by mouth 2 (two) times daily. 04/05/17   Niel Hummer, NP  QUEtiapine (SEROQUEL) 300 MG tablet Take 1 tablet (300 mg total) by mouth at bedtime. For mood control 04/04/17   Lindell Spar I, NP  QUEtiapine (SEROQUEL) 50 MG tablet Take 1 tablet (50 mg total) by mouth 3 (three) times daily. For agitation/mood control 04/04/17   Encarnacion Slates, NP    Allergies Patient has no allergy information on record.  No family history on file.  Social History Social History   Tobacco Use  . Smoking status: Current Every Day Smoker    Packs/day: 1.00    Types: Cigarettes  . Tobacco comment: Pt does not want to stop smoking at this time  Substance Use Topics  . Alcohol use: Never    Frequency: Never    Comment: Daily. [facility], Drinks beer and liquor.   . Drug use: Yes    Types: Cocaine, Benzodiazepines, Heroin, Hydrocodone    Comment: Cocaine: last used; Heroin: last used 09/17/2016;     Review of  Systems  Constitutional: Negative for fever.  ____________________________________________   PHYSICAL EXAM:  VITAL SIGNS: Vitals:   12/25/18 0630 12/25/18 0700  BP: 102/62 108/76  Pulse: 97 93  Resp: (!) 23 (!) 23  Temp:    SpO2: 99% 100%    Constitutional: Somnolent, arousable, slurred speech, intoxicated, disheveled HEENT:      Head: Normocephalic and atraumatic.         Eyes: Conjunctivae are normal. Sclera is non-icteric.       Mouth/Throat: Mucous membranes are moist.       Neck: Supple with no signs of meningismus. Cardiovascular: Regular rate and rhythm. No murmurs, gallops, or rubs. 2+ symmetrical distal pulses are present in all extremities. No JVD. Respiratory: Normal respiratory effort. Lungs are clear to auscultation bilaterally. No wheezes, crackles, or rhonchi.  Gastrointestinal: Soft, non tender, and non distended with positive bowel sounds. No rebound or guarding. Musculoskeletal: Several track marks on b/le UE Neurologic: Normal speech and language. Face is symmetric. Moving all extremities. No gross focal neurologic deficits are appreciated. Skin: Skin is warm, dry and intact. No rash noted.   ____________________________________________   LABS (all labs ordered are listed, but only abnormal results are displayed)  Labs Reviewed  COMPREHENSIVE METABOLIC PANEL - Abnormal; Notable for the following components:      Result Value   Potassium 3.1 (*)    Glucose, Bld 103 (*)    All other components within normal limits  CBC - Abnormal; Notable for the following components:   WBC 12.6 (*)    Hemoglobin 11.8 (*)    HCT 35.5 (*)    RDW 16.2 (*)    All other components within normal limits  ACETAMINOPHEN LEVEL - Abnormal; Notable for the following components:   Acetaminophen (Tylenol), Serum <10 (*)    All other components within normal limits  ETHANOL  SALICYLATE LEVEL  URINE DRUG SCREEN, QUALITATIVE ([facility] ONLY)  POC URINE PREG, ED    ____________________________________________  EKG  ED ECG REPORT I, Nita Sicklearolina Jahlisa Rossitto, the attending physician, personally viewed and interpreted this ECG.  Sinus tachycardia, rate of 101, normal intervals, normal axis, no ST elevations or depressions.  Otherwise normal EKG. ____________________________________________  RADIOLOGY  none  ____________________________________________   PROCEDURES  Procedure(s) performed: None Procedures Critical Care performed:  None ____________________________________________   INITIAL IMPRESSION / ASSESSMENT AND PLAN / ED COURSE  33 y.o. female with h/o polysubstance abuse, IVDU, bipolar disorder, depression, hepatitis C who presents for evaluation of a opiate overdose requiring narcan in the field. GCS 14, disheveled, intoxicated, slurring her speech, track marks. Breathing normally, normal sats and RR. EKG with normal interval, no ischemia or dysrhythmias.  Patient connected to telemetry machine for close monitoring. Labs for medical clearance are pending.  Clinical Course as of Dec 24 724  [facility]Mon Dec 25, 2018  [id number] Reassessment: patient very drowsy and somnolent. O2 dropping to upper 80% when she  is asleep, improves on 2 L. Normal RR and hypoxia resolves when awake. Will continue to monitor   [CV]  0726 Reassessment: Patient still very intoxicated but improving. Care transferred to Dr. Fuller Plan   [CV]    Clinical Course User Index [CV] Don Perking Washington, MD      As part of my medical decision making, I reviewed the following data within the electronic MEDICAL RECORD NUMBER Nursing notes reviewed and incorporated, Labs reviewed , EKG interpreted , Old chart reviewed, Notes from prior ED visits and [place] Controlled Substance Database   Patient was evaluated in Emergency Department today for the symptoms described in the history of present illness. Patient was evaluated in the context of the global COVID-19 pandemic, which necessitated consideration  that the patient might be at risk for infection with the SARS-CoV-2 virus that causes COVID-19. Institutional protocols and algorithms that pertain to the evaluation of patients at risk for COVID-19 are in a state of rapid change based on information released by regulatory bodies including the CDC and federal and state organizations. These policies and algorithms were followed during the patient's care in the ED.   ____________________________________________   FINAL CLINICAL IMPRESSION(S) / ED DIAGNOSES   Final diagnoses:  Opiate overdose, undetermined intent, initial encounter (HCC)      NEW MEDICATIONS STARTED DURING THIS VISIT:  ED Discharge Orders    None       Note:  This document was prepared using Dragon voice recognition software and may include unintentional dictation errors.    Nita Sickle, MD 12/25/18 [phone]

## 2018-12-25 NOTE — Discharge Instructions (Signed)
You presented from a opioid overdose which was life-threatening.  We have prescribed naloxone to use in the future if you are friend overdoses.  You are seen by the psychiatric services and cleared to go home.

## 2018-12-25 NOTE — ED Notes (Addendum)
Pt stating this hospital needs to be burned down and the staff has done nothing to help her.  Pt continues to curse while standing in her doorway.  RN spoke with patient's father and would like to speak with the psychiatrist if possible. Maintained on 15 minute checks and observation by security camera for safety.  Father:  [phone] Mother: [phone]

## 2018-12-25 NOTE — ED Notes (Signed)
Patient to ED via [facility] under IVC for opiate overdose.  On arrival to ED patient mumbling and not make much sense.  Patient to toilet reports needing to have BM.  When questioned patient states she didn't "use" anything.  Patient lethargic and needs constant reminders to help staff take clothing off and start treatment.

## 2018-12-25 NOTE — ED Notes (Signed)
Pt upset we do not have all of the belongings she claims she had with her at the time she was picked up by [employer].

## 2018-12-25 NOTE — ED Notes (Signed)
Returned to inform pt that doctor will order her asthma medications but not the methadone and explained same reason as stated before.  Pt again wanting to leave.  Told her waiting for psych to come around as they make the decision of if she is able to leave or not.  Pt states "call the psychiatrist and tell him to get his ass down here".  Explained to pt that RN is not able to call the psychiatrist and tell him this.  Pt yanking monitor equipment off.  Security at doorway.  Pt moved into room 22.  Amy RN given report on pt.  Remains angry and yelling as moving.

## 2018-12-25 NOTE — ED Notes (Signed)
RN in patients room.  Patient awake and begins yelling at this RN about wanting to leave.  Explained that the police took out IVC papers and she is unable to leave at this time.  Pt requesting home meds.  Informed her will talk to doctor about them.  Pt states "what about my methadone, I go to the methadone clinic".  Discussed with patient that will ask doctor but they will most likely not give her the methadone after being here all night after an overdose where police had to narcan her.  Asking for phone.  Informed patient the phone hours start at 9 am and will get her a phone as soon as they do.

## 2018-12-25 NOTE — ED Notes (Signed)
Patient resting quietly, no acute distress noted. °

## 2018-12-25 NOTE — ED Notes (Signed)
Patient placed on cardiac monitor and placed in position of comfort.

## 2018-12-25 NOTE — ED Provider Notes (Signed)
IVC was rescinded by the psychiatric team.  Patient will be discharged home.    Vanessa Parker, MD 12/25/18 1038

## 2019-01-09 ENCOUNTER — Emergency Department
Admission: EM | Admit: 2019-01-09 | Discharge: 2019-01-10 | Disposition: A | Discharge status: Home / Self Care | Payer: Self-pay | Attending: Emergency Medicine | Admitting: Emergency Medicine

## 2019-01-09 ENCOUNTER — Other Ambulatory Visit: Payer: Self-pay

## 2019-01-09 ENCOUNTER — Emergency Department: Payer: Self-pay

## 2019-01-09 ENCOUNTER — Encounter: Payer: Self-pay | Admitting: Emergency Medicine

## 2019-01-09 DIAGNOSIS — Z8742 Personal history of other diseases of the female genital tract: Secondary | ICD-10-CM

## 2019-01-09 DIAGNOSIS — F411 Generalized anxiety disorder: Secondary | ICD-10-CM | POA: Diagnosis present

## 2019-01-09 DIAGNOSIS — L309 Dermatitis, unspecified: Secondary | ICD-10-CM | POA: Diagnosis present

## 2019-01-09 DIAGNOSIS — F29 Unspecified psychosis not due to a substance or known physiological condition: Secondary | ICD-10-CM | POA: Insufficient documentation

## 2019-01-09 DIAGNOSIS — IMO0002 Reserved for concepts with insufficient information to code with codable children: Secondary | ICD-10-CM | POA: Diagnosis present

## 2019-01-09 DIAGNOSIS — F32A Depression, unspecified: Secondary | ICD-10-CM | POA: Diagnosis present

## 2019-01-09 DIAGNOSIS — F419 Anxiety disorder, unspecified: Secondary | ICD-10-CM | POA: Diagnosis present

## 2019-01-09 DIAGNOSIS — J45909 Unspecified asthma, uncomplicated: Secondary | ICD-10-CM | POA: Diagnosis present

## 2019-01-09 DIAGNOSIS — F319 Bipolar disorder, unspecified: Secondary | ICD-10-CM | POA: Diagnosis present

## 2019-01-09 DIAGNOSIS — F1410 Cocaine abuse, uncomplicated: Secondary | ICD-10-CM | POA: Insufficient documentation

## 2019-01-09 DIAGNOSIS — Z8759 Personal history of other complications of pregnancy, childbirth and the puerperium: Secondary | ICD-10-CM

## 2019-01-09 DIAGNOSIS — F17210 Nicotine dependence, cigarettes, uncomplicated: Secondary | ICD-10-CM | POA: Insufficient documentation

## 2019-01-09 DIAGNOSIS — F329 Major depressive disorder, single episode, unspecified: Secondary | ICD-10-CM | POA: Diagnosis present

## 2019-01-09 DIAGNOSIS — F4310 Post-traumatic stress disorder, unspecified: Secondary | ICD-10-CM | POA: Diagnosis present

## 2019-01-09 DIAGNOSIS — T40601A Poisoning by unspecified narcotics, accidental (unintentional), initial encounter: Secondary | ICD-10-CM | POA: Diagnosis present

## 2019-01-09 DIAGNOSIS — G43709 Chronic migraine without aura, not intractable, without status migrainosus: Secondary | ICD-10-CM | POA: Diagnosis present

## 2019-01-09 DIAGNOSIS — F1910 Other psychoactive substance abuse, uncomplicated: Secondary | ICD-10-CM | POA: Diagnosis present

## 2019-01-09 DIAGNOSIS — B182 Chronic viral hepatitis C: Secondary | ICD-10-CM | POA: Diagnosis present

## 2019-01-09 DIAGNOSIS — M797 Fibromyalgia: Secondary | ICD-10-CM | POA: Diagnosis present

## 2019-01-09 DIAGNOSIS — F1920 Other psychoactive substance dependence, uncomplicated: Secondary | ICD-10-CM | POA: Diagnosis present

## 2019-01-09 DIAGNOSIS — Z79899 Other long term (current) drug therapy: Secondary | ICD-10-CM | POA: Insufficient documentation

## 2019-01-09 DIAGNOSIS — F1110 Opioid abuse, uncomplicated: Secondary | ICD-10-CM | POA: Diagnosis present

## 2019-01-09 LAB — COMPREHENSIVE METABOLIC PANEL
ALT: 16 U/L (ref 0–44)
AST: 23 U/L (ref 15–41)
Albumin: 3.4 g/dL — ABNORMAL LOW (ref 3.5–5.0)
Alkaline Phosphatase: 51 U/L (ref 38–126)
Anion gap: 10 (ref 5–15)
BUN: 14 mg/dL (ref 6–20)
CO2: 23 mmol/L (ref 22–32)
Calcium: 8.7 mg/dL — ABNORMAL LOW (ref 8.9–10.3)
Chloride: 105 mmol/L (ref 98–111)
Creatinine, Ser: 0.51 mg/dL (ref 0.44–1.00)
GFR calc Af Amer: >60 mL/min (ref >60)
GFR calc non Af Amer: >60 mL/min (ref >60)
Glucose, Bld: 90 mg/dL (ref 70–99)
Potassium: 3.4 mmol/L — ABNORMAL LOW (ref 3.5–5.1)
Sodium: 138 mmol/L (ref 135–145)
Total Bilirubin: 0.8 mg/dL (ref 0.3–1.2)
Total Protein: 6.7 g/dL (ref 6.5–8.1)

## 2019-01-09 LAB — CBC
HCT: 32.4 % — ABNORMAL LOW (ref 36.0–46.0)
Hemoglobin: 10.6 g/dL — ABNORMAL LOW (ref 12.0–15.0)
MCH: 26.6 pg (ref 26.0–34.0)
MCHC: 32.7 g/dL (ref 30.0–36.0)
MCV: 81.4 fL (ref 80.0–100.0)
Platelets: 277 10*3/uL (ref 150–400)
RBC: 3.98 MIL/uL (ref 3.87–5.11)
RDW: 16.4 % — ABNORMAL HIGH (ref 11.5–15.5)
WBC: 8.9 10*3/uL (ref 4.0–10.5)
nRBC: 0 % (ref 0.0–0.2)

## 2019-01-09 LAB — ACETAMINOPHEN LEVEL: Acetaminophen (Tylenol), Serum: <10 ug/mL — ABNORMAL LOW (ref 10–30)

## 2019-01-09 LAB — HCG, QUANTITATIVE, PREGNANCY: hCG, Beta Chain, Quant, S: 428 m[IU]/mL — ABNORMAL HIGH (ref <5)

## 2019-01-09 LAB — SALICYLATE LEVEL: Salicylate Lvl: <7 mg/dL (ref 2.8–30.0)

## 2019-01-09 LAB — ETHANOL: Alcohol, Ethyl (B): <10 mg/dL (ref <10)

## 2019-01-09 MED ORDER — LORAZEPAM 2 MG/ML IJ SOLN
2.0000 mg | Freq: Once | INTRAMUSCULAR | Status: AC
  Administered 2019-01-09: 12:00:00 2 mg via INTRAMUSCULAR
  Filled 2019-01-09: qty 1

## 2019-01-09 MED ORDER — HALOPERIDOL LACTATE 5 MG/ML IJ SOLN
5.0000 mg | Freq: Once | INTRAMUSCULAR | Status: AC
  Administered 2019-01-09: 12:00:00 5 mg via INTRAMUSCULAR
  Filled 2019-01-09: qty 1

## 2019-01-09 NOTE — ED Notes (Signed)
Pt dressed out by this Probation officer and NT.  Belongings include: black tank top Black bra Slip on shoes Gray leggings Green jacket 3 silver rings 4 silver bracelets  Black hair tie Black headband Elastic band  Unable to remove: I ring I nose ring 2 chest piercings

## 2019-01-09 NOTE — ED Notes (Signed)
Pt given IM injections  to help calm patient and gain her cooperation. Pt dressed out after medications took effect. Maintained on 15 minute checks and observation by security camera for safety.

## 2019-01-09 NOTE — ED Notes (Addendum)
RN entered bathroom after hearing patient yell. Pt was found on the floor. RN and NT assisted patient back to bed.  No obvious injury. EDP and charge nurse made aware. VS stable. CT scan of head ordered.

## 2019-01-09 NOTE — Discharge Instructions (Addendum)
Your beta hCG was elevated at 429.  This possibly represents a pregnancy.  However if you develop abdominal pain or vaginal bleeding you should return for ultrasound to ensure there is no ectopic pregnancy.  You should follow-up with OB/GYN for this.  Dr. Nechama Guard is on-call she will follow-up with you I have provided her contact information

## 2019-01-09 NOTE — ED Notes (Signed)
Pt earrings removed, placed in specimen cup and placed in patient's belongings.

## 2019-01-09 NOTE — ED Notes (Addendum)
Patient arrives to room 21 in handcuffs. Pt is paranoid and hallucinating. Continuously pointing to people that are not present. Maintained on 15 minute checks and observation by security camera for safety.

## 2019-01-09 NOTE — ED Triage Notes (Signed)
Arrives to ED under IVC for ED evaluation.  Per [employer], patient is hallucinating.  Has history of opiate abuse.  AAOx3.  Skin warm and dry. Denies SI/ HI.  Patient is restless.  Mumbles at times.

## 2019-01-09 NOTE — ED Notes (Signed)
NT attempted lab draw. Unable to obtain blood at this time.

## 2019-01-09 NOTE — ED Provider Notes (Signed)
[facility]  [facility] Emergency Department Provider Note   ____________________________________________   First MD Initiated Contact with Patient 01/09/19 1133     (approximate)  I have reviewed the triage vital signs and the nursing notes.   HISTORY  Chief Complaint No chief complaint on file.  Chief complaint is altered mental status history is limited by altered mental status  HPI Kathy [name] is a 33 y.o. female patient is obviously responding to internal stimuli.  She is walking around looking at things as though there were more than blank walls.  She seems to be paranoid and backing away from people.  She is talking clearly although not making a lot of sense and keeps asking what is behind the wall with behind the wall and is not excepting her explanations.  I cannot get any history out of her.  She does have a history of opioid abuse, polysubstance abuse and bipolar disease.  She is walking without any difficulty even with her hands handcuffed.         Past Medical History:  Diagnosis Date  . Anxiety   . Asthma   . Bipolar disorder (HCC)   . Compression fracture of spine (HCC)   . Fibromyalgia   . Headache   . PTSD (post-traumatic stress disorder)   . Seizures (HCC)    Related to benzo withdrawal    Patient Active Problem List   Diagnosis Date Noted  . Opiate overdose (HCC)   . Polysubstance (excluding opioids) dependence, daily use (HCC) 11/30/2016  . Polysubstance dependence including opioid type drug, continuous use (HCC) 11/30/2016  . History of 3 spontaneous abortions 03/24/2016  . Eczema 03/24/2016  . Depression 03/24/2016  . Asthma without status asthmaticus without complication 03/24/2016  . Anxiety 03/24/2016  . Opiate abuse, continuous (HCC) 03/24/2016  . Fibromyalgia 04/17/2015  . Chronic hepatitis C virus infection (HCC) 02/28/2015  . Chronic migraine 07/23/2014  . Bipolar disorder (HCC) 05/10/2013  . PTSD (post-traumatic  stress disorder) 05/10/2013  . Polysubstance abuse (HCC) 10/03/2012    Past Surgical History:  Procedure Laterality Date  . HAND SURGERY Right   . HAND SURGERY      Prior to Admission medications   Medication Sig Start Date End Date Taking? Authorizing Provider  benztropine (COGENTIN) 0.5 MG tablet Take 1 tablet (0.5 mg total) by mouth 2 (two) times daily. For prevention 04/04/17   Armandina StammerNwoko, Agnes I, NP  divalproex (DEPAKOTE) 500 MG DR tablet Take 1 tablet (500 mg total) by mouth every 12 (twelve) hours. For mood stabilization 04/04/17   Armandina StammerNwoko, Agnes I, NP  gabapentin (NEURONTIN) 300 MG capsule Take 2 capsules (600 mg total) by mouth 3 (three) times daily at 8am, 3pm and bedtime. For agitation/substance withdrawal syndrome 04/04/17   Armandina StammerNwoko, Agnes I, NP  hydrOXYzine (ATARAX/VISTARIL) 50 MG tablet Take 1 tablet (50 mg) by mouth four times daily as needed: For anxiety 04/04/17   Armandina StammerNwoko, Agnes I, NP  naloxone [facility](NARCAN) nasal spray 4 mg/0.1 mL Use for overdose of heroin/opioids. 12/25/18   Concha SeFunke, Mary E, MD  nicotine polacrilex (NICORETTE) 2 MG gum Take 1 each (2 mg total) by mouth as needed for smoking cessation. (May purchase from over the counter): For smoking cessation 04/04/17   Armandina StammerNwoko, Agnes I, NP  prazosin (MINIPRESS) 1 MG capsule Take 3 capsules (3 mg total) by mouth at bedtime. 04/04/17   Thermon Leylandavis, Laura A, NP  propranolol (INDERAL) 20 MG tablet Take 1 tablet (20 mg total) by mouth 2 (  two) times daily. 04/05/17   Thermon Leyland, NP  QUEtiapine (SEROQUEL) 300 MG tablet Take 1 tablet (300 mg total) by mouth at bedtime. For mood control 04/04/17   Armandina Stammer I, NP  QUEtiapine (SEROQUEL) 50 MG tablet Take 1 tablet (50 mg total) by mouth 3 (three) times daily. For agitation/mood control 04/04/17   Sanjuana Kava, NP    Allergies Patient has no known allergies.  No family history on file.  Social History Social History   Tobacco Use  . Smoking status: Current Every Day Smoker    Packs/day: 1.00     Types: Cigarettes  . Smokeless tobacco: Never Used  . Tobacco comment: Pt does not want to stop smoking at this time  Substance Use Topics  . Alcohol use: Never    Frequency: Never    Comment: Daily. Last drink: PTA  from [facility], Drinks beer and liquor.   . Drug use: Yes    Types: Cocaine, Benzodiazepines, Heroin, Hydrocodone    Comment: Cocaine: last used; Heroin: last used 09/17/2016;     Review of Systems  Constitutional: No fever/chills Eyes: No visual changes. ENT: No sore throat. Cardiovascular: Denies chest pain. Respiratory: Denies shortness of breath. Gastrointestinal: No abdominal pain.  No nausea, no vomiting.  No diarrhea.  No constipation. Genitourinary: Negative for dysuria. Musculoskeletal: Negative for back pain. Skin: Negative for rash. Neurological: Negative for headaches, focal weakness   ____________________________________________   PHYSICAL EXAM:  VITAL SIGNS: ED Triage Vitals [01/09/19 1121]  Enc Vitals Group     BP      Pulse      Resp      Temp      Temp src      SpO2      Weight 130 lb 1.1 oz (59 kg)     Height 5\' 3"  (1.6 m)     Head Circumference      Peak Flow      Pain Score 0     Pain Loc      Pain Edu?      Excl. in GC?     Constitutional: Alert uncertain if oriented. Well appearing and in no acute distress. Eyes: Conjunctivae are normal. PER. EOMI. Head: Atraumatic. Nose: No congestion/rhinnorhea. Mouth/Throat: Mucous membranes are moist.  Oropharynx non-erythematous. Neck: No stridor. Cardiovascular: Normal rate, regular rhythm. Grossly normal heart sounds.  Good peripheral circulation. Respiratory: Normal respiratory effort.  No retractions. Lungs CTAB. Gastrointestinal: Soft and nontender. No distention. No abdominal bruits. No CVA tenderness. Musculoskeletal: No lower extremity tenderness nor edema.   Neurologic:  Normal speech and language in quality but not content. No gross focal neurologic deficits are appreciated. No  gait instability. Skin:  Skin is warm, dry and intact. No rash noted.  ____________________________________________   LABS (all labs ordered are listed, but only abnormal results are displayed)  Labs Reviewed  COMPREHENSIVE METABOLIC PANEL  ETHANOL  CBC  URINE DRUG SCREEN, QUALITATIVE ([facility] ONLY)  ACETAMINOPHEN LEVEL  SALICYLATE LEVEL  HCG, QUANTITATIVE, PREGNANCY  POC URINE PREG, ED   ____________________________________________  EKG   ____________________________________________  RADIOLOGY  ED MD interpretation:    Official radiology report(s): CT read by radiology reviewed by me shows no obvious fracture of the C-spine or acute intracranial pathology there is opacification of the sinuses. Ct Head Wo Contrast  Result Date: 01/09/2019 CLINICAL DATA:  The patient fell. Hallucinations. EXAM: CT HEAD WITHOUT CONTRAST TECHNIQUE: Contiguous axial images were obtained from the base of the skull  through the vertex without intravenous contrast. COMPARISON:  None. FINDINGS: Brain: No evidence of acute infarction, hemorrhage, hydrocephalus, extra-axial collection or mass lesion/mass effect. There is slight asymmetry of the lateral ventricles which is likely developmental. Vascular: No hyperdense vessel or unexpected calcification. Skull: Normal. Negative for fracture or focal lesion. Sinuses/Orbits: Extensive partial opacification of the left side of the frontal sinus, left maxillary sinus and ethmoid air cells. Mastoid air cells and middle ear cavities are clear. Other: None IMPRESSION: 1. No acute intracranial abnormality. Slight asymmetry of the lateral ventricles which is likely developmental. 2. Extensive partial opacification of the left side of the paranasal sinuses. Electronically Signed   By: Lorriane Shire M.D.   On: 01/09/2019 15:03   Ct Cervical Spine Wo Contrast  Result Date: 01/09/2019 CLINICAL DATA:  Fall.  Hallucinations. EXAM: CT CERVICAL SPINE WITHOUT CONTRAST  TECHNIQUE: Multidetector CT imaging of the cervical spine was performed without intravenous contrast. Multiplanar CT image reconstructions were also generated. COMPARISON:  CT cervical spine dated November 26, 2011. FINDINGS: Despite efforts by the technologist and patient, motion artifact is present on today's exam and could not be eliminated. This reduces exam sensitivity and specificity. Alignment: Normal. Skull base and vertebrae: No acute fracture. No primary bone lesion or focal pathologic process. Soft tissues and spinal canal: No prevertebral fluid or swelling. No visible canal hematoma. Disc levels:  Normal. Upper chest: Negative. Other: None. IMPRESSION: 1. Motion limited exam.  No definite acute cervical spine fracture. Electronically Signed   By: Titus Dubin M.D.   On: 01/09/2019 15:11    ____________________________________________   PROCEDURES  Procedure(s) performed (including Critical Care):  Procedures   ____________________________________________   INITIAL IMPRESSION / ASSESSMENT AND PLAN / ED COURSE Patient will not sit down will not let us do anything with her.  We will give her some Ativan and Haldol IM in an attempt to sedate her slightly so we can remove the handcuffs and change her out as well as get labs    ----------------------------------------- 2:06 PM on 01/09/2019 -----------------------------------------  Patient had been sedated and lying in the bed got up and went to the bathroom fell and hit her head on the rail in the bathtub room.  We will have CT her head and her neck since she is kind of still out of it from sedation.          ____________________________________________   FINAL CLINICAL IMPRESSION(S) / ED DIAGNOSES  Final diagnoses:  Psychosis, unspecified psychosis type [facility])     ED Discharge Orders    None       Note:  This document was prepared using Dragon voice recognition software and may include unintentional dictation  errors.    Nena Polio, MD 01/09/19 [phone]

## 2019-01-09 NOTE — ED Provider Notes (Signed)
5:40 PM Assumed care for off going team.   Blood pressure 96/65, pulse 90, temperature 98.2 F (36.8 C), temperature source Oral, resp. rate 18, height 5\' 3"  (1.6 m), weight 59 kg, SpO2 100 %.  See their HPI for full report but in brief patient was handed off pending labs.  Patient's hCG was positive at 428 which make her back [redacted] weeks pregnant.  Patient still sedated from the medication some earlier so unable to get a good history.  At this time we will plan on getting a repeat 48-hour hCG.   Reevaluated patient who is now more awake.  Patient says she is unclear when her last menstrual period was.  She did know that she could be pregnant.  She denies any abdominal pain or vaginal bleeding.  She says that she has had 3 prior children as well as a miscarriage.  Denies any prior ectopic pregnancies.  Patient aware that she needs to follow-up on a repeat hCG or return to ED for abd pain, vaginal bleeding.          Vanessa [place], MD 01/09/19 2105

## 2019-01-10 ENCOUNTER — Other Ambulatory Visit: Payer: Self-pay | Admitting: Obstetrics and Gynecology

## 2019-01-10 ENCOUNTER — Telehealth: Payer: Self-pay | Admitting: Obstetrics and Gynecology

## 2019-01-10 DIAGNOSIS — F1910 Other psychoactive substance abuse, uncomplicated: Secondary | ICD-10-CM

## 2019-01-10 LAB — HCG, QUANTITATIVE, PREGNANCY: hCG, Beta Chain, Quant, S: 737 m[IU]/mL — ABNORMAL HIGH (ref <5)

## 2019-01-10 MED ORDER — DIVALPROEX SODIUM 250 MG PO DR TAB
250.0000 mg | DELAYED_RELEASE_TABLET | Freq: Three times a day (TID) | ORAL | Status: DC
  Administered 2019-01-10: 10:00:00 250 mg via ORAL
  Filled 2019-01-10: qty 1

## 2019-01-10 MED ORDER — GABAPENTIN 300 MG PO CAPS
600.0000 mg | ORAL_CAPSULE | ORAL | Status: DC
  Administered 2019-01-10: 09:00:00 600 mg via ORAL
  Filled 2019-01-10: qty 2

## 2019-01-10 NOTE — ED Notes (Signed)
Patient up to bathroom for am care. Requesting her Neurontin. Also states does not take Depakote. Shared with writer she never did meth before and she guesses she was brought into ed last night because she was belligerent. Patient stated ex heroin uses clean for 2 years and is taking sabaxton for her heroin withdrawals.

## 2019-01-10 NOTE — ED Notes (Signed)
Pt given cup of apple juice

## 2019-01-10 NOTE — Telephone Encounter (Signed)
-----   Message from Homero Fellers, MD sent at 01/10/2019  1:47 PM EST ----- Please offer this patient a NOB visit in the next 4 weeks.  Thank you,  Dr. Gilman Schmidt

## 2019-01-10 NOTE — Telephone Encounter (Signed)
Called left generic message for patient to call back.

## 2019-01-10 NOTE — ED Notes (Signed)
Patient discharged to home belongings given

## 2019-01-10 NOTE — BH Assessment (Signed)
Assessment Note  Kathy [name] is an 33 y.o. female who presents to the ER due to responding to internal stimuli, induced by her substance use. Per the patient, she believed someone "laced" her drugs with another substance. As result, she was brought to the ER because of her behaviors and mental status.  During the interview, the patient was calm, cooperative and pleasant. She was able to provided appropriate answers to the questions. She denies history of aggression and violence.  Diagnosis: Substance Use Disorder  Past Medical History:  Past Medical History:  Diagnosis Date  . Anxiety   . Asthma   . Bipolar disorder ([place])   . Compression fracture of spine ([place])   . Fibromyalgia   . Headache   . PTSD (post-traumatic stress disorder)   . Seizures ([place])    Related to benzo withdrawal    Past Surgical History:  Procedure Laterality Date  . HAND SURGERY Right   . HAND SURGERY      Family History: No family history on file.  Social History:  reports that she has been smoking cigarettes. She has been smoking about 1.00 pack per day. She has never used smokeless tobacco. She reports current drug use. Drugs: Cocaine, Benzodiazepines, Heroin, and Hydrocodone. She reports that she does not drink alcohol.  Additional Social History:  Alcohol / Drug Use Pain Medications: See PTA Prescriptions: See PTA Over the Counter: See PTA History of alcohol / drug use?: Yes Substance #1 Name of Substance 1: Cocaine Substance #2 Name of Substance 2: Alcohol Substance #3 Name of Substance 3: Methamphetamine Substance #4 Name of Substance 4: Heroin  CIWA: CIWA-Ar BP: (!) 119/91 Pulse Rate: 99 COWS:    Allergies: No Known Allergies  Home Medications: (Not in a hospital admission)   OB/GYN Status:  No LMP recorded (lmp unknown).  General Assessment Data Location of Assessment: [facility] ED TTS Assessment: In system Is this a Tele or Face-to-Face Assessment?: Face-to-Face Is this an  Initial Assessment or a Re-assessment for this encounter?: Initial Assessment Patient Accompanied by:: N/A Language Other than English: No Living Arrangements: Other (Comment)(Private Home) What gender do you identify as?: Female Marital status: Single Maiden name: n/a Pregnancy Status: No Living Arrangements: Parent(Lives with mother) Can pt return to current living arrangement?: Yes Admission Status: Involuntary Petitioner: ED Attending Is patient capable of signing voluntary admission?: No(Under IVC) Referral Source: Self/Family/Friend Insurance type: None  Medical Screening Exam [facility] Walk-in ONLY) Medical Exam completed: Yes  Crisis Care Plan Living Arrangements: Parent(Lives with mother) Legal Guardian: Other:(Self) Name of Psychiatrist: Reports of none Name of Therapist: Reports of none  Education Status Is patient currently in school?: No Is the patient employed, unemployed or receiving disability?: Unemployed  Risk to self with the past 6 months Suicidal Ideation: No Has patient been a risk to self within the past 6 months prior to admission? : No Suicidal Intent: No Has patient had any suicidal intent within the past 6 months prior to admission? : No Is patient at risk for suicide?: No Suicidal Plan?: No Has patient had any suicidal plan within the past 6 months prior to admission? : No Access to Means: No What has been your use of drugs/alcohol within the last 12 months?: Cocaine, alcohol, methamphetamine & heroin Previous Attempts/Gestures: No How many times?: 0 Other Self Harm Risks: Active drug addiction Triggers for Past Attempts: None known Intentional Self Injurious Behavior: None Family Suicide History: Unknown Recent stressful life event(s): Other (Comment), Conflict (Comment) Persecutory voices/beliefs?: No  Depression: Yes Depression Symptoms: Isolating, Guilt, Feeling worthless/self pity Substance abuse history and/or treatment for substance  abuse?: Yes Suicide prevention information given to non-admitted patients: Not applicable  Risk to Others within the past 6 months Homicidal Ideation: No Does patient have any lifetime risk of violence toward others beyond the six months prior to admission? : No Thoughts of Harm to Others: No Current Homicidal Intent: No Current Homicidal Plan: No Access to Homicidal Means: No Identified Victim: Reports of none History of harm to others?: No Assessment of Violence: None Noted Violent Behavior Description: Reports of none Does patient have access to weapons?: No Criminal Charges Pending?: No Does patient have a court date: No Is patient on probation?: No  Psychosis Hallucinations: None noted Delusions: None noted  Mental Status Report Appearance/Hygiene: Unremarkable, In scrubs Eye Contact: Good Motor Activity: Freedom of movement, Unremarkable Speech: Logical/coherent, Unremarkable Level of Consciousness: Alert Mood: Anxious, Sad, Pleasant Affect: Appropriate to circumstance, Depressed, Sad Anxiety Level: Minimal Thought Processes: Coherent, Relevant Judgement: Unimpaired Orientation: Person, Place, Time, Situation, Appropriate for developmental age Obsessive Compulsive Thoughts/Behaviors: None  Cognitive Functioning Concentration: Normal Memory: Recent Intact, Remote Intact Is patient IDD: No Insight: Fair Impulse Control: Fair Appetite: Fair Have you had any weight changes? : No Change Sleep: No Change Total Hours of Sleep: 7 Vegetative Symptoms: None  ADLScreening [facility] Assessment Services) Patient's cognitive ability adequate to safely complete daily activities?: Yes Patient able to express need for assistance with ADLs?: Yes Independently performs ADLs?: Yes (appropriate for developmental age)  Prior Inpatient Therapy Prior Inpatient Therapy: Yes Prior Therapy Dates: 03/2017, 11/2016 & 03/2016 Prior Therapy Facilty/Provider(s): [facility] Reason  for Treatment: Depression and Substance Use D/O  Prior Outpatient Therapy Prior Outpatient Therapy: No Does patient have an ACCT team?: No Does patient have Intensive In-House Services?  : No Does patient have [facility] services? : No Does patient have P4CC services?: No  ADL Screening (condition at time of admission) Patient's cognitive ability adequate to safely complete daily activities?: Yes Is the patient deaf or have difficulty hearing?: No Does the patient have difficulty concentrating, remembering, or making decisions?: No Patient able to express need for assistance with ADLs?: Yes Does the patient have difficulty dressing or bathing?: No Independently performs ADLs?: Yes (appropriate for developmental age) Does the patient have difficulty walking or climbing stairs?: No Weakness of Legs: None Weakness of Arms/Hands: None  Home Assistive Devices/Equipment Home Assistive Devices/Equipment: None  Therapy Consults (therapy consults require a physician order) PT Evaluation Needed: No OT Evalulation Needed: No SLP Evaluation Needed: No Abuse/Neglect Assessment (Assessment to be complete while patient is alone) Abuse/Neglect Assessment Can Be Completed: Yes Physical Abuse: Denies Verbal Abuse: Denies Sexual Abuse: Denies Exploitation of patient/patient's resources: Denies Self-Neglect: Denies Values / Beliefs Cultural Requests During Hospitalization: None Spiritual Requests During Hospitalization: None Consults Spiritual Care Consult Needed: No Social Work Consult Needed: No Merchant navy officer (For Healthcare) Does Patient Have a Medical Advance Directive?: No Would patient like information on creating a medical advance directive?: No - Patient declined       Child/Adolescent Assessment Running Away Risk: Denies(Patient is an adult)  Disposition:  Disposition Initial Assessment Completed for this Encounter: Yes  On Site Evaluation by:   Reviewed with Physician:     Lilyan Gilford MS, LCAS, [facility], NCC Therapeutic Triage Specialist 01/10/2019 11:18 AM

## 2019-01-10 NOTE — ED Notes (Signed)
Dr. Claris Gower at bedside to assess patient.

## 2019-01-10 NOTE — BH Assessment (Signed)
Per request of Psych MD (Christafano), writer provided the pt. with information and instructions on how to access [facility] [place] ([facility] [employer])   Patient denies SI/HI and AV/H. ___________________ [facility] [address]  [place], [place] [address] [phone]  [facility] [address]  [place], [place] [address] [phone]

## 2019-01-10 NOTE — ED Notes (Signed)
Pt states she is supposed to be taking Gabapentin 900mg  TID, states it has been almost a whole day since she has had any gabapentin.  Pt states she is supposed to be on other medications, but she doesn't take them.

## 2019-01-10 NOTE — ED Notes (Signed)
Assumed care of patient, patient sleeping comfortably, awaiting re-eval this morning for further plan of care. Breakfast to be served upon receipt. Safety maintained. Will monitor.

## 2019-01-10 NOTE — Consult Note (Signed)
[facility] Face-to-Face Psychiatry Consult   Reason for Consult: Paranoia behaviors Referring Physician: Dr. Juliette AlcideMelinda Patient Identification: Kathy [name] MRN:  [id number] Principal Diagnosis: Polysubstance abuse [facility](HCC) Diagnosis:  Principal Problem:   Polysubstance abuse (HCC) Active Problems:   Fibromyalgia   History of 3 spontaneous abortions   Eczema   Depression   Chronic migraine   Chronic hepatitis C virus infection (HCC)   Bipolar disorder (HCC)   Asthma without status asthmaticus without complication   Anxiety   PTSD (post-traumatic stress disorder)   Opiate abuse, continuous (HCC)   Polysubstance (excluding opioids) dependence, daily use (HCC)   Opiate overdose (HCC)   Total Time spent with patient: 30 minutes  Subjective: "They said I was having a psychotic episode." Kathy [name] is a 33 y.o. female patient who [facility] ED via law enforcement under involuntary commitment status (IVC).  The patient was brought in due to her experiencing internal stimuli.  The patient voiced she was at a hotel with some friends and was brought in due to her experiencing psychosis from a substance that she had used earlier.  During her assessment, she was alert, clear, and able to participate in the assessment process appropriately.  Substance abuse treatment was discussed with the patient.  She voiced that she does need help but is not receptive to receiving any help.  The patient denies currently being on psychiatric medication.  The patient has never been psychiatrically hospitalized before.  The patient was also made aware by the EDP that her hCG level is 428 mIU/ML (questionable positive pregnancy test).   The patient was seen face-to-face by this provider; chart reviewed and consulted with Dr. Fuller PlanFunke on 01/09/2019 due to the patient's care. It was discussed with the EDP that the patient does not meet criteria to be admitted to the inpatient unit.  The patient is alert and oriented x 4,  calm, cooperative, and mood-congruent with affect on evaluation. The patient does not appear to be responding to internal or external stimuli. Neither is the patient presenting with any delusional thinking. The patient denies auditory or visual hallucinations. The patient denies any suicidal, homicidal, or self-harm ideations. The patient is not presenting with any psychotic or paranoid behaviors. During an encounter with the patient, she was able to answer questions appropriately.  Collateral was not obtained. This provider made several attempts to contact Ms. Kathy [name] ([phone]), leaving a HIPPA appropriate voicemail message.  Plan: The patient is not a safety risk to self or others and does not require psychiatric inpatient admission for stabilization and treatment.  Once collateral can be obtained the patient meets criteria to be discharged to home.  HPI: Per Dr. Juliette AlcideMelinda; Kathy [name] is a 33 y.o. female patient is obviously responding to internal stimuli.  She is walking around looking at things as though there were more than blank walls.  She seems to be paranoid and backing away from people.  She is talking clearly although not making a lot of sense and keeps asking what is behind the wall with behind the wall and is not excepting her explanations.  I cannot get any history out of her.  She does have a history of opioid abuse, polysubstance abuse and bipolar disease.  She is walking without any difficulty even with her hands handcuffed  Past Psychiatric History:  Anxiety Bipolar disorder (HCC) PTSD post (traumatic stress disorder)  Risk to Self:  No Risk to Others:  No Prior Inpatient Therapy:  No Prior Outpatient Therapy:  Yes  Past Medical History:  Past Medical History:  Diagnosis Date  . Anxiety   . Asthma   . Bipolar disorder (HCC)   . Compression fracture of spine (HCC)   . Fibromyalgia   . Headache   . PTSD (post-traumatic stress disorder)   . Seizures (HCC)     Related to benzo withdrawal    Past Surgical History:  Procedure Laterality Date  . HAND SURGERY Right   . HAND SURGERY     Family History: No family history on file. Family Psychiatric  History: Denies Social History:  Social History   Substance and Sexual Activity  Alcohol Use Never  . Frequency: Never   Comment: Daily. Last drink: PTA  from [facility], Drinks beer and liquor.      Social History   Substance and Sexual Activity  Drug Use Yes  . Types: Cocaine, Benzodiazepines, Heroin, Hydrocodone   Comment: Cocaine: last used; Heroin: last used 09/17/2016;     Social History   Socioeconomic History  . Marital status: Single    Spouse name: Not on file  . Number of children: Not on file  . Years of education: Not on file  . Highest education level: Not on file  Occupational History  . Not on file  Social Needs  . Financial resource strain: Not on file  . Food insecurity    Worry: Not on file    Inability: Not on file  . Transportation needs    Medical: Not on file    Non-medical: Not on file  Tobacco Use  . Smoking status: Current Every Day Smoker    Packs/day: 1.00    Types: Cigarettes  . Smokeless tobacco: Never Used  . Tobacco comment: Pt does not want to stop smoking at this time  Substance and Sexual Activity  . Alcohol use: Never    Frequency: Never    Comment: Daily. Last drink: PTA  from [facility], Drinks beer and liquor.   . Drug use: Yes    Types: Cocaine, Benzodiazepines, Heroin, Hydrocodone    Comment: Cocaine: last used; Heroin: last used 09/17/2016;   . Sexual activity: Yes    Birth control/protection: Injection    Comment: Depo Provera  Lifestyle  . Physical activity    Days per week: Not on file    Minutes per session: Not on file  . Stress: Not on file  Relationships  . Social Musician on phone: Not on file    Gets together: Not on file    Attends religious service: Not on file    Active member of club or organization: Not on file     Attends meetings of clubs or organizations: Not on file    Relationship status: Not on file  Other Topics Concern  . 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  She states that she has 3 children ages 60 8 and 3.  She states that her kids currently live with her brother.  She has a significant other who recent received 28 years and is in jail.  Patient lives in [place] with her mother.    Allergies:  No Known Allergies  Labs:  Results for orders placed or performed during the hospital encounter of 01/09/19 (from the past 48 hour(s))  Acetaminophen level     Status: Abnormal  Collection Time: 01/09/19 11:33 AM  Result Value Ref Range   Acetaminophen (Tylenol), Serum <10 (L) 10 - 30 ug/mL    Comment: (NOTE) Therapeutic concentrations vary significantly. A range of 10-30 ug/mL  may be an effective concentration for many patients. However, some  are best treated at concentrations outside of this range. Acetaminophen concentrations >150 ug/mL at 4 hours after ingestion  and >50 ug/mL at 12 hours after ingestion are often associated with  toxic reactions. Performed at [facility], [address] [place], [place] [address]   Salicylate level     Status: None   Collection Time: 01/09/19 11:33 AM  Result Value Ref Range   Salicylate Lvl <7.0 2.8 - 30.0 mg/dL    Comment: Performed at [facility], [address] [place], [place] [address]  Comprehensive metabolic panel     Status: Abnormal   Collection Time: 01/09/19  3:27 PM  Result Value Ref Range   Sodium 138 135 - 145 mmol/L   Potassium 3.4 (L) 3.5 - 5.1 mmol/L   Chloride 105 98 - 111 mmol/L   CO2 23 22 - 32 mmol/L   Glucose, Bld 90 70 - 99 mg/dL   BUN 14 6 - 20 mg/dL   Creatinine, Ser 8.14 0.44 - 1.00 mg/dL   Calcium 8.7 (L) 8.9 - 10.3 mg/dL   Total Protein 6.7 6.5 - 8.1 g/dL   Albumin  3.4 (L) 3.5 - 5.0 g/dL   AST 23 15 - 41 U/L   ALT 16 0 - 44 U/L   Alkaline Phosphatase 51 38 - 126 U/L   Total Bilirubin 0.8 0.3 - 1.2 mg/dL   GFR calc non Af Amer >60 >60 mL/min   GFR calc Af Amer >60 >60 mL/min   Anion gap 10 5 - 15    Comment: Performed at [facility], [address] [place], [place] [address]  Ethanol     Status: None   Collection Time: 01/09/19  3:27 PM  Result Value Ref Range   Alcohol, Ethyl (B) <10 <10 mg/dL    Comment: (NOTE) Lowest detectable limit for serum alcohol is 10 mg/dL. For medical purposes only. Performed at [facility], [address] [place], [place] [address]   cbc     Status: Abnormal   Collection Time: 01/09/19  3:27 PM  Result Value Ref Range   WBC 8.9 4.0 - 10.5 K/uL   RBC 3.98 3.87 - 5.11 MIL/uL   Hemoglobin 10.6 (L) 12.0 - 15.0 g/dL   HCT 70.2 (L) 63.7 - 85.8 %   MCV 81.4 80.0 - 100.0 fL   MCH 26.6 26.0 - 34.0 pg   MCHC 32.7 30.0 - 36.0 g/dL   RDW 85.0 (H) 27.7 - 41.2 %   Platelets 277 150 - 400 K/uL   nRBC 0.0 0.0 - 0.2 %    Comment: Performed at [facility], [address] [place], [place] [address]  hCG, quantitative, pregnancy     Status: Abnormal   Collection Time: 01/09/19  3:46 PM  Result Value Ref Range   hCG, Beta Chain, Quant, S 428 (H) <5 mIU/mL    Comment:          GEST. AGE      CONC.  (mIU/mL)   <=1 WEEK        5 - 50     2 WEEKS       50 - 500     3 WEEKS  100 - 10,000     4 WEEKS     1,000 - 30,000     5 WEEKS     3,500 - 115,000   6-8 WEEKS     12,000 - 270,000    12 WEEKS     15,000 - 220,000        FEMALE AND NON-PREGNANT FEMALE:     LESS THAN 5 mIU/mL Performed at [facility], [address] [place], [place] [address]     No current facility-administered medications for this encounter.    Current Outpatient Medications  Medication Sig Dispense Refill  . ALPRAZolam (XANAX) 1 MG tablet Take 2 mg by mouth 4 (four) times daily.    . benztropine  (COGENTIN) 0.5 MG tablet Take 1 tablet (0.5 mg total) by mouth 2 (two) times daily. For prevention 60 tablet 0  . diazepam (VALIUM) 10 MG tablet Take 20 mg by mouth 2 (two) times daily.    . divalproex (DEPAKOTE) 250 MG DR tablet Take 250 mg by mouth 3 (three) times daily.    Marland Kitchen gabapentin (NEURONTIN) 300 MG capsule Take 2 capsules (600 mg total) by mouth 3 (three) times daily at 8am, 3pm and bedtime. For agitation/substance withdrawal syndrome 90 capsule 0  . hydrOXYzine (ATARAX/VISTARIL) 50 MG tablet Take 1 tablet (50 mg) by mouth four times daily as needed: For anxiety 90 tablet 0  . naloxone (NARCAN) nasal spray 4 mg/0.1 mL Use for overdose of heroin/opioids. 1 each 1  . nicotine polacrilex (NICORETTE) 2 MG gum Take 1 each (2 mg total) by mouth as needed for smoking cessation. (May purchase from over the counter): For smoking cessation 100 tablet 0  . prazosin (MINIPRESS) 1 MG capsule Take 3 capsules (3 mg total) by mouth at bedtime. 90 capsule 0  . propranolol (INDERAL) 20 MG tablet Take 1 tablet (20 mg total) by mouth 2 (two) times daily. 60 tablet 0  . QUEtiapine (SEROQUEL) 300 MG tablet Take 1 tablet (300 mg total) by mouth at bedtime. For mood control 30 tablet 0  . QUEtiapine (SEROQUEL) 50 MG tablet Take 1 tablet (50 mg total) by mouth 3 (three) times daily. For agitation/mood control 90 tablet 0    Musculoskeletal: Strength & Muscle Tone: within normal limits Gait & Station: normal Patient leans: N/A  Psychiatric Specialty Exam: Physical Exam  Nursing note and vitals reviewed. Constitutional: She is oriented to person, place, and time.  Neck: Normal range of motion. Neck supple.  Cardiovascular: Normal rate.  Respiratory: Effort normal.  Musculoskeletal: Normal range of motion.  Neurological: She is alert and oriented to person, place, and time.  Psychiatric: Judgment and thought content normal.    Review of Systems  Constitutional: Negative.   HENT: Negative.   Eyes:  Negative.   Cardiovascular: Negative.   Skin: Negative.   Psychiatric/Behavioral: Positive for substance abuse. The patient has insomnia.   All other systems reviewed and are negative.   Blood pressure 96/65, pulse 90, temperature 98.2 F (36.8 C), temperature source Oral, resp. rate 18, height 5\' 3"  (1.6 m), weight 59 kg, SpO2 100 %.Body mass index is 23.04 kg/m.  General Appearance: Casual  Eye Contact:  Good  Speech:  Clear and Coherent  Volume:  Normal  Mood:  Euthymic  Affect:  Appropriate, Congruent and Flat  Thought Process:  Coherent  Orientation:  Full (Time, Place, and Person)  Thought Content:  WDL and Logical  Suicidal Thoughts:  No  Homicidal Thoughts:  No  Memory:  Recent;   Fair  Judgement:  Fair  Insight:  Good  Psychomotor Activity:  Normal  Concentration:  Concentration: Fair  Recall:  [employer] of Knowledge:  Fair  Language:  Fair  Akathisia:  No  Handed:  Right  AIMS (if indicated):     Assets:  Communication Skills Desire for Improvement Resilience  ADL's:  Intact  Cognition:  WNL  Sleep:   reportedly WNL     Treatment Plan Summary: Patient does not meet criteria for psychiatric admission.  Diagnosis: Polysubstance abuse.   Disposition: No evidence of imminent risk to self or others at present.   Patient does not meet criteria for psychiatric inpatient admission. Supportive therapy provided about ongoing stressors. Discussed crisis plan, support from social network, calling 911, coming to the Emergency Department, and calling Suicide Hotline.  Gillermo Murdoch, NP 01/10/2019 1:49 AM

## 2019-01-10 NOTE — Consult Note (Signed)
[facility] Face-to-Face Psychiatry Consult   Reason for Consult: Paranoia behaviors Referring Physician: Dr. Rip Harbour Patient Identification: Larisa Lanius MRN:  [id number] Principal Diagnosis: Polysubstance abuse [facility]) Diagnosis:  Principal Problem:   Polysubstance abuse ([place]) Active Problems:   Fibromyalgia   History of 3 spontaneous abortions   Eczema   Depression   Chronic migraine   Chronic hepatitis C virus infection ([place])   Bipolar disorder ([place])   Asthma without status asthmaticus without complication   Anxiety   PTSD (post-traumatic stress disorder)   Opiate abuse, continuous ([place])   Polysubstance (excluding opioids) dependence, daily use ([place])   Opiate overdose ([place])     Patient reevaluated on morning of 01-10-2019: Corroborated the below information is accurate.  She still denies suicidal ideation or homicidal ideation this morning.  She denies psychosis no psychosis is evidenced.  Patient given outpatient resources to follow-up for drug treatment.  IVC will be rescinded patient will be discharged to community.     Total Time spent with patient: 30 minutes  Subjective: "They said I was having a psychotic episode." Baylen Dea is a 33 y.o. female patient who presented to [facility] ED via law enforcement under involuntary commitment status (IVC).  The patient was brought in due to her experiencing internal stimuli.  The patient voiced she was at a hotel with some friends and was brought in due to her experiencing psychosis from a substance that she had used earlier.  During her assessment, she was alert, clear, and able to participate in the assessment process appropriately.  Substance abuse treatment was discussed with the patient.  She voiced that she does need help but is not receptive to receiving any help.  The patient denies currently being on psychiatric medication.  The patient has never been psychiatrically hospitalized before.  The patient was also made aware by the EDP  that her hCG level is 428 mIU/ML (questionable positive pregnancy test).   The patient was seen face-to-face by this provider; chart reviewed and consulted with Dr. Jari Pigg on 01/09/2019 due to the patient's care. It was discussed with the EDP that the patient does not meet criteria to be admitted to the inpatient unit.  The patient is alert and oriented x 4, calm, cooperative, and mood-congruent with affect on evaluation. The patient does not appear to be responding to internal or external stimuli. Neither is the patient presenting with any delusional thinking. The patient denies auditory or visual hallucinations. The patient denies any suicidal, homicidal, or self-harm ideations. The patient is not presenting with any psychotic or paranoid behaviors. During an encounter with the patient, she was able to answer questions appropriately.  Collateral was not obtained. This provider made several attempts to contact Ms. Hallie Ertl ([phone]), leaving a HIPPA appropriate voicemail message.  Plan: The patient is not a safety risk to self or others and does not require psychiatric inpatient admission for stabilization and treatment.  Once collateral can be obtained the patient meets criteria to be discharged to home.  HPI: Per Dr. Rip Harbour; Mecca Guitron is a 33 y.o. female patient is obviously responding to internal stimuli.  She is walking around looking at things as though there were more than blank walls.  She seems to be paranoid and backing away from people.  She is talking clearly although not making a lot of sense and keeps asking what is behind the wall with behind the wall and is not excepting her explanations.  I cannot get any history out of her.  She  does have a history of opioid abuse, polysubstance abuse and bipolar disease.  She is walking without any difficulty even with her hands handcuffed  Past Psychiatric History:  Anxiety Bipolar disorder (HCC) PTSD post (traumatic stress  disorder)  Risk to Self:  No Risk to Others:  No Prior Inpatient Therapy:  No Prior Outpatient Therapy:  Yes  Past Medical History:  Past Medical History:  Diagnosis Date  . Anxiety   . Asthma   . Bipolar disorder (HCC)   . Compression fracture of spine (HCC)   . Fibromyalgia   . Headache   . PTSD (post-traumatic stress disorder)   . Seizures (HCC)    Related to benzo withdrawal    Past Surgical History:  Procedure Laterality Date  . HAND SURGERY Right   . HAND SURGERY     Family History: No family history on file. Family Psychiatric  History: Denies Social History:  Social History   Substance and Sexual Activity  Alcohol Use Never  . Frequency: Never   Comment: Daily. Last drink: PTA  from [facility], Drinks beer and liquor.      Social History   Substance and Sexual Activity  Drug Use Yes  . Types: Cocaine, Benzodiazepines, Heroin, Hydrocodone   Comment: Cocaine: last used; Heroin: last used 09/17/2016;     Social History   Socioeconomic History  . Marital status: Single    Spouse name: Not on file  . Number of children: Not on file  . Years of education: Not on file  . Highest education level: Not on file  Occupational History  . Not on file  Social Needs  . Financial resource strain: Not on file  . Food insecurity    Worry: Not on file    Inability: Not on file  . Transportation needs    Medical: Not on file    Non-medical: Not on file  Tobacco Use  . Smoking status: Current Every Day Smoker    Packs/day: 1.00    Types: Cigarettes  . Smokeless tobacco: Never Used  . Tobacco comment: Pt does not want to stop smoking at this time  Substance and Sexual Activity  . Alcohol use: Never    Frequency: Never    Comment: Daily. Last drink: PTA  from [facility], Drinks beer and liquor.   . Drug use: Yes    Types: Cocaine, Benzodiazepines, Heroin, Hydrocodone    Comment: Cocaine: last used; Heroin: last used 09/17/2016;   . Sexual activity: Yes    Birth  control/protection: Injection    Comment: Depo Provera  Lifestyle  . Physical activity    Days per week: Not on file    Minutes per session: Not on file  . Stress: Not on file  Relationships  . Social Musician on phone: Not on file    Gets together: Not on file    Attends religious service: Not on file    Active member of club or organization: Not on file    Attends meetings of clubs or organizations: Not on file    Relationship status: Not on file  Other Topics Concern  . 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  She states that she has 3 children ages 91 8 and 10.  She states that her kids currently live with her brother.  She has a  significant other who recent received 28 years and is in jail.  Patient lives in [place] with her mother.    Allergies:  No Known Allergies  Labs:  Results for orders placed or performed during the hospital encounter of 01/09/19 (from the past 48 hour(s))  Acetaminophen level     Status: Abnormal   Collection Time: 01/09/19 11:33 AM  Result Value Ref Range   Acetaminophen (Tylenol), Serum <10 (L) 10 - 30 ug/mL    Comment: (NOTE) Therapeutic concentrations vary significantly. A range of 10-30 ug/mL  may be an effective concentration for many patients. However, some  are best treated at concentrations outside of this range. Acetaminophen concentrations >150 ug/mL at 4 hours after ingestion  and >50 ug/mL at 12 hours after ingestion are often associated with  toxic reactions. Performed at [facility], [address] [place], [place] [address]   Salicylate level     Status: None   Collection Time: 01/09/19 11:33 AM  Result Value Ref Range   Salicylate Lvl <7.0 2.8 - 30.0 mg/dL    Comment: Performed at [facility], [address] [place], [place] [address]  Comprehensive metabolic panel      Status: Abnormal   Collection Time: 01/09/19  3:27 PM  Result Value Ref Range   Sodium 138 135 - 145 mmol/L   Potassium 3.4 (L) 3.5 - 5.1 mmol/L   Chloride 105 98 - 111 mmol/L   CO2 23 22 - 32 mmol/L   Glucose, Bld 90 70 - 99 mg/dL   BUN 14 6 - 20 mg/dL   Creatinine, Ser 0.980.51 0.44 - 1.00 mg/dL   Calcium 8.7 (L) 8.9 - 10.3 mg/dL   Total Protein 6.7 6.5 - 8.1 g/dL   Albumin 3.4 (L) 3.5 - 5.0 g/dL   AST 23 15 - 41 U/L   ALT 16 0 - 44 U/L   Alkaline Phosphatase 51 38 - 126 U/L   Total Bilirubin 0.8 0.3 - 1.2 mg/dL   GFR calc non Af Amer >60 >60 mL/min   GFR calc Af Amer >60 >60 mL/min   Anion gap 10 5 - 15    Comment: Performed at [facility], [address] [place], [place] [address]  Ethanol     Status: None   Collection Time: 01/09/19  3:27 PM  Result Value Ref Range   Alcohol, Ethyl (B) <10 <10 mg/dL    Comment: (NOTE) Lowest detectable limit for serum alcohol is 10 mg/dL. For medical purposes only. Performed at [facility], [address] [place], [place] [address]   cbc     Status: Abnormal   Collection Time: 01/09/19  3:27 PM  Result Value Ref Range   WBC 8.9 4.0 - 10.5 K/uL   RBC 3.98 3.87 - 5.11 MIL/uL   Hemoglobin 10.6 (L) 12.0 - 15.0 g/dL   HCT 62.132.4 (L) 30.836.0 - 65.746.0 %   MCV 81.4 80.0 - 100.0 fL   MCH 26.6 26.0 - 34.0 pg   MCHC 32.7 30.0 - 36.0 g/dL   RDW 84.616.4 (H) 96.211.5 - 95.215.5 %   Platelets 277 150 - 400 K/uL   nRBC 0.0 0.0 - 0.2 %    Comment: Performed at [facility], [address] [place], [place] [address]  hCG, quantitative, pregnancy     Status: Abnormal   Collection Time: 01/09/19  3:46 PM  Result Value Ref Range   hCG, Beta Chain, Quant, S 428 (H) <5 mIU/mL  Comment:          GEST. AGE      CONC.  (mIU/mL)   <=1 WEEK        5 - 50     2 WEEKS       50 - 500     3 WEEKS       100 - 10,000     4 WEEKS     1,000 - 30,000     5 WEEKS     3,500 - 115,000   6-8 WEEKS     12,000 - 270,000    12 WEEKS     15,000 -  220,000        FEMALE AND NON-PREGNANT FEMALE:     LESS THAN 5 mIU/mL Performed at [facility], [address] [place], [place] [address]     Current Facility-Administered Medications  Medication Dose Route Frequency Provider Last Rate Last Dose  . divalproex (DEPAKOTE) DR tablet 250 mg  250 mg Oral TID Nita Sickle, MD   250 mg at 01/10/19 0956  . gabapentin (NEURONTIN) capsule 600 mg  600 mg Oral Pat Patrick, [place], MD   600 mg at 01/10/19 6045   Current Outpatient Medications  Medication Sig Dispense Refill  . ALPRAZolam (XANAX) 1 MG tablet Take 2 mg by mouth 4 (four) times daily.    . benztropine (COGENTIN) 0.5 MG tablet Take 1 tablet (0.5 mg total) by mouth 2 (two) times daily. For prevention 60 tablet 0  . diazepam (VALIUM) 10 MG tablet Take 20 mg by mouth 2 (two) times daily.    . divalproex (DEPAKOTE) 250 MG DR tablet Take 250 mg by mouth 3 (three) times daily.    Marland Kitchen gabapentin (NEURONTIN) 300 MG capsule Take 2 capsules (600 mg total) by mouth 3 (three) times daily at 8am, 3pm and bedtime. For agitation/substance withdrawal syndrome 90 capsule 0  . hydrOXYzine (ATARAX/VISTARIL) 50 MG tablet Take 1 tablet (50 mg) by mouth four times daily as needed: For anxiety 90 tablet 0  . naloxone (NARCAN) nasal spray 4 mg/0.1 mL Use for overdose of heroin/opioids. 1 each 1  . nicotine polacrilex (NICORETTE) 2 MG gum Take 1 each (2 mg total) by mouth as needed for smoking cessation. (May purchase from over the counter): For smoking cessation 100 tablet 0  . prazosin (MINIPRESS) 1 MG capsule Take 3 capsules (3 mg total) by mouth at bedtime. 90 capsule 0  . propranolol (INDERAL) 20 MG tablet Take 1 tablet (20 mg total) by mouth 2 (two) times daily. 60 tablet 0  . QUEtiapine (SEROQUEL) 300 MG tablet Take 1 tablet (300 mg total) by mouth at bedtime. For mood control 30 tablet 0  . QUEtiapine (SEROQUEL) 50 MG tablet Take 1 tablet (50 mg total) by mouth 3 (three) times daily.  For agitation/mood control 90 tablet 0    Musculoskeletal: Strength & Muscle Tone: within normal limits Gait & Station: normal Patient leans: N/A  Psychiatric Specialty Exam: Physical Exam  Nursing note and vitals reviewed. Constitutional: She is oriented to person, place, and time.  Neck: Normal range of motion. Neck supple.  Cardiovascular: Normal rate.  Respiratory: Effort normal.  Musculoskeletal: Normal range of motion.  Neurological: She is alert and oriented to person, place, and time.  Psychiatric: Judgment and thought content normal.    Review of Systems  Constitutional: Negative.   HENT: Negative.   Eyes: Negative.   Cardiovascular: Negative.   Skin: Negative.   Psychiatric/Behavioral:  Positive for substance abuse. The patient has insomnia.   All other systems reviewed and are negative.   Blood pressure (!) 119/91, pulse 99, temperature 97.6 F (36.4 C), temperature source Oral, resp. rate 18, height  (1.6 m), weight 59 kg, SpO2 100 %.Body mass index is 23.04 kg/m.  General Appearance: Casual  Eye Contact:  Good  Speech:  Clear and Coherent  Volume:  Normal  Mood:  Euthymic  Affect:  Appropriate, Congruent and Flat  Thought Process:  Coherent  Orientation:  Full (Time, Place, and Person)  Thought Content:  WDL and Logical  Suicidal Thoughts:  No  Homicidal Thoughts:  No  Memory:  Recent;   Fair  Judgement:  Fair  Insight:  Good  Psychomotor Activity:  Normal  Concentration:  Concentration: Fair  Recall:  [employer] of Knowledge:  Fair  Language:  Fair  Akathisia:  No  Handed:  Right  AIMS (if indicated):     Assets:  Communication Skills Desire for Improvement Resilience  ADL's:  Intact  Cognition:  WNL  Sleep:   reportedly WNL     Treatment Plan Summary: Patient does not meet criteria for psychiatric admission.  Diagnosis: Polysubstance abuse.   Disposition: No evidence of imminent risk to self or others at present.   Patient does  not meet criteria for psychiatric inpatient admission. Supportive therapy provided about ongoing stressors. Discussed crisis plan, support from social network, calling 911, coming to the Emergency Department, and calling Suicide Hotline.  Clement Sayres, MD 01/10/2019 10:30 AM

## 2019-01-10 NOTE — ED Notes (Signed)
Labs drawn and sent -

## 2019-01-15 NOTE — Telephone Encounter (Signed)
Called and left voice mail for patient to call back to be schedule °

## 2019-01-22 NOTE — Telephone Encounter (Signed)
Voicemail is full unable to leave message for patient

## 2019-02-16 NOTE — L&D Delivery Note (Signed)
Delivery Note  Kathy [name] is a O2V0350 at [redacted]w[redacted]d with an unknown LMP, dated by [place] at [redacted]w[redacted]d.  First Stage: Labor onset: 0950 08/11/2019 Augmentation : None Analgesia Eliezer Lofts intrapartum: IVPM pPROM at 0950  Second Stage: Complete dilation at 1448 Onset of pushing at 1450 FHR second stage: 135bpm, moderate variability, intermittent variables   Kathy [name] presented to L&D with complaints of LOF followed by intermittent abdominal cramping.  On admission she was grossly ruptured with clear fluid and in PTL.  She has had intermittent visits to local Emergency Departments but had not established care with a local provider.  Multiple attempts were made to place IV and obtain labs.  Unable to initiate GBS prophylaxis d/t difficult IV placement.  By the time an IV was able to be placed in her foot, she was in transition.  SCN was called to delivery room for immanent birth.  Kathy [name] had a strong urge to push and quickly delivered a viable baby girl.   Delivery of a viable baby girl 08/11/2019 at 1455 by CNM Delivery of fetal head in OA position with restitution to LOT. No nuchal cord;  Anterior then posterior shoulders delivered easily with gentle downward traction. Baby placed on mom's abdomen d/t short umbilical cord, and attended to by peds. Cord double clamped after cessation of pulsation, cut by CNM  Cord blood sample collected  Third Stage: Oxytocin bolus started after delivery of infant for hemorrhage prophylaxis  Placenta delivered intact with 3 VC @ 1509 Placenta disposition: to lab Uterine tone firm / bleeding moderate   Multiple abrasions identified - hemostatic, not repaired  Anesthesia for repair: N/A Repair None Est. Blood Loss (mL): 200 ml   Complications: GBS pos - not adequately treated   Mom to postpartum.  Baby to NICU.  Newborn: Birth Weight: 5lbs 3.6oz, 2370g  Apgar Scores: 8/9 Feeding planned: formula   ---------- Margaretmary Eddy, CNM Certified Nurse Midwife [facility]

## 2019-03-28 ENCOUNTER — Encounter: Payer: Self-pay | Admitting: Radiology

## 2019-03-28 ENCOUNTER — Emergency Department
Admission: EM | Admit: 2019-03-28 | Discharge: 2019-03-28 | Disposition: A | Discharge status: Home / Self Care | Payer: Self-pay | Attending: Emergency Medicine | Admitting: Emergency Medicine

## 2019-03-28 ENCOUNTER — Other Ambulatory Visit: Payer: Self-pay

## 2019-03-28 ENCOUNTER — Emergency Department: Payer: Self-pay

## 2019-03-28 DIAGNOSIS — F1910 Other psychoactive substance abuse, uncomplicated: Secondary | ICD-10-CM | POA: Insufficient documentation

## 2019-03-28 DIAGNOSIS — F1510 Other stimulant abuse, uncomplicated: Secondary | ICD-10-CM | POA: Insufficient documentation

## 2019-03-28 DIAGNOSIS — H5711 Ocular pain, right eye: Secondary | ICD-10-CM | POA: Insufficient documentation

## 2019-03-28 DIAGNOSIS — Z3A01 Less than 8 weeks gestation of pregnancy: Secondary | ICD-10-CM | POA: Insufficient documentation

## 2019-03-28 DIAGNOSIS — R600 Localized edema: Secondary | ICD-10-CM | POA: Insufficient documentation

## 2019-03-28 DIAGNOSIS — F1110 Opioid abuse, uncomplicated: Secondary | ICD-10-CM | POA: Insufficient documentation

## 2019-03-28 DIAGNOSIS — Z20822 Contact with and (suspected) exposure to covid-19: Secondary | ICD-10-CM | POA: Insufficient documentation

## 2019-03-28 DIAGNOSIS — F319 Bipolar disorder, unspecified: Secondary | ICD-10-CM | POA: Insufficient documentation

## 2019-03-28 DIAGNOSIS — Z3491 Encounter for supervision of normal pregnancy, unspecified, first trimester: Secondary | ICD-10-CM

## 2019-03-28 DIAGNOSIS — R4182 Altered mental status, unspecified: Secondary | ICD-10-CM | POA: Insufficient documentation

## 2019-03-28 DIAGNOSIS — O26891 Other specified pregnancy related conditions, first trimester: Secondary | ICD-10-CM | POA: Insufficient documentation

## 2019-03-28 DIAGNOSIS — Z79899 Other long term (current) drug therapy: Secondary | ICD-10-CM | POA: Insufficient documentation

## 2019-03-28 LAB — COMPREHENSIVE METABOLIC PANEL
ALT: 115 U/L — ABNORMAL HIGH (ref 0–44)
AST: 127 U/L — ABNORMAL HIGH (ref 15–41)
Albumin: 3.4 g/dL — ABNORMAL LOW (ref 3.5–5.0)
Alkaline Phosphatase: 83 U/L (ref 38–126)
Anion gap: 10 (ref 5–15)
BUN: 8 mg/dL (ref 6–20)
CO2: 26 mmol/L (ref 22–32)
Calcium: 9.4 mg/dL (ref 8.9–10.3)
Chloride: 100 mmol/L (ref 98–111)
Creatinine, Ser: 0.59 mg/dL (ref 0.44–1.00)
GFR calc Af Amer: >60 mL/min (ref >60)
GFR calc non Af Amer: >60 mL/min (ref >60)
Glucose, Bld: 92 mg/dL (ref 70–99)
Potassium: 3.6 mmol/L (ref 3.5–5.1)
Sodium: 136 mmol/L (ref 135–145)
Total Bilirubin: 0.4 mg/dL (ref 0.3–1.2)
Total Protein: 8.7 g/dL — ABNORMAL HIGH (ref 6.5–8.1)

## 2019-03-28 LAB — URINALYSIS, COMPLETE (UACMP) WITH MICROSCOPIC
Bilirubin Urine: NEGATIVE
Glucose, UA: NEGATIVE mg/dL
Hgb urine dipstick: NEGATIVE
Ketones, ur: NEGATIVE mg/dL
Leukocytes,Ua: NEGATIVE
Nitrite: NEGATIVE
Protein, ur: NEGATIVE mg/dL
Specific Gravity, Urine: 1.012 (ref 1.005–1.030)
pH: 7 (ref 5.0–8.0)

## 2019-03-28 LAB — CBC WITH DIFFERENTIAL/PLATELET
Abs Immature Granulocytes: 0.03 10*3/uL (ref 0.00–0.07)
Basophils Absolute: 0 10*3/uL (ref 0.0–0.1)
Basophils Relative: 0 %
Eosinophils Absolute: 0.5 10*3/uL (ref 0.0–0.5)
Eosinophils Relative: 7 %
HCT: 36.6 % (ref 36.0–46.0)
Hemoglobin: 11.9 g/dL — ABNORMAL LOW (ref 12.0–15.0)
Immature Granulocytes: 0 %
Lymphocytes Relative: 23 %
Lymphs Abs: 1.6 10*3/uL (ref 0.7–4.0)
MCH: 26.6 pg (ref 26.0–34.0)
MCHC: 32.5 g/dL (ref 30.0–36.0)
MCV: 81.7 fL (ref 80.0–100.0)
Monocytes Absolute: 0.5 10*3/uL (ref 0.1–1.0)
Monocytes Relative: 7 %
Neutro Abs: 4.4 10*3/uL (ref 1.7–7.7)
Neutrophils Relative %: 63 %
Platelets: 308 10*3/uL (ref 150–400)
RBC: 4.48 MIL/uL (ref 3.87–5.11)
RDW: 14.6 % (ref 11.5–15.5)
WBC: 7.1 10*3/uL (ref 4.0–10.5)
nRBC: 0 % (ref 0.0–0.2)

## 2019-03-28 LAB — URINE DRUG SCREEN, QUALITATIVE (ARMC ONLY)
Amphetamines, Ur Screen: POSITIVE — AB
Barbiturates, Ur Screen: NOT DETECTED
Benzodiazepine, Ur Scrn: POSITIVE — AB
Cannabinoid 50 Ng, Ur ~~LOC~~: NOT DETECTED
Cocaine Metabolite,Ur ~~LOC~~: POSITIVE — AB
MDMA (Ecstasy)Ur Screen: NOT DETECTED
Methadone Scn, Ur: NOT DETECTED
Opiate, Ur Screen: POSITIVE — AB
Phencyclidine (PCP) Ur S: NOT DETECTED
Tricyclic, Ur Screen: NOT DETECTED

## 2019-03-28 LAB — RESPIRATORY PANEL BY RT PCR (FLU A&B, COVID)
Influenza A by PCR: NEGATIVE
Influenza B by PCR: NEGATIVE
SARS Coronavirus 2 by RT PCR: NEGATIVE

## 2019-03-28 LAB — SALICYLATE LEVEL: Salicylate Lvl: <7 mg/dL — ABNORMAL LOW (ref 7.0–30.0)

## 2019-03-28 LAB — ETHANOL: Alcohol, Ethyl (B): <10 mg/dL (ref <10)

## 2019-03-28 LAB — ACETAMINOPHEN LEVEL: Acetaminophen (Tylenol), Serum: <10 ug/mL — ABNORMAL LOW (ref 10–30)

## 2019-03-28 LAB — LIPASE, BLOOD: Lipase: 20 U/L (ref 11–51)

## 2019-03-28 LAB — HCG, QUANTITATIVE, PREGNANCY: hCG, Beta Chain, Quant, S: 95190 m[IU]/mL — ABNORMAL HIGH (ref <5)

## 2019-03-28 MED ORDER — FOLIC ACID 5 MG/ML IJ SOLN
1.0000 mg | Freq: Every day | INTRAMUSCULAR | Status: DC
  Administered 2019-03-28: 1 mg via INTRAVENOUS
  Filled 2019-03-28: qty 0.2

## 2019-03-28 MED ORDER — PRENATAL PLUS 27-1 MG PO TABS
1.0000 | ORAL_TABLET | Freq: Every day | ORAL | Status: DC
  Administered 2019-03-28: 1 via ORAL
  Filled 2019-03-28: qty 1

## 2019-03-28 MED ORDER — SODIUM CHLORIDE 0.9 % IV BOLUS
500.0000 mL | Freq: Once | INTRAVENOUS | Status: AC
  Administered 2019-03-28: 500 mL via INTRAVENOUS

## 2019-03-28 MED ORDER — ALBUTEROL SULFATE HFA 108 (90 BASE) MCG/ACT IN AERS: 2.0000 | INHALATION_SPRAY | Freq: Four times a day (QID) | RESPIRATORY_TRACT | 0 refills | Status: DC | PRN

## 2019-03-28 MED ORDER — CEPHALEXIN 500 MG PO CAPS
500.0000 mg | ORAL_CAPSULE | Freq: Four times a day (QID) | ORAL | Status: DC
  Administered 2019-03-28: 500 mg via ORAL
  Filled 2019-03-28 (×2): qty 1

## 2019-03-28 MED ORDER — IOHEXOL 300 MG/ML  SOLN
75.0000 mL | Freq: Once | INTRAMUSCULAR | Status: AC | PRN
  Administered 2019-03-28: 75 mL via INTRAVENOUS

## 2019-03-28 NOTE — ED Notes (Signed)
Pt resting comfortably at this time.  Stretcher locked in lowest position. Call bell within reach.

## 2019-03-28 NOTE — ED Notes (Signed)
EDP at bedside  

## 2019-03-28 NOTE — ED Notes (Signed)
Spoke to pt mother who is out in the parking. Informed that pt is not ready to be DC yet but that is the plan. Informed mom to wait until papers are finished being written.   Pt has eaten 2 sandwich trays and drank multiple cups of drink. Pt more awake at this time.

## 2019-03-28 NOTE — Discharge Instructions (Signed)
Be sure to take prenatal vitamins every day to support healthy growth of your pregnancy.

## 2019-03-28 NOTE — ED Notes (Signed)
Psych and TTS at bedside. 

## 2019-03-28 NOTE — BH Assessment (Signed)
Assessment Note  Kathy [name] is an 34 y.o. female who presents to the ER due to having an overdose from her drug use. Patient states this wasn't an attempt to end her life. It was her using too much methamphetamine. Patient is well known in the ER for similar presentation.  During the interview, the patient was calm, cooperative and pleasant. She was able to provide appropriate answers to the questions. Throughout the interview, the patient denied SI/HI and AV/H.  Diagnosis: Substance Abuse Disorder  Past Medical History:  Past Medical History:  Diagnosis Date  . Anxiety   . Asthma   . Bipolar disorder ([place])   . Compression fracture of spine ([place])   . Fibromyalgia   . Headache   . PTSD (post-traumatic stress disorder)   . Seizures ([place])    Related to benzo withdrawal    Past Surgical History:  Procedure Laterality Date  . HAND SURGERY Right   . HAND SURGERY      Family History: No family history on file.  Social History:  reports that she has been smoking cigarettes. She has been smoking about 1.00 pack per day. She has never used smokeless tobacco. She reports current drug use. Drugs: Cocaine, Benzodiazepines, Heroin, and Hydrocodone. She reports that she does not drink alcohol.  Additional Social History:  Alcohol / Drug Use Pain Medications: See PTA Prescriptions: See PTA Over the Counter: See PTA History of alcohol / drug use?: Yes Longest period of sobriety (when/how long): Unable to quantify Substance #1 Name of Substance 1: Ecstasy Substance #2 Name of Substance 2: Methamphetamine  CIWA: CIWA-Ar BP: (!) 110/96 Pulse Rate: 99 COWS:    Allergies: No Known Allergies  Home Medications: (Not in a hospital admission)   OB/GYN Status:  No LMP recorded (lmp unknown). Patient is pregnant.  General Assessment Data Location of Assessment: [facility] ED TTS Assessment: In system Is this a Tele or Face-to-Face Assessment?: Face-to-Face Is this an Initial Assessment or  a Re-assessment for this encounter?: Initial Assessment Language Other than English: No Living Arrangements: Other (Comment) What gender do you identify as?: Female Marital status: Single Pregnancy Status: No Living Arrangements: Parent Can pt return to current living arrangement?: No Admission Status: Involuntary Petitioner: ED Attending Is patient capable of signing voluntary admission?: No Referral Source: Self/Family/Friend Insurance type: None  Medical Screening Exam [facility] Walk-in ONLY) Medical Exam completed: Yes  Crisis Care Plan Living Arrangements: Parent Legal Guardian: Other:(Self) Name of Psychiatrist: Reports of none Name of Therapist: Reports of none  Education Status Is patient currently in school?: No Is the patient employed, unemployed or receiving disability?: Unemployed  Risk to self with the past 6 months Suicidal Ideation: No Has patient been a risk to self within the past 6 months prior to admission? : No Suicidal Intent: No Has patient had any suicidal intent within the past 6 months prior to admission? : No Is patient at risk for suicide?: No Suicidal Plan?: No Has patient had any suicidal plan within the past 6 months prior to admission? : No Access to Means: No What has been your use of drugs/alcohol within the last 12 months?: Ecstasy & Methamphetamine Previous Attempts/Gestures: No How many times?: 0 Other Self Harm Risks: Active drug use Triggers for Past Attempts: None known Intentional Self Injurious Behavior: None Family Suicide History: Unknown Recent stressful life event(s): Other (Comment) Persecutory voices/beliefs?: No Depression: No Depression Symptoms: Guilt Substance abuse history and/or treatment for substance abuse?: Yes Suicide prevention information given to  non-admitted patients: Not applicable  Risk to Others within the past 6 months Homicidal Ideation: No Does patient have any lifetime risk of violence toward others  beyond the six months prior to admission? : No Thoughts of Harm to Others: No Current Homicidal Intent: No Current Homicidal Plan: No Access to Homicidal Means: No Identified Victim: Reports of none History of harm to others?: No Assessment of Violence: None Noted Violent Behavior Description: Reports of none Does patient have access to weapons?: No Criminal Charges Pending?: No Does patient have a court date: No Is patient on probation?: No  Psychosis Hallucinations: None noted Delusions: None noted  Mental Status Report Appearance/Hygiene: Unremarkable, In scrubs Eye Contact: Fair Motor Activity: Freedom of movement, Unremarkable Speech: Logical/coherent, Unremarkable Level of Consciousness: Alert Mood: Anxious, Sad, Pleasant Affect: Anxious, Sad Anxiety Level: Minimal Thought Processes: Coherent, Relevant Judgement: Partial Orientation: Person, Place, Time, Situation, Appropriate for developmental age Obsessive Compulsive Thoughts/Behaviors: Minimal  Cognitive Functioning Concentration: Decreased(Patient impaired due to substance use) Memory: Recent Intact, Remote Intact Is patient IDD: No Insight: Fair Impulse Control: Fair Appetite: Good Have you had any weight changes? : No Change Sleep: No Change Total Hours of Sleep: 7 Vegetative Symptoms: None  ADLScreening [facility]) Patient's cognitive ability adequate to safely complete daily activities?: Yes Patient able to express need for assistance with ADLs?: Yes Independently performs ADLs?: Yes (appropriate for developmental age)  Prior Inpatient Therapy Prior Inpatient Therapy: Yes Prior Therapy Dates: 03/2017, 11/2016, 06/2016 & 03/2016 Prior Therapy Facilty/Provider(s): [facility] Reason for Treatment: Substance Abuse Treatment  Prior Outpatient Therapy Prior Outpatient Therapy: No Does patient have an ACCT team?: No Does patient have [facility] services? : No Does patient have  P4CC services?: No  ADL Screening (condition at time of admission) Patient's cognitive ability adequate to safely complete daily activities?: Yes Is the patient deaf or have difficulty hearing?: No Does the patient have difficulty seeing, even when wearing glasses/contacts?: No Does the patient have difficulty concentrating, remembering, or making decisions?: No Patient able to express need for assistance with ADLs?: Yes Does the patient have difficulty dressing or bathing?: No Independently performs ADLs?: Yes (appropriate for developmental age) Does the patient have difficulty walking or climbing stairs?: No Weakness of Legs: None Weakness of Arms/Hands: None  Home Assistive Devices/Equipment Home Assistive Devices/Equipment: None  Therapy Consults (therapy consults require a physician order) PT Evaluation Needed: No OT Evalulation Needed: No SLP Evaluation Needed: No Abuse/Neglect Assessment (Assessment to be complete while patient is alone) Abuse/Neglect Assessment Can Be Completed: Yes Physical Abuse: Denies Verbal Abuse: Denies Sexual Abuse: Denies Exploitation of patient/patient's resources: Denies Self-Neglect: Denies Values / Beliefs Cultural Requests During Hospitalization: None Spiritual Requests During Hospitalization: None Consults Spiritual Care Consult Needed: No Transition of Care Team Consult Needed: No Advance Directives (For Healthcare) Does Patient Have a Medical Advance Directive?: No  Disposition:  Disposition Initial Assessment Completed for this Encounter: Yes  On Site Evaluation by:   Reviewed with Physician:    Lilyan Gilford MS, LCAS, [facility], NCC Therapeutic Triage Specialist 03/28/2019 9:42 PM

## 2019-03-28 NOTE — ED Notes (Signed)
Pt up and ambulatory to bathroom with steady gait. Pt able to communicate with complete sentences. Pt requesting albuterol inhaler prescription. Hx/o asthma. Mother called for pt pickup due to safe discharge.

## 2019-03-28 NOTE — ED Notes (Signed)
Pt offered toileting at this time, denies that she can provide a urine sample.

## 2019-03-28 NOTE — ED Notes (Signed)
IVC/Consult ordered/Pending 

## 2019-03-28 NOTE — ED Notes (Signed)
Pt given meal tray and 2 apple juice.

## 2019-03-28 NOTE — ED Notes (Signed)
Pt provided supper tray at this time.

## 2019-03-28 NOTE — ED Notes (Signed)
Pt refusing to dress out at this time

## 2019-03-28 NOTE — ED Notes (Signed)
Pt reporting she is pregnant, unknown LMP or how far along.

## 2019-03-28 NOTE — ED Notes (Addendum)
Pt given sandwich tray and ginger aile. Continuing to speak in slurred speech. Dr Scotty Court aware

## 2019-03-28 NOTE — ED Triage Notes (Signed)
Pt to ED via [facility] from [facility] suites for chief complaint of drug usage, unresponsive.  EMS reports they found pt under the bed, cyanotic and unresponsive. Pt admits to heroin and meth use, undetermined last usage.  EMS did not give narcan.  Pt arrived with fentanyl patch that was not hers.  Pt has slurred speech  Pt right eye is red and swollen.

## 2019-03-28 NOTE — ED Notes (Signed)
Pt provided warm blanket.

## 2019-03-28 NOTE — ED Notes (Signed)
Purse taken away from pt due to there being drugs in it. Pt purse sat beside nurses' station with pt label on it.

## 2019-03-28 NOTE — ED Provider Notes (Signed)
[facility] Emergency Department Provider Note  ____________________________________________  Time seen: Approximately 1:39 PM  I have reviewed the triage vital signs and the nursing notes.   HISTORY  Chief Complaint drug usage    Level 5 Caveat: Portions of the History and Physical including HPI and review of systems are unable to be completely obtained due to patient being a poor historian   HPI Kathy [name] is a 34 y.o. female with a history of bipolar disorder, ASD, anxiety who is brought to the ED by EMS after being found unresponsive and cyanotic under a bed in a hotel.  Patient states she was just staying there is a residence.  States she used IV methamphetamine this morning, intranasal heroin yesterday.  Denies SI but is tearful.  Denies vision change or double vision but does report right eye pain and swelling for the past few days.  Patient is not forthcoming with questioning.    Past Medical History:  Diagnosis Date  . Anxiety   . Asthma   . Bipolar disorder ([place])   . Compression fracture of spine ([place])   . Fibromyalgia   . Headache   . PTSD (post-traumatic stress disorder)   . Seizures ([place])    Related to benzo withdrawal     Patient Active Problem List   Diagnosis Date Noted  . Opiate overdose ([place])   . Polysubstance (excluding opioids) dependence, daily use ([place]) 11/30/2016  . Polysubstance dependence including opioid type drug, continuous use ([place]) 11/30/2016  . History of 3 spontaneous abortions 03/24/2016  . Eczema 03/24/2016  . Depression 03/24/2016  . Asthma without status asthmaticus without complication 33/82/5053  . Anxiety 03/24/2016  . Opiate abuse, continuous ([place]) 03/24/2016  . Fibromyalgia 04/17/2015  . Chronic hepatitis C virus infection ([place]) 02/28/2015  . Chronic migraine 07/23/2014  . Bipolar disorder ([place]) 05/10/2013  . PTSD (post-traumatic stress disorder) 05/10/2013  . Polysubstance abuse ([place]) 10/03/2012      Past Surgical History:  Procedure Laterality Date  . HAND SURGERY Right   . HAND SURGERY       Prior to Admission medications   Medication Sig Start Date End Date Taking? Authorizing Provider  ALPRAZolam Duanne Moron) 1 MG tablet Take 2 mg by mouth 4 (four) times daily. 09/26/18   [provider]  benztropine (COGENTIN) 0.5 MG tablet Take 1 tablet (0.5 mg total) by mouth 2 (two) times daily. For prevention 04/04/17   Lindell Spar I, NP  diazepam (VALIUM) 10 MG tablet Take 20 mg by mouth 2 (two) times daily. 09/26/18   [provider]  divalproex (DEPAKOTE) 250 MG DR tablet Take 250 mg by mouth 3 (three) times daily. 08/16/18   [provider]  gabapentin (NEURONTIN) 300 MG capsule Take 2 capsules (600 mg total) by mouth 3 (three) times daily at 8am, 3pm and bedtime. For agitation/substance withdrawal syndrome 04/04/17   Lindell Spar I, NP  hydrOXYzine (ATARAX/VISTARIL) 50 MG tablet Take 1 tablet (50 mg) by mouth four times daily as needed: For anxiety 04/04/17   Lindell Spar I, NP  naloxone [facility]) nasal spray 4 mg/0.1 mL Use for overdose of heroin/opioids. 12/25/18   Vanessa Luzerne, MD  nicotine polacrilex (NICORETTE) 2 MG gum Take 1 each (2 mg total) by mouth as needed for smoking cessation. (May purchase from over the counter): For smoking cessation 04/04/17   Lindell Spar I, NP  prazosin (MINIPRESS) 1 MG capsule Take 3 capsules (3 mg total) by mouth at bedtime. 04/04/17  Thermon Leyland, NP  propranolol (INDERAL) 20 MG tablet Take 1 tablet (20 mg total) by mouth 2 (two) times daily. 04/05/17   Thermon Leyland, NP  QUEtiapine (SEROQUEL) 300 MG tablet Take 1 tablet (300 mg total) by mouth at bedtime. For mood control 04/04/17   Armandina Stammer I, NP  QUEtiapine (SEROQUEL) 50 MG tablet Take 1 tablet (50 mg total) by mouth 3 (three) times daily. For agitation/mood control 04/04/17   Sanjuana Kava, NP     Allergies Patient has no known allergies.   No family history on  file.  Social History Social History   Tobacco Use  . Smoking status: Current Every Day Smoker    Packs/day: 1.00    Types: Cigarettes  . Smokeless tobacco: Never Used  . Tobacco comment: Pt does not want to stop smoking at this time  Substance Use Topics  . Alcohol use: Never    Comment: Daily. Last drink: PTA  from [facility], Drinks beer and liquor.   . Drug use: Yes    Types: Cocaine, Benzodiazepines, Heroin, Hydrocodone    Comment: Cocaine: last used; Heroin: last used 09/17/2016;     Review of Systems Level 5 Caveat: Portions of the History and Physical including HPI and review of systems are unable to be completely obtained due to patient being a poor historian   Constitutional:   No known fever.  ENT:   No rhinorrhea. Cardiovascular:   No chest pain or syncope. Respiratory:   No dyspnea or cough. Gastrointestinal:   Negative for abdominal pain, vomiting and diarrhea.  Musculoskeletal:   Negative for focal pain or swelling ____________________________________________   PHYSICAL EXAM:  VITAL SIGNS: ED Triage Vitals  Enc Vitals Group     BP 03/28/19 1300 112/82     Pulse Rate 03/28/19 1300 78     Resp 03/28/19 1300 16     Temp 03/28/19 1300 97.6 F (36.4 C)     Temp Source 03/28/19 1300 Oral     SpO2 03/28/19 1300 100 %     Weight 03/28/19 1241 165 lb (74.8 kg)     Height 03/28/19 1241 5\' 3"  (1.6 m)     Head Circumference --      Peak Flow --      Pain Score 03/28/19 1241 0     Pain Loc --      Pain Edu? --      Excl. in GC? --     Vital signs reviewed, nursing assessments reviewed.   Constitutional:   Alert and oriented. Non-toxic appearance. Eyes:   Conjunctivae are normal. EOMI. PERRL.  Right eye with lower lid swelling and thick exudate.  Tender to the touch.  Globe nontender ENT      Head:   Normocephalic and atraumatic.      Nose:   No congestion/rhinnorhea.       Mouth/Throat:   MMM, no pharyngeal erythema. No peritonsillar mass.       Neck:   No  meningismus. Full ROM. Hematological/Lymphatic/Immunilogical:   No cervical lymphadenopathy. Cardiovascular:   RRR. Symmetric bilateral radial and DP pulses.  No murmurs. Cap refill less than 2 seconds. Respiratory:   Normal respiratory effort without tachypnea/retractions.  Coarse inspiratory breath sounds diffusely.  Diffuse end expiratory wheezing.  Symmetric air movement. Gastrointestinal:   Soft and nontender. Non distended. There is no CVA tenderness.  No rebound, rigidity, or guarding. Musculoskeletal:   Normal range of motion in all extremities. No joint effusions.  No lower extremity tenderness.  No edema. Neurologic:   Slurred speech Motor grossly intact. Skin:    Skin is warm, dry and intact.  Scattered crusted papular lesions on bilateral arms and face.  Track marks..  No petechiae, purpura, or bullae.  ____________________________________________    LABS (pertinent positives/negatives) (all labs ordered are listed, but only abnormal results are displayed) Labs Reviewed  COMPREHENSIVE METABOLIC PANEL - Abnormal; Notable for the following components:      Result Value   Total Protein 8.7 (*)    Albumin 3.4 (*)    AST 127 (*)    ALT 115 (*)    All other components within normal limits  CBC WITH DIFFERENTIAL/PLATELET - Abnormal; Notable for the following components:   Hemoglobin 11.9 (*)    All other components within normal limits  ACETAMINOPHEN LEVEL - Abnormal; Notable for the following components:   Acetaminophen (Tylenol), Serum <10 (*)    All other components within normal limits  SALICYLATE LEVEL - Abnormal; Notable for the following components:   Salicylate Lvl <7.0 (*)    All other components within normal limits  URINALYSIS, COMPLETE (UACMP) WITH MICROSCOPIC - Abnormal; Notable for the following components:   Color, Urine YELLOW (*)    APPearance HAZY (*)    Bacteria, UA RARE (*)    All other components within normal limits  HCG, QUANTITATIVE, PREGNANCY -  Abnormal; Notable for the following components:   hCG, Beta Chain, Quant, S 95,190 (*)    All other components within normal limits  RESPIRATORY PANEL BY RT PCR (FLU A&B, COVID)  ETHANOL  LIPASE, BLOOD  URINE DRUG SCREEN, QUALITATIVE ([facility] ONLY)   ____________________________________________   EKG    ____________________________________________    RADIOLOGY  DG Chest Portable 1 View  Result Date: 03/28/2019 CLINICAL DATA:  Cough, wheezing. EXAM: PORTABLE CHEST 1 VIEW COMPARISON:  None. FINDINGS: The heart size and mediastinal contours are within normal limits. Both lungs are clear. The visualized skeletal structures are unremarkable. IMPRESSION: No active disease. Electronically Signed   By: Lupita Raider M.D.   On: 03/28/2019 13:37    ____________________________________________   PROCEDURES Procedures  ____________________________________________    CLINICAL IMPRESSION / ASSESSMENT AND PLAN / ED COURSE  Medications ordered in the ED: Medications  folic acid injection 1 mg (has no administration in time range)  prenatal vitamin w/FE, FA (PRENATAL 1 + 1) 27-1 MG tablet 1 tablet (has no administration in time range)  sodium chloride 0.9 % bolus 500 mL (0 mLs Intravenous Stopped 03/28/19 1346)    Pertinent labs & imaging results that were available during my care of the patient were reviewed by me and considered in my medical decision making (see chart for details).   Kathy [name] was evaluated in Emergency Department on 03/28/2019 for the symptoms described in the history of present illness. She was evaluated in the context of the global COVID-19 pandemic, which necessitated consideration that the patient might be at risk for infection with the SARS-CoV-2 virus that causes COVID-19. Institutional protocols and algorithms that pertain to the evaluation of patients at risk for COVID-19 are in a state of rapid change based on information released by regulatory bodies  including the CDC and federal and state organizations. These policies and algorithms were followed during the patient's care in the ED.   Patient presents with slurred speech, signs of depression in the setting of polysubstance abuse.  Unclear if her being found cyanotic and unresponsive under a bed is due  to drug abuse misadventure versus suicide attempt.  Will IVC for safety pending psychiatry evaluation.  Will check a CT head to rule out orbital cellulitis.  Check tox panel, pregnancy test, chest x-ray due to abnormal lung exam.  Check a Covid test.  Clinical Course as of Mar 28 1507  Wed Mar 28, 2019  1433 hCG 95,000  hCG, quantitative, pregnancy(!) [PS]    Clinical Course User Index [PS] Sharman Cheek, MD    ----------------------------------------- 3:08 PM on 03/28/2019 -----------------------------------------  Work-up unremarkable.  Awaiting CT orbits.  Will start topical and oral antibiotic for preseptal cellulitis.  Covid negative.   ____________________________________________   FINAL CLINICAL IMPRESSION(S) / ED DIAGNOSES    Final diagnoses:  Polysubstance abuse (HCC)  First trimester pregnancy     ED Discharge Orders    None      Portions of this note were generated with dragon dictation software. Dictation errors may occur despite best attempts at proofreading.   Sharman Cheek, MD 03/28/19 [phone]

## 2019-03-28 NOTE — ED Notes (Addendum)
Pts discharge information explained with pt and verbal understanding on education provided back to this RN. 1 personal belongings bag given to pt (1 backpack and 1 lotion.) Pt walked to mothers car out front of ED WR.

## 2019-03-28 NOTE — Consult Note (Signed)
[facility]   Reason for Consult: Drug overdose Referring Physician: Dr. Derrill KayGoodman Patient Identification: Kathy [name] MRN:  [id number] Principal Diagnosis: <principal problem not specified> Diagnosis:  Active Problems:   * No active hospital problems. *   Total Time spent with patient: 30 minutes  Subjective:   Kathy [name] is a 34 y.o. female patient admitted following being found down in a hotel.  HPI: Patient is a 34 year old female who presents after being found down in a hotel room.  Patient reports regret for using multiple substances.  She endorses use of intranasal heroin as well as intravenous methamphetamine.  Patient reports that she may have also taken other drugs given to her during her period of intoxication.  She is however denying any psychiatric complaints.  She denies that the drug overdose was in any way of a suicidal attempt.  She denies any suicidal or homicidal ideations.  She also has denied any psychotic symptoms.  She is agreeable to outpatient substance abuse resources which will be given to her.  Patient is also concerned given her recent pregnancy as she has not obtained any obstetric care for the baby at this time.  She denies that this is distressing to her but reports that she would like to get this figured out.  Is more upset by her relapse that she reports today she had 2 years of sobriety which were the best years of her life, and was hoping to be able to achieve that sobriety once more.  Past Psychiatric History: Patient has numerous ER visits for intoxication with multiple different substances.  Risk to Self:  No Risk to Others:  No Prior Inpatient Therapy:  Yes Prior Outpatient Therapy:  Yes   Past Medical History:  Past Medical History:  Diagnosis Date  . Anxiety   . Asthma   . Bipolar disorder (HCC)   . Compression fracture of spine (HCC)   . Fibromyalgia   . Headache   . PTSD (post-traumatic stress disorder)   .  Seizures (HCC)    Related to benzo withdrawal    Past Surgical History:  Procedure Laterality Date  . HAND SURGERY Right   . HAND SURGERY     Family History: No family history on file. Family Psychiatric  History: Denies Social History:  Social History   Substance and Sexual Activity  Alcohol Use Never   Comment: Daily. [facility], Drinks beer and liquor.      Social History   Substance and Sexual Activity  Drug Use Yes  . Types: Cocaine, Benzodiazepines, Heroin, Hydrocodone   Comment: Cocaine: last used; Heroin: last used 09/17/2016;     Social History   Socioeconomic History  . Marital status: Single    Spouse name: Not on file  . Number of children: Not on file  . Years of education: Not on file  . Highest education level: Not on file  Occupational History  . Not on file  Tobacco Use  . Smoking status: Current Every Day Smoker    Packs/day: 1.00    Types: Cigarettes  . Smokeless tobacco: Never Used  . Tobacco comment: Pt does not want to stop smoking at this time  Substance and Sexual Activity  . Alcohol use: Never    Comment: Daily. [facility], Drinks beer and liquor.   . Drug use: Yes    Types: Cocaine, Benzodiazepines, Heroin, Hydrocodone    Comment: Cocaine: last used; Heroin: last used  09/17/2016;   . Sexual activity: Yes    Birth control/protection: Injection    Comment: Depo Provera  Other Topics Concern  . Not on file  Social History Narrative   ** Merged History Encounter **       Social Determinants of Health   Financial Resource Strain:   . Difficulty of Paying Living Expenses: Not on file  Food Insecurity:   . Worried About Programme researcher, broadcasting/film/video in the Last Year: Not on file  . Ran Out of Food in the Last Year: Not on file  Transportation Needs:   . Lack of Transportation (Medical): Not on file  . Lack of Transportation (Non-Medical): Not on file  Physical Activity:   . Days of Exercise per Week: Not on file   . Minutes of Exercise per Session: Not on file  Stress:   . Feeling of Stress : Not on file  Social Connections:   . Frequency of Communication with Friends and Family: Not on file  . Frequency of Social Gatherings with Friends and Family: Not on file  . Attends Religious Services: Not on file  . Active Member of Clubs or Organizations: Not on file  . Attends Banker Meetings: Not on file  . Marital Status: Not on file   Additional Social History:    Allergies:  No Known Allergies  Labs:  Results for orders placed or performed during the hospital encounter of 03/28/19 (from the past 48 hour(s))  Comprehensive metabolic panel     Status: Abnormal   Collection Time: 03/28/19  1:02 PM  Result Value Ref Range   Sodium 136 135 - 145 mmol/L   Potassium 3.6 3.5 - 5.1 mmol/L   Chloride 100 98 - 111 mmol/L   CO2 26 22 - 32 mmol/L   Glucose, Bld 92 70 - 99 mg/dL   BUN 8 6 - 20 mg/dL   Creatinine, Ser 3.70 0.44 - 1.00 mg/dL   Calcium 9.4 8.9 - 96.4 mg/dL   Total Protein 8.7 (H) 6.5 - 8.1 g/dL   Albumin 3.4 (L) 3.5 - 5.0 g/dL   AST 383 (H) 15 - 41 U/L   ALT 115 (H) 0 - 44 U/L   Alkaline Phosphatase 83 38 - 126 U/L   Total Bilirubin 0.4 0.3 - 1.2 mg/dL   GFR calc non Af Amer >60 >60 mL/min   GFR calc Af Amer >60 >60 mL/min   Anion gap 10 5 - 15    Comment: Performed at [facility], [address] [place], [place] [address]  CBC with Differential     Status: Abnormal   Collection Time: 03/28/19  1:02 PM  Result Value Ref Range   WBC 7.1 4.0 - 10.5 K/uL   RBC 4.48 3.87 - 5.11 MIL/uL   Hemoglobin 11.9 (L) 12.0 - 15.0 g/dL   HCT 37.5 43.6 - 06.7 %   MCV 81.7 80.0 - 100.0 fL   MCH 26.6 26.0 - 34.0 pg   MCHC 32.5 30.0 - 36.0 g/dL   RDW 70.3 40.3 - 52.4 %   Platelets 308 150 - 400 K/uL   nRBC 0.0 0.0 - 0.2 %   Neutrophils Relative % 63 %   Neutro Abs 4.4 1.7 - 7.7 K/uL   Lymphocytes Relative 23 %   Lymphs Abs 1.6 0.7 - 4.0 K/uL   Monocytes Relative 7 %    Monocytes Absolute 0.5 0.1 - 1.0 K/uL   Eosinophils Relative 7 %  Eosinophils Absolute 0.5 0.0 - 0.5 K/uL   Basophils Relative 0 %   Basophils Absolute 0.0 0.0 - 0.1 K/uL   Immature Granulocytes 0 %   Abs Immature Granulocytes 0.03 0.00 - 0.07 K/uL    Comment: Performed at [facility], [address] [place], [place] [address]  Ethanol     Status: None   Collection Time: 03/28/19  1:02 PM  Result Value Ref Range   Alcohol, Ethyl (B) <10 <10 mg/dL    Comment: (NOTE) Lowest detectable limit for serum alcohol is 10 mg/dL. For medical purposes only. Performed at [facility], [address] [place], [place] [address]   Acetaminophen level     Status: Abnormal   Collection Time: 03/28/19  1:02 PM  Result Value Ref Range   Acetaminophen (Tylenol), Serum <10 (L) 10 - 30 ug/mL    Comment: (NOTE) Therapeutic concentrations vary significantly. A range of 10-30 ug/mL  may be an effective concentration for many patients. However, some  are best treated at concentrations outside of this range. Acetaminophen concentrations >150 ug/mL at 4 hours after ingestion  and >50 ug/mL at 12 hours after ingestion are often associated with  toxic reactions. Performed at [facility], [address] [place], [place] [address]   Salicylate level     Status: Abnormal   Collection Time: 03/28/19  1:02 PM  Result Value Ref Range   Salicylate Lvl <0.0 (L) 7.0 - 30.0 mg/dL    Comment: Performed at [facility], [address] [place], [place] [address]  Lipase, blood     Status: None   Collection Time: 03/28/19  1:02 PM  Result Value Ref Range   Lipase 20 11 - 51 U/L    Comment: Performed at [facility], [address] [place], [place] [address]  hCG, quantitative, pregnancy     Status: Abnormal   Collection Time: 03/28/19  1:02 PM  Result Value Ref Range   hCG, Beta Chain, Quant, S 95,190 (H) <5 mIU/mL    Comment:          GEST. AGE       CONC.  (mIU/mL)   <=1 WEEK        5 - 50     2 WEEKS       50 - 500     3 WEEKS       100 - 10,000     4 WEEKS     1,000 - 30,000     5 WEEKS     3,500 - 115,000   6-8 WEEKS     12,000 - 270,000    12 WEEKS     15,000 - 220,000        FEMALE AND NON-PREGNANT FEMALE:     LESS THAN 5 mIU/mL Performed at [facility], [address] [place],  [address]   Respiratory Panel by RT PCR (Flu A&B, Covid) - Nasopharyngeal Swab     Status: None   Collection Time: 03/28/19  1:19 PM   Specimen: Nasopharyngeal Swab  Result Value Ref Range   SARS Coronavirus 2 by RT PCR NEGATIVE NEGATIVE    Comment: (NOTE) SARS-CoV-2 target nucleic acids are NOT DETECTED. The SARS-CoV-2 RNA is generally detectable in upper respiratoy specimens during the acute phase of infection. The lowest concentration of SARS-CoV-2 viral copies this assay can detect is 131 copies/mL. A negative result does not preclude SARS-Cov-2 infection and should not be used as the  sole basis for treatment or other patient management decisions. A negative result may occur with  improper specimen collection/handling, submission of specimen other than nasopharyngeal swab, presence of viral mutation(s) within the areas targeted by this assay, and inadequate number of viral copies (<131 copies/mL). A negative result must be combined with clinical observations, patient history, and epidemiological information. The expected result is Negative. Fact Sheet for Patients:  https://www.moore.com/ Fact Sheet for Healthcare Providers:  https://www.young.biz/ This test is not yet ap proved or cleared by the [place] FDA and  has been authorized for detection and/or diagnosis of SARS-CoV-2 by FDA under an Emergency Use Authorization (EUA). This EUA will remain  in effect (meaning this test can be used) for the duration of the COVID-19 declaration under Section 564(b)(1) of the Act, 21  U.S.C. section 360bbb-3(b)(1), unless the authorization is terminated or revoked sooner.    Influenza A by PCR NEGATIVE NEGATIVE   Influenza B by PCR NEGATIVE NEGATIVE    Comment: (NOTE) The Xpert Xpress SARS-CoV-2/FLU/RSV assay is intended as an aid in  the diagnosis of influenza from Nasopharyngeal swab specimens and  should not be used as a sole basis for treatment. Nasal washings and  aspirates are unacceptable for Xpert Xpress SARS-CoV-2/FLU/RSV  testing. Fact Sheet for Patients: https://www.moore.com/ Fact Sheet for Healthcare Providers: https://www.young.biz/ This test is not yet approved or cleared by the [place] FDA and  has been authorized for detection and/or diagnosis of SARS-CoV-2 by  FDA under an Emergency Use Authorization (EUA). This EUA will remain  in effect (meaning this test can be used) for the duration of the  Covid-19 declaration under Section 564(b)(1) of the Act, 21  U.S.C. section 360bbb-3(b)(1), unless the authorization is  terminated or revoked. Performed at [facility], [address] [place], [place] [address]   Urinalysis, Complete w Microscopic     Status: Abnormal   Collection Time: 03/28/19  2:39 PM  Result Value Ref Range   Color, Urine YELLOW (A) YELLOW   APPearance HAZY (A) CLEAR   Specific Gravity, Urine 1.012 1.005 - 1.030   pH 7.0 5.0 - 8.0   Glucose, UA NEGATIVE NEGATIVE mg/dL   Hgb urine dipstick NEGATIVE NEGATIVE   Bilirubin Urine NEGATIVE NEGATIVE   Ketones, ur NEGATIVE NEGATIVE mg/dL   Protein, ur NEGATIVE NEGATIVE mg/dL   Nitrite NEGATIVE NEGATIVE   Leukocytes,Ua NEGATIVE NEGATIVE   RBC / HPF 0-5 0 - 5 RBC/hpf   WBC, UA 0-5 0 - 5 WBC/hpf   Bacteria, UA RARE (A) NONE SEEN   Squamous Epithelial / LPF 0-5 0 - 5   Mucus PRESENT     Comment: Performed at [facility], [address], [place], [place] [address]  Urine Drug Screen, Qualitative     Status: Abnormal    Collection Time: 03/28/19  2:39 PM  Result Value Ref Range   Tricyclic, Ur Screen NONE DETECTED NONE DETECTED   Amphetamines, Ur Screen POSITIVE (A) NONE DETECTED   MDMA (Ecstasy)Ur Screen NONE DETECTED NONE DETECTED   Cocaine Metabolite,Ur Benjamin POSITIVE (A) NONE DETECTED   Opiate, Ur Screen POSITIVE (A) NONE DETECTED   Phencyclidine (PCP) Ur S NONE DETECTED NONE DETECTED   Cannabinoid 50 Ng, Ur [place] NONE DETECTED NONE DETECTED   Barbiturates, Ur Screen NONE DETECTED NONE DETECTED   Benzodiazepine, Ur Scrn POSITIVE (A) NONE DETECTED   Methadone Scn, Ur NONE DETECTED NONE DETECTED    Comment: (NOTE) Tricyclics + metabolites, urine    Cutoff 1000  ng/mL Amphetamines + metabolites, urine  Cutoff 1000 ng/mL MDMA (Ecstasy), urine              Cutoff 500 ng/mL Cocaine Metabolite, urine          Cutoff 300 ng/mL Opiate + metabolites, urine        Cutoff 300 ng/mL Phencyclidine (PCP), urine         Cutoff 25 ng/mL Cannabinoid, urine                 Cutoff 50 ng/mL Barbiturates + metabolites, urine  Cutoff 200 ng/mL Benzodiazepine, urine              Cutoff 200 ng/mL Methadone, urine                   Cutoff 300 ng/mL The urine drug screen provides only a preliminary, unconfirmed analytical test result and should not be used for non-medical purposes. Clinical consideration and professional judgment should be applied to any positive drug screen result due to possible interfering substances. A more specific alternate chemical method must be used in order to obtain a confirmed analytical result. Gas chromatography / mass spectrometry (GC/MS) is the preferred confirmat ory method. Performed at [facility], [address] [place], [place] [address]     Current Facility-Administered Medications  Medication Dose Route Frequency Provider Last Rate Last Admin  . cephALEXin (KEFLEX) capsule 500 mg  500 mg Oral Q6H Phineas Semen, MD   500 mg at 03/28/19 1746  . folic acid injection 1 mg   1 mg Intravenous Daily Sharman Cheek, MD   1 mg at 03/28/19 1638  . prenatal vitamin w/FE, FA (PRENATAL 1 + 1) 27-1 MG tablet 1 tablet  1 tablet Oral Q1200 Sharman Cheek, MD   1 tablet at 03/28/19 1636   Current Outpatient Medications  Medication Sig Dispense Refill  . ALPRAZolam (XANAX) 1 MG tablet Take 2 mg by mouth 4 (four) times daily.    . benztropine (COGENTIN) 0.5 MG tablet Take 1 tablet (0.5 mg total) by mouth 2 (two) times daily. For prevention 60 tablet 0  . diazepam (VALIUM) 10 MG tablet Take 20 mg by mouth 2 (two) times daily.    . divalproex (DEPAKOTE) 250 MG DR tablet Take 250 mg by mouth 3 (three) times daily.    Marland Kitchen gabapentin (NEURONTIN) 300 MG capsule Take 2 capsules (600 mg total) by mouth 3 (three) times daily at 8am, 3pm and bedtime. For agitation/substance withdrawal syndrome 90 capsule 0  . hydrOXYzine (ATARAX/VISTARIL) 50 MG tablet Take 1 tablet (50 mg) by mouth four times daily as needed: For anxiety 90 tablet 0  . naloxone (NARCAN) nasal spray 4 mg/0.1 mL Use for overdose of heroin/opioids. 1 each 1  . nicotine polacrilex (NICORETTE) 2 MG gum Take 1 each (2 mg total) by mouth as needed for smoking cessation. (May purchase from over the counter): For smoking cessation 100 tablet 0  . prazosin (MINIPRESS) 1 MG capsule Take 3 capsules (3 mg total) by mouth at bedtime. 90 capsule 0  . propranolol (INDERAL) 20 MG tablet Take 1 tablet (20 mg total) by mouth 2 (two) times daily. 60 tablet 0  . QUEtiapine (SEROQUEL) 300 MG tablet Take 1 tablet (300 mg total) by mouth at bedtime. For mood control 30 tablet 0  . QUEtiapine (SEROQUEL) 50 MG tablet Take 1 tablet (50 mg total) by mouth 3 (three) times daily. For agitation/mood control 90 tablet 0  Musculoskeletal: Strength & Muscle Tone: within normal limits Gait & Station: normal Patient leans: N/A  Psychiatric Specialty Exam: Physical Exam  Review of Systems  Psychiatric/Behavioral: Negative for dysphoric mood,  self-injury and suicidal ideas. The patient is not nervous/anxious.     Blood pressure (!) 110/96, pulse 99, temperature (!) 97.5 F (36.4 C), temperature source Oral, resp. rate 20, height 5\' 3"  (1.6 m), weight 74.8 kg, SpO2 100 %.Body mass index is 29.23 kg/m.  General Appearance: Disheveled  Eye Contact:  Fair  Speech:  Garbled  Volume:  Normal  Mood:  Anxious  Affect:  Congruent  Thought Process:  Coherent  Orientation:  Full (Time, Place, and Person)  Thought Content:  Logical  Suicidal Thoughts:  No  Homicidal Thoughts:  No  Memory:  Recent;   Fair  Judgement:  Intact  Insight:  Present  Psychomotor Activity:  Normal  Concentration:  Concentration: Fair  Recall:  of Knowledge:  Fair  Language:  Fair  Akathisia:  No  Handed:  Right  AIMS (if indicated):     Assets:  Communication Skills Desire for Improvement Resilience  ADL's:  Intact  Cognition:  WNL  Sleep:        Treatment Plan Summary: 34 year old woman with history of polysubstance abuse presenting intoxicated after being found down.  Patient is denying any psychiatric symptoms at this time.  She will benefit however from outpatient psychiatric as well as substance abuse resources.  Patient in agreement with the plan to discharge.  IVC rescinded  Diagnosis: Polysubstance abuse  Disposition: No evidence of imminent risk to self or others at present.   Patient does not meet criteria for psychiatric inpatient admission. Supportive therapy provided about ongoing stressors. Discussed crisis plan, support from social network, calling 911, coming to the Emergency Department, and calling Suicide Hotline.  20, MD 03/28/2019 7:32 PM

## 2019-04-04 ENCOUNTER — Encounter: Payer: Self-pay | Admitting: Emergency Medicine

## 2019-04-04 ENCOUNTER — Other Ambulatory Visit: Payer: Self-pay

## 2019-04-04 ENCOUNTER — Emergency Department
Admission: EM | Admit: 2019-04-04 | Discharge: 2019-04-04 | Disposition: A | Discharge status: Home / Self Care | Payer: Self-pay | Attending: Emergency Medicine | Admitting: Emergency Medicine

## 2019-04-04 ENCOUNTER — Emergency Department: Payer: Self-pay

## 2019-04-04 DIAGNOSIS — Z20822 Contact with and (suspected) exposure to covid-19: Secondary | ICD-10-CM | POA: Insufficient documentation

## 2019-04-04 DIAGNOSIS — T40601A Poisoning by unspecified narcotics, accidental (unintentional), initial encounter: Secondary | ICD-10-CM | POA: Diagnosis present

## 2019-04-04 DIAGNOSIS — Z3A15 15 weeks gestation of pregnancy: Secondary | ICD-10-CM | POA: Insufficient documentation

## 2019-04-04 DIAGNOSIS — Z8742 Personal history of other diseases of the female genital tract: Secondary | ICD-10-CM

## 2019-04-04 DIAGNOSIS — M797 Fibromyalgia: Secondary | ICD-10-CM | POA: Diagnosis present

## 2019-04-04 DIAGNOSIS — Z8759 Personal history of other complications of pregnancy, childbirth and the puerperium: Secondary | ICD-10-CM

## 2019-04-04 DIAGNOSIS — IMO0002 Reserved for concepts with insufficient information to code with codable children: Secondary | ICD-10-CM | POA: Diagnosis present

## 2019-04-04 DIAGNOSIS — N3001 Acute cystitis with hematuria: Secondary | ICD-10-CM | POA: Insufficient documentation

## 2019-04-04 DIAGNOSIS — F29 Unspecified psychosis not due to a substance or known physiological condition: Secondary | ICD-10-CM | POA: Insufficient documentation

## 2019-04-04 DIAGNOSIS — B182 Chronic viral hepatitis C: Secondary | ICD-10-CM | POA: Diagnosis present

## 2019-04-04 DIAGNOSIS — F1920 Other psychoactive substance dependence, uncomplicated: Secondary | ICD-10-CM | POA: Diagnosis present

## 2019-04-04 DIAGNOSIS — F329 Major depressive disorder, single episode, unspecified: Secondary | ICD-10-CM | POA: Diagnosis present

## 2019-04-04 DIAGNOSIS — G43709 Chronic migraine without aura, not intractable, without status migrainosus: Secondary | ICD-10-CM | POA: Diagnosis present

## 2019-04-04 DIAGNOSIS — O99340 Other mental disorders complicating pregnancy, unspecified trimester: Secondary | ICD-10-CM | POA: Insufficient documentation

## 2019-04-04 DIAGNOSIS — F1910 Other psychoactive substance abuse, uncomplicated: Secondary | ICD-10-CM | POA: Insufficient documentation

## 2019-04-04 DIAGNOSIS — J45909 Unspecified asthma, uncomplicated: Secondary | ICD-10-CM | POA: Diagnosis present

## 2019-04-04 DIAGNOSIS — F4310 Post-traumatic stress disorder, unspecified: Secondary | ICD-10-CM | POA: Diagnosis present

## 2019-04-04 DIAGNOSIS — O99331 Smoking (tobacco) complicating pregnancy, first trimester: Secondary | ICD-10-CM | POA: Insufficient documentation

## 2019-04-04 DIAGNOSIS — F419 Anxiety disorder, unspecified: Secondary | ICD-10-CM | POA: Diagnosis present

## 2019-04-04 DIAGNOSIS — L309 Dermatitis, unspecified: Secondary | ICD-10-CM | POA: Diagnosis present

## 2019-04-04 DIAGNOSIS — F319 Bipolar disorder, unspecified: Secondary | ICD-10-CM | POA: Diagnosis present

## 2019-04-04 DIAGNOSIS — F1110 Opioid abuse, uncomplicated: Secondary | ICD-10-CM | POA: Diagnosis present

## 2019-04-04 DIAGNOSIS — F17210 Nicotine dependence, cigarettes, uncomplicated: Secondary | ICD-10-CM | POA: Insufficient documentation

## 2019-04-04 DIAGNOSIS — F1120 Opioid dependence, uncomplicated: Secondary | ICD-10-CM | POA: Diagnosis present

## 2019-04-04 DIAGNOSIS — F32A Depression, unspecified: Secondary | ICD-10-CM | POA: Diagnosis present

## 2019-04-04 DIAGNOSIS — F411 Generalized anxiety disorder: Secondary | ICD-10-CM | POA: Diagnosis present

## 2019-04-04 LAB — URINALYSIS, COMPLETE (UACMP) WITH MICROSCOPIC
Bilirubin Urine: NEGATIVE
Glucose, UA: NEGATIVE mg/dL
Ketones, ur: 5 mg/dL — AB
Nitrite: POSITIVE — AB
Protein, ur: 100 mg/dL — AB
Specific Gravity, Urine: 1.027 (ref 1.005–1.030)
pH: 5 (ref 5.0–8.0)

## 2019-04-04 LAB — HCG, QUANTITATIVE, PREGNANCY: hCG, Beta Chain, Quant, S: 71699 m[IU]/mL — ABNORMAL HIGH (ref <5)

## 2019-04-04 LAB — COMPREHENSIVE METABOLIC PANEL
ALT: 109 U/L — ABNORMAL HIGH (ref 0–44)
AST: 100 U/L — ABNORMAL HIGH (ref 15–41)
Albumin: 4.1 g/dL (ref 3.5–5.0)
Alkaline Phosphatase: 86 U/L (ref 38–126)
Anion gap: 13 (ref 5–15)
BUN: 18 mg/dL (ref 6–20)
CO2: 23 mmol/L (ref 22–32)
Calcium: 9.4 mg/dL (ref 8.9–10.3)
Chloride: 100 mmol/L (ref 98–111)
Creatinine, Ser: 0.69 mg/dL (ref 0.44–1.00)
GFR calc Af Amer: >60 mL/min (ref >60)
GFR calc non Af Amer: >60 mL/min (ref >60)
Glucose, Bld: 79 mg/dL (ref 70–99)
Potassium: 3.6 mmol/L (ref 3.5–5.1)
Sodium: 136 mmol/L (ref 135–145)
Total Bilirubin: 0.7 mg/dL (ref 0.3–1.2)
Total Protein: 9.7 g/dL — ABNORMAL HIGH (ref 6.5–8.1)

## 2019-04-04 LAB — POCT PREGNANCY, URINE: Preg Test, Ur: POSITIVE — AB

## 2019-04-04 LAB — CBC WITH DIFFERENTIAL/PLATELET
Abs Immature Granulocytes: 0.03 10*3/uL (ref 0.00–0.07)
Basophils Absolute: 0 10*3/uL (ref 0.0–0.1)
Basophils Relative: 0 %
Eosinophils Absolute: 0.2 10*3/uL (ref 0.0–0.5)
Eosinophils Relative: 3 %
HCT: 41.7 % (ref 36.0–46.0)
Hemoglobin: 13.5 g/dL (ref 12.0–15.0)
Immature Granulocytes: 0 %
Lymphocytes Relative: 18 %
Lymphs Abs: 1.4 10*3/uL (ref 0.7–4.0)
MCH: 26.2 pg (ref 26.0–34.0)
MCHC: 32.4 g/dL (ref 30.0–36.0)
MCV: 81 fL (ref 80.0–100.0)
Monocytes Absolute: 0.3 10*3/uL (ref 0.1–1.0)
Monocytes Relative: 3 %
Neutro Abs: 6.2 10*3/uL (ref 1.7–7.7)
Neutrophils Relative %: 76 %
Platelets: 374 10*3/uL (ref 150–400)
RBC: 5.15 MIL/uL — ABNORMAL HIGH (ref 3.87–5.11)
RDW: 14.2 % (ref 11.5–15.5)
WBC: 8.1 10*3/uL (ref 4.0–10.5)
nRBC: 0 % (ref 0.0–0.2)

## 2019-04-04 LAB — URINE DRUG SCREEN, QUALITATIVE (ARMC ONLY)
Amphetamines, Ur Screen: POSITIVE — AB
Barbiturates, Ur Screen: NOT DETECTED
Benzodiazepine, Ur Scrn: POSITIVE — AB
Cannabinoid 50 Ng, Ur ~~LOC~~: NOT DETECTED
Cocaine Metabolite,Ur ~~LOC~~: POSITIVE — AB
MDMA (Ecstasy)Ur Screen: NOT DETECTED
Methadone Scn, Ur: NOT DETECTED
Opiate, Ur Screen: POSITIVE — AB
Phencyclidine (PCP) Ur S: NOT DETECTED
Tricyclic, Ur Screen: NOT DETECTED

## 2019-04-04 LAB — ACETAMINOPHEN LEVEL: Acetaminophen (Tylenol), Serum: <10 ug/mL — ABNORMAL LOW (ref 10–30)

## 2019-04-04 LAB — SALICYLATE LEVEL: Salicylate Lvl: <7 mg/dL — ABNORMAL LOW (ref 7.0–30.0)

## 2019-04-04 LAB — RESPIRATORY PANEL BY RT PCR (FLU A&B, COVID)
Influenza A by PCR: NEGATIVE
Influenza B by PCR: NEGATIVE
SARS Coronavirus 2 by RT PCR: NEGATIVE

## 2019-04-04 LAB — ETHANOL: Alcohol, Ethyl (B): <10 mg/dL

## 2019-04-04 MED ORDER — NITROFURANTOIN MONOHYD MACRO 100 MG PO CAPS
100.0000 mg | ORAL_CAPSULE | Freq: Once | ORAL | Status: AC
  Administered 2019-04-04: 18:00:00 100 mg via ORAL
  Filled 2019-04-04: qty 1
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
## 2019-04-04 NOTE — ED Notes (Signed)
Joyce Gross (pt mother) called and notified that patient is being discharged. Joyce Gross states she is on way to pick up patient but would take 25-30 mins due to her living in [place]. I advised her to call this Clinical research associate when she is here and I would have patient brought out to her.

## 2019-04-04 NOTE — ED Notes (Signed)
Father called asking if patient was here and if he can speak with her.  I advised that I was not able to give that information out at this time due to HIPPA. Father states thank you and hung up.
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
## 2019-04-04 NOTE — ED Provider Notes (Addendum)
[facility] Emergency Department Provider Note       Time seen: ----------------------------------------- 3:33 PM on 04/04/2019 -----------------------------------------   I have reviewed the triage vital signs and the nursing notes.  HISTORY   Chief Complaint Mental Health Problem    HPI Kathy [name] is a 34 y.o. female with a history of anxiety, asthma, bipolar disorder, fibromyalgia, PTSD, seizures who presents to the ED for acute psychosis.  Patient is brought by police from [facility] for an evaluation.  Patient is difficult to understand and has been mumbling when she speaks.  Patient states she is 3-1/2 months pregnant.  Past Medical History:  Diagnosis Date  . Anxiety   . Asthma   . Bipolar disorder (HCC)   . Compression fracture of spine (HCC)   . Fibromyalgia   . Headache   . PTSD (post-traumatic stress disorder)   . Seizures (HCC)    Related to benzo withdrawal    Patient Active Problem List   Diagnosis Date Noted  . Opiate overdose (HCC)   . Polysubstance (excluding opioids) dependence, daily use (HCC) 11/30/2016  . Polysubstance dependence including opioid type drug, continuous use (HCC) 11/30/2016  . History of 3 spontaneous abortions 03/24/2016  . Eczema 03/24/2016  . Depression 03/24/2016  . Asthma without status asthmaticus without complication 03/24/2016  . Anxiety 03/24/2016  . Opiate abuse, continuous (HCC) 03/24/2016  . Fibromyalgia 04/17/2015  . Chronic hepatitis C virus infection (HCC) 02/28/2015  . Chronic migraine 07/23/2014  . Bipolar disorder (HCC) 05/10/2013  . PTSD (post-traumatic stress disorder) 05/10/2013  . Polysubstance abuse (HCC) 10/03/2012    Past Surgical History:  Procedure Laterality Date  . HAND SURGERY Right   . HAND SURGERY      Allergies Patient has no known allergies.  Social History Social History   Tobacco Use  . Smoking status: Current Every Day Smoker    Packs/day: 1.00    Types:  Cigarettes  . Smokeless tobacco: Never Used  . Tobacco comment: Pt does not want to stop smoking at this time  Substance Use Topics  . Alcohol use: Never    Comment: Daily. Last drink: PTA  from [facility], Drinks beer and liquor.   . Drug use: Yes    Types: Cocaine, Benzodiazepines, Heroin, Hydrocodone    Comment: Cocaine: last used; Heroin: last used 09/17/2016;     Review of Systems Constitutional: Negative for fever. Cardiovascular: Negative for chest pain. Respiratory: Negative for shortness of breath. Gastrointestinal: Negative for abdominal pain, vomiting and diarrhea. Musculoskeletal: Negative for back pain. Skin: Negative for rash. Neurological: Negative for headaches, focal weakness or numbness.  All systems negative/normal/unremarkable except as stated in the HPI  ____________________________________________   PHYSICAL EXAM:  VITAL SIGNS: ED Triage Vitals [04/04/19 1440]  Enc Vitals Group     BP (!) 145/112     Pulse Rate 87     Resp 18     Temp 98 F (36.7 C)     Temp src      SpO2 98 %     Weight 150 lb (68 kg)     Height 5\' 3"  (1.6 m)     Head Circumference      Peak Flow      Pain Score 0     Pain Loc      Pain Edu?      Excl. in GC?     Constitutional: Drowsy, but responds easily to verbal stimuli Eyes: Conjunctivae are normal. Normal extraocular movements. Cardiovascular:  Normal rate, regular rhythm. No murmurs, rubs, or gallops. Respiratory: Bilateral wheezing is noted Gastrointestinal: Soft and nontender. Normal bowel sounds Musculoskeletal: Nontender with normal range of motion in extremities. No lower extremity tenderness nor edema. Neurologic: Slurred speech, no gross focal neurologic deficits are appreciated.  Skin:  Skin is warm, dry and intact. No rash noted. Psychiatric: Slurred speech, drowsy, tangential speech  ____________________________________________  ED COURSE:  As part of my medical decision making, I reviewed the following data  within the [place] History obtained from family if available, nursing notes, old chart and ekg, as well as notes from prior ED visits. Patient presented for acute psychosis, we will assess with labs and imaging as indicated at this time. Clinical Course as of Apr 04 1931  Wed Apr 04, 2019  1922 IMPRESSION: Single live intrauterine pregnancy. No acute findings.  This exam is performed on an emergent basis and does not comprehensively evaluate fetal size, dating, or anatomy; follow-up complete OB US should be considered if further fetal assessment is warranted.      [JW]    Clinical Course User Index [JW] Earleen Newport, MD   Procedures  Deane Melick was evaluated in Emergency Department on 04/04/2019 for the symptoms described in the history of present illness. She was evaluated in the context of the global COVID-19 pandemic, which necessitated consideration that the patient might be at risk for infection with the SARS-CoV-2 virus that causes COVID-19. Institutional protocols and algorithms that pertain to the evaluation of patients at risk for COVID-19 are in a state of rapid change based on information released by regulatory bodies including the CDC and federal and state organizations. These policies and algorithms were followed during the patient's care in the ED.  ____________________________________________   LABS (pertinent positives/negatives)  Labs Reviewed  CBC WITH DIFFERENTIAL/PLATELET - Abnormal; Notable for the following components:      Result Value   RBC 5.15 (*)    All other components within normal limits  COMPREHENSIVE METABOLIC PANEL - Abnormal; Notable for the following components:   Total Protein 9.7 (*)    AST 100 (*)    ALT 109 (*)    All other components within normal limits  URINALYSIS, COMPLETE (UACMP) WITH MICROSCOPIC - Abnormal; Notable for the following components:   Color, Urine AMBER (*)    APPearance CLOUDY (*)    Hgb  urine dipstick SMALL (*)    Ketones, ur 5 (*)    Protein, ur 100 (*)    Nitrite POSITIVE (*)    Leukocytes,Ua TRACE (*)    Bacteria, UA MANY (*)    All other components within normal limits  URINE DRUG SCREEN, QUALITATIVE ([facility] ONLY) - Abnormal; Notable for the following components:   Amphetamines, Ur Screen POSITIVE (*)    Cocaine Metabolite,Ur [place] POSITIVE (*)    Opiate, Ur Screen POSITIVE (*)    Benzodiazepine, Ur Scrn POSITIVE (*)    All other components within normal limits  ACETAMINOPHEN LEVEL - Abnormal; Notable for the following components:   Acetaminophen (Tylenol), Serum <10 (*)    All other components within normal limits  SALICYLATE LEVEL - Abnormal; Notable for the following components:   Salicylate Lvl <4.2 (*)    All other components within normal limits  POCT PREGNANCY, URINE - Abnormal; Notable for the following components:   Preg Test, Ur POSITIVE (*)    All other components within normal limits  URINE CULTURE  ETHANOL  HCG, QUANTITATIVE, PREGNANCY  CBG  MONITORING, ED   ____________________________________________   DIFFERENTIAL DIAGNOSIS   Acute psychosis, substance abuse, intoxication, dehydration, electrolyte abnormality, pregnancy  FINAL ASSESSMENT AND PLAN  Polysubstance abuse, UTI   Plan: The patient had presented for altered mental status and possible psychosis or intoxication.  Patient's labs did indicate a urinary tract infection and polysubstance abuse.  We have sent a urine cultures and start her on Macrobid.  Talk screen is positive for multiple drugs as expected.  She is pending psychiatric evaluation and disposition at this time.   Ulice Dash, MD    Note: This note was generated in part or whole with voice recognition software. Voice recognition is usually quite accurate but there are transcription errors that can and very often do occur. I apologize for any typographical errors that were not detected and corrected.     Emily Filbert, MD 04/04/19 1612    Emily Filbert, MD 04/04/19 1635    Emily Filbert, MD 04/04/19 [id number]

## 2019-04-04 NOTE — ED Notes (Addendum)
Pt admits to using drugs. Pt states last used was last night around 9pm. Pt states she used heroin and fent. Pt states she is unsure of what else the girl had that she used.   Pt also states she took 1 mg of Klonopin and a piece of suboxane.  Pt is dozing in and out. Pt mumbling words and hard to understand.

## 2019-04-04 NOTE — ED Notes (Signed)
Spoke with mother Joyce Gross. Patient gave permission to speak with mother about medication. Mother states she thinks patient is preg and is worried about her using drugs and would like her to stay here. I advised to mother that would could not force patient to be here unless she is IVC and currently patient is denying SI/HI/AVH. Mother states if patient is discharged she is willing to pick up if discharge soon.

## 2019-04-04 NOTE — BH Assessment (Signed)
Assessment Note  Kathy [name] is an 34 y.o. female. Who presents via [facility] from [facility] for eval. Pt difficult to understand due to mumbling when she speaks. She shares that she had an argument with someone whom she is sharing hotel with because they threw her belongings out side. Pt admits to using drugs and shares that she is pregnant.She states that she thought that [facility] sent her here to ensure that she did not have COVID. Pt. denies any suicidal ideation, plan or intent. Pt. denies the presence of any auditory or visual hallucinations at this time. Patient denies any other medical complaints.    Diagnosis: Substance induced mood disorder    Past Medical History:  Past Medical History:  Diagnosis Date  . Anxiety   . Asthma   . Bipolar disorder (HCC)   . Compression fracture of spine (HCC)   . Fibromyalgia   . Headache   . PTSD (post-traumatic stress disorder)   . Seizures (HCC)    Related to benzo withdrawal    Past Surgical History:  Procedure Laterality Date  . HAND SURGERY Right   . HAND SURGERY      Family History: No family history on file.  Social History:  reports that she has been smoking cigarettes. She has been smoking about 1.00 pack per day. She has never used smokeless tobacco. She reports current drug use. Drugs: Cocaine, Benzodiazepines, Heroin, and Hydrocodone. She reports that she does not drink alcohol.  Additional Social History:  Alcohol / Drug Use Pain Medications: See PTA Prescriptions: See PTA Over the Counter: See PTA History of alcohol / drug use?: Yes Substance #1 Name of Substance 1: KOLINIPIN 1 - Age of First Use: UTA 1 - Amount (size/oz): 1MG  1 - Frequency: Daily 1 - Duration: ongoing 1 - Last Use / Amount: PTA Substance #2 Name of Substance 2: Heroin  CIWA: CIWA-Ar BP: (!) 145/112 Pulse Rate: 87 COWS:    Allergies: No Known Allergies  Home Medications: (Not in a hospital admission)   OB/GYN Status:  No LMP recorded (lmp  unknown). Patient is pregnant.  General Assessment Data Location of Assessment: [facility] ED TTS Assessment: In system Is this a Tele or Face-to-Face Assessment?: Face-to-Face Is this an Initial Assessment or a Re-assessment for this encounter?: Initial Assessment Language Other than English: No Living Arrangements: Other (Comment) What gender do you identify as?: Female Marital status: Single Pregnancy Status: No Living Arrangements: Parent Can pt return to current living arrangement?: No Admission Status: Voluntary Is patient capable of signing voluntary admission?: Yes Referral Source: Self/Family/Friend Insurance type: None  Medical Screening Exam [facility] Walk-in ONLY) Medical Exam completed: Yes  Crisis Care Plan Living Arrangements: Parent Legal Guardian: Other:  Education Status Is patient currently in school?: No Is the patient employed, unemployed or receiving disability?: Unemployed  Risk to self with the past 6 months Suicidal Ideation: No Has patient been a risk to self within the past 6 months prior to admission? : No Suicidal Intent: No Has patient had any suicidal intent within the past 6 months prior to admission? : No Is patient at risk for suicide?: No Suicidal Plan?: No Has patient had any suicidal plan within the past 6 months prior to admission? : No Access to Means: No Previous Attempts/Gestures: No How many times?: 0 Triggers for Past Attempts: None known Intentional Self Injurious Behavior: None Family Suicide History: Unknown Recent stressful life event(s): Other (Comment) Persecutory voices/beliefs?: No Depression: No Depression Symptoms: Guilt Substance abuse history and/or  treatment for substance abuse?: Yes  Risk to Others within the past 6 months Homicidal Ideation: No Does patient have any lifetime risk of violence toward others beyond the six months prior to admission? : No Thoughts of Harm to Others: No Current Homicidal Intent:  No Access to Homicidal Means: No History of harm to others?: No Assessment of Violence: None Noted Does patient have access to weapons?: No Criminal Charges Pending?: No Does patient have a court date: No Is patient on probation?: No  Psychosis Hallucinations: None noted Delusions: None noted  Mental Status Report Appearance/Hygiene: Unremarkable, In scrubs Eye Contact: Fair Motor Activity: Freedom of movement Speech: Logical/coherent, Unremarkable Level of Consciousness: Drowsy Mood: Guilty Affect: Anxious, Sad Anxiety Level: Minimal Thought Processes: Irrelevant Judgement: Partial Orientation: Person, Place, Time, Situation, Appropriate for developmental age Obsessive Compulsive Thoughts/Behaviors: Minimal  Cognitive Functioning Concentration: Decreased Memory: Recent Intact Is patient IDD: No Insight: Fair Impulse Control: Fair Appetite: Good Have you had any weight changes? : No Change Sleep: No Change Total Hours of Sleep: 7 Vegetative Symptoms: None  ADLScreening [facility] Assessment Services) Patient's cognitive ability adequate to safely complete daily activities?: Yes Patient able to express need for assistance with ADLs?: Yes Independently performs ADLs?: Yes (appropriate for developmental age)  Prior Inpatient Therapy Prior Inpatient Therapy: Yes Prior Therapy Dates: 03/2017, 11/2016, 06/2016 & 03/2016 Prior Therapy Facilty/Provider(s): [facility] Reason for Treatment: Substance Abuse Treatment  Prior Outpatient Therapy Prior Outpatient Therapy: No Does patient have an ACCT team?: No Does patient have Intensive In-House Services?  : No Does patient have [facility] services? : No Does patient have P4CC services?: No  ADL Screening (condition at time of admission) Patient's cognitive ability adequate to safely complete daily activities?: Yes Patient able to express need for assistance with ADLs?: Yes Independently performs ADLs?: Yes  (appropriate for developmental age)       Abuse/Neglect Assessment (Assessment to be complete while patient is alone) Abuse/Neglect Assessment Can Be Completed: Yes Physical Abuse: Denies Verbal Abuse: Denies Exploitation of patient/patient's resources: Denies Self-Neglect: Denies   Consults Spiritual Care Consult Needed: No Transition of Care Team Consult Needed: No            Disposition:  Disposition Initial Assessment Completed for this Encounter: Yes Disposition of Patient: Discharge  On Site Evaluation by:   Reviewed with Physician:    Laretta Alstrom 04/04/2019 9:05 PM

## 2019-04-04 NOTE — ED Notes (Signed)
Pt dressed out into hospital scrubs. Pt's belongings to include:  1 black jacket 1 black tanktop 1 black bra 2 black and white shoes 2 white socks 1 pair of gray pants 1 leopard print underwear 6 rings 1 yellow chain 1 fake hair piece 1 chapstick 1 lipstick 1 bellyring 1 blue white bracelet 1 yellow bracelet 1 green hairbow 47 cents

## 2019-04-04 NOTE — ED Triage Notes (Signed)
Pt in via [facility] from [facility] for eval. Pt difficult to understand due to mumbling when she speaks.Pt also 3.5 months pregnant. Denies SI

## 2019-04-04 NOTE — ED Provider Notes (Signed)
Patient certainly with substance abuse, denies SI/HI. Not committable at this time. Pregnancy is stable. She is cleared for outpatient follow up.    Emily Filbert, MD 04/04/19 2037

## 2019-04-04 NOTE — ED Notes (Addendum)
Attempted to listen for fetal heart tones. Unable to hear any at this time.

## 2019-04-06 LAB — URINE CULTURE: Culture: >=100000 — AB

## 2019-04-07 NOTE — Progress Notes (Signed)
ED Antimicrobial Stewardship Positive Culture Follow Up   Kathy [name] is an 34 y.o. female who presented to [facility] on 04/04/2019 with a chief complaint of  Chief Complaint  Patient presents with  . Mental Health Problem    Recent Results (from the past 720 hour(s))  Respiratory Panel by RT PCR (Flu A&B, Covid) - Nasopharyngeal Swab     Status: None   Collection Time: 03/28/19  1:19 PM   Specimen: Nasopharyngeal Swab  Result Value Ref Range Status   SARS Coronavirus 2 by RT PCR NEGATIVE NEGATIVE Final    Comment: (NOTE) SARS-CoV-2 target nucleic acids are NOT DETECTED. The SARS-CoV-2 RNA is generally detectable in upper respiratoy specimens during the acute phase of infection. The lowest concentration of SARS-CoV-2 viral copies this assay can detect is 131 copies/mL. A negative result does not preclude SARS-Cov-2 infection and should not be used as the sole basis for treatment or other patient management decisions. A negative result may occur with  improper specimen collection/handling, submission of specimen other than nasopharyngeal swab, presence of viral mutation(s) within the areas targeted by this assay, and inadequate number of viral copies (<131 copies/mL). A negative result must be combined with clinical observations, patient history, and epidemiological information. The expected result is Negative. Fact Sheet for Patients:  https://www.moore.com/ Fact Sheet for Healthcare Providers:  https://www.young.biz/ This test is not yet ap proved or cleared by the [place] FDA and  has been authorized for detection and/or diagnosis of SARS-CoV-2 by FDA under an Emergency Use Authorization (EUA). This EUA will remain  in effect (meaning this test can be used) for the duration of the COVID-19 declaration under Section 564(b)(1) of the Act, 21 U.S.C. section 360bbb-3(b)(1), unless the authorization is terminated or revoked sooner.     Influenza A by PCR NEGATIVE NEGATIVE Final   Influenza B by PCR NEGATIVE NEGATIVE Final    Comment: (NOTE) The Xpert Xpress SARS-CoV-2/FLU/RSV assay is intended as an aid in  the diagnosis of influenza from Nasopharyngeal swab specimens and  should not be used as a sole basis for treatment. Nasal washings and  aspirates are unacceptable for Xpert Xpress SARS-CoV-2/FLU/RSV  testing. Fact Sheet for Patients: https://www.moore.com/ Fact Sheet for Healthcare Providers: https://www.young.biz/ This test is not yet approved or cleared by the [place] FDA and  has been authorized for detection and/or diagnosis of SARS-CoV-2 by  FDA under an Emergency Use Authorization (EUA). This EUA will remain  in effect (meaning this test can be used) for the duration of the  Covid-19 declaration under Section 564(b)(1) of the Act, 21  U.S.C. section 360bbb-3(b)(1), unless the authorization is  terminated or revoked. Performed at [facility], [address] [place], [place] [address]   Urine Culture     Status: Abnormal   Collection Time: 04/04/19  2:38 PM   Specimen: Urine, Random  Result Value Ref Range Status   Specimen Description   Final    URINE, RANDOM Performed at [facility], [address] [place], [place] [address]    Special Requests   Final    NONE Performed at [facility], [address] [place], [place] [address]    Culture >=100,000 COLONIES/mL ESCHERICHIA COLI (A)  Final   Report Status 04/06/2019 FINAL  Final   Organism ID, Bacteria ESCHERICHIA COLI (A)  Final      Susceptibility   Escherichia coli - MIC*    AMPICILLIN >=32 RESISTANT Resistant     CEFAZOLIN <=4  SENSITIVE Sensitive     CEFTRIAXONE <=0.25 SENSITIVE Sensitive     CIPROFLOXACIN <=0.25 SENSITIVE Sensitive     GENTAMICIN <=1 SENSITIVE Sensitive     IMIPENEM <=0.25 SENSITIVE Sensitive     NITROFURANTOIN 64 INTERMEDIATE Intermediate      TRIMETH/SULFA >=320 RESISTANT Resistant     AMPICILLIN/SULBACTAM >=32 RESISTANT Resistant     PIP/TAZO <=4 SENSITIVE Sensitive     * >=100,000 COLONIES/mL ESCHERICHIA COLI  Respiratory Panel by RT PCR (Flu A&B, Covid) - Nasopharyngeal Swab     Status: None   Collection Time: 04/04/19  7:53 PM   Specimen: Nasopharyngeal Swab  Result Value Ref Range Status   SARS Coronavirus 2 by RT PCR NEGATIVE NEGATIVE Final    Comment: (NOTE) SARS-CoV-2 target nucleic acids are NOT DETECTED. The SARS-CoV-2 RNA is generally detectable in upper respiratoy specimens during the acute phase of infection. The lowest concentration of SARS-CoV-2 viral copies this assay can detect is 131 copies/mL. A negative result does not preclude SARS-Cov-2 infection and should not be used as the sole basis for treatment or other patient management decisions. A negative result may occur with  improper specimen collection/handling, submission of specimen other than nasopharyngeal swab, presence of viral mutation(s) within the areas targeted by this assay, and inadequate number of viral copies (<131 copies/mL). A negative result must be combined with clinical observations, patient history, and epidemiological information. The expected result is Negative. Fact Sheet for Patients:  PinkCheek.be Fact Sheet for Healthcare Providers:  GravelBags.it This test is not yet ap proved or cleared by the [place] FDA and  has been authorized for detection and/or diagnosis of SARS-CoV-2 by FDA under an Emergency Use Authorization (EUA). This EUA will remain  in effect (meaning this test can be used) for the duration of the COVID-19 declaration under Section 564(b)(1) of the Act, 21 U.S.C. section 360bbb-3(b)(1), unless the authorization is terminated or revoked sooner.    Influenza A by PCR NEGATIVE NEGATIVE Final   Influenza B by PCR NEGATIVE NEGATIVE Final    Comment:  (NOTE) The Xpert Xpress SARS-CoV-2/FLU/RSV assay is intended as an aid in  the diagnosis of influenza from Nasopharyngeal swab specimens and  should not be used as a sole basis for treatment. Nasal washings and  aspirates are unacceptable for Xpert Xpress SARS-CoV-2/FLU/RSV  testing. Fact Sheet for Patients: PinkCheek.be Fact Sheet for Healthcare Providers: GravelBags.it This test is not yet approved or cleared by the [place] FDA and  has been authorized for detection and/or diagnosis of SARS-CoV-2 by  FDA under an Emergency Use Authorization (EUA). This EUA will remain  in effect (meaning this test can be used) for the duration of the  Covid-19 declaration under Section 564(b)(1) of the Act, 21  U.S.C. section 360bbb-3(b)(1), unless the authorization is  terminated or revoked. Performed at [facility], [address], [place], [place] [address]     [x]  Patient discharged originally without antimicrobial agent and treatment is now indicated  New antibiotic prescription: Cephalexin 500 mg q8h x 7 days  ED Provider: Dr. Joni Fears  Note: Patient is a 34 y/o pregnant female who presented to [facility] ED 2/17 with acute psychosis. UDS positive for multiple illicit substances. Urine culture consistent with asymptomatic bacteriuria which is indicated for treatment in pregnant females. Un-able to reach patient via telephone and VM un-available. Will try again tomorrow. Plan is to make contact with patient, review treatment plan, and call in prescription to pharmacy of choice.   Axel Filler  [facility] Pharmacy Resident 04/07/2019, 3:38 PM

## 2019-04-09 NOTE — Progress Notes (Signed)
ED Antimicrobial Stewardship Positive Culture Follow Up   Kathy [name] is an 34 y.o. female who presented to [facility] on 04/04/2019 with a chief complaint of  Chief Complaint  Patient presents with  . Mental Health Problem    Recent Results (from the past 720 hour(s))  Respiratory Panel by RT PCR (Flu A&B, Covid) - Nasopharyngeal Swab     Status: None   Collection Time: 03/28/19  1:19 PM   Specimen: Nasopharyngeal Swab  Result Value Ref Range Status   SARS Coronavirus 2 by RT PCR NEGATIVE NEGATIVE Final    Comment: (NOTE) SARS-CoV-2 target nucleic acids are NOT DETECTED. The SARS-CoV-2 RNA is generally detectable in upper respiratoy specimens during the acute phase of infection. The lowest concentration of SARS-CoV-2 viral copies this assay can detect is 131 copies/mL. A negative result does not preclude SARS-Cov-2 infection and should not be used as the sole basis for treatment or other patient management decisions. A negative result may occur with  improper specimen collection/handling, submission of specimen other than nasopharyngeal swab, presence of viral mutation(s) within the areas targeted by this assay, and inadequate number of viral copies (<131 copies/mL). A negative result must be combined with clinical observations, patient history, and epidemiological information. The expected result is Negative. Fact Sheet for Patients:  https://www.moore.com/ Fact Sheet for Healthcare Providers:  https://www.young.biz/ This test is not yet ap proved or cleared by the [place] FDA and  has been authorized for detection and/or diagnosis of SARS-CoV-2 by FDA under an Emergency Use Authorization (EUA). This EUA will remain  in effect (meaning this test can be used) for the duration of the COVID-19 declaration under Section 564(b)(1) of the Act, 21 U.S.C. section 360bbb-3(b)(1), unless the authorization is terminated or revoked sooner.     Influenza A by PCR NEGATIVE NEGATIVE Final   Influenza B by PCR NEGATIVE NEGATIVE Final    Comment: (NOTE) The Xpert Xpress SARS-CoV-2/FLU/RSV assay is intended as an aid in  the diagnosis of influenza from Nasopharyngeal swab specimens and  should not be used as a sole basis for treatment. Nasal washings and  aspirates are unacceptable for Xpert Xpress SARS-CoV-2/FLU/RSV  testing. Fact Sheet for Patients: https://www.moore.com/ Fact Sheet for Healthcare Providers: https://www.young.biz/ This test is not yet approved or cleared by the [place] FDA and  has been authorized for detection and/or diagnosis of SARS-CoV-2 by  FDA under an Emergency Use Authorization (EUA). This EUA will remain  in effect (meaning this test can be used) for the duration of the  Covid-19 declaration under Section 564(b)(1) of the Act, 21  U.S.C. section 360bbb-3(b)(1), unless the authorization is  terminated or revoked. Performed at [facility], [address] [place], [place] [address]   Urine Culture     Status: Abnormal   Collection Time: 04/04/19  2:38 PM   Specimen: Urine, Random  Result Value Ref Range Status   Specimen Description   Final    URINE, RANDOM Performed at [facility], [address] [place], [place] [address]    Special Requests   Final    NONE Performed at [facility], [address] [place], [place] [address]    Culture >=100,000 COLONIES/mL ESCHERICHIA COLI (A)  Final   Report Status 04/06/2019 FINAL  Final   Organism ID, Bacteria ESCHERICHIA COLI (A)  Final      Susceptibility   Escherichia coli - MIC*    AMPICILLIN >=32 RESISTANT Resistant     CEFAZOLIN <=4  SENSITIVE Sensitive     CEFTRIAXONE <=0.25 SENSITIVE Sensitive     CIPROFLOXACIN <=0.25 SENSITIVE Sensitive     GENTAMICIN <=1 SENSITIVE Sensitive     IMIPENEM <=0.25 SENSITIVE Sensitive     NITROFURANTOIN 64 INTERMEDIATE Intermediate      TRIMETH/SULFA >=320 RESISTANT Resistant     AMPICILLIN/SULBACTAM >=32 RESISTANT Resistant     PIP/TAZO <=4 SENSITIVE Sensitive     * >=100,000 COLONIES/mL ESCHERICHIA COLI  Respiratory Panel by RT PCR (Flu A&B, Covid) - Nasopharyngeal Swab     Status: None   Collection Time: 04/04/19  7:53 PM   Specimen: Nasopharyngeal Swab  Result Value Ref Range Status   SARS Coronavirus 2 by RT PCR NEGATIVE NEGATIVE Final    Comment: (NOTE) SARS-CoV-2 target nucleic acids are NOT DETECTED. The SARS-CoV-2 RNA is generally detectable in upper respiratoy specimens during the acute phase of infection. The lowest concentration of SARS-CoV-2 viral copies this assay can detect is 131 copies/mL. A negative result does not preclude SARS-Cov-2 infection and should not be used as the sole basis for treatment or other patient management decisions. A negative result may occur with  improper specimen collection/handling, submission of specimen other than nasopharyngeal swab, presence of viral mutation(s) within the areas targeted by this assay, and inadequate number of viral copies (<131 copies/mL). A negative result must be combined with clinical observations, patient history, and epidemiological information. The expected result is Negative. Fact Sheet for Patients:  PinkCheek.be Fact Sheet for Healthcare Providers:  GravelBags.it This test is not yet ap proved or cleared by the [place] FDA and  has been authorized for detection and/or diagnosis of SARS-CoV-2 by FDA under an Emergency Use Authorization (EUA). This EUA will remain  in effect (meaning this test can be used) for the duration of the COVID-19 declaration under Section 564(b)(1) of the Act, 21 U.S.C. section 360bbb-3(b)(1), unless the authorization is terminated or revoked sooner.    Influenza A by PCR NEGATIVE NEGATIVE Final   Influenza B by PCR NEGATIVE NEGATIVE Final    Comment:  (NOTE) The Xpert Xpress SARS-CoV-2/FLU/RSV assay is intended as an aid in  the diagnosis of influenza from Nasopharyngeal swab specimens and  should not be used as a sole basis for treatment. Nasal washings and  aspirates are unacceptable for Xpert Xpress SARS-CoV-2/FLU/RSV  testing. Fact Sheet for Patients: PinkCheek.be Fact Sheet for Healthcare Providers: GravelBags.it This test is not yet approved or cleared by the [place] FDA and  has been authorized for detection and/or diagnosis of SARS-CoV-2 by  FDA under an Emergency Use Authorization (EUA). This EUA will remain  in effect (meaning this test can be used) for the duration of the  Covid-19 declaration under Section 564(b)(1) of the Act, 21  U.S.C. section 360bbb-3(b)(1), unless the authorization is  terminated or revoked. Performed at [facility], [address], [place], [place] [address]     [x]  Patient discharged originally without antimicrobial agent and treatment is now indicated  New antibiotic prescription: Cephalexin 500 mg q8h x 7 days  ED Provider: Dr. Joni Fears  Note: Patient is a 34 y/o pregnant female who presented to [facility] ED 2/17 with acute psychosis. UDS positive for multiple illicit substances. Urine culture consistent with asymptomatic bacteriuria which is indicated for treatment in pregnant females. Un-able to reach patient via telephone and VM un-available. Will try again tomorrow. Plan is to make contact with patient, review treatment plan, and call in prescription to pharmacy of choice.   Update: Un-able  to reach patient x 3 days. Pharmacy will no longer attempt to make contact.   Tressie Ellis Pharmacy Resident 04/09/2019, 1:40 PM

## 2019-04-19 ENCOUNTER — Other Ambulatory Visit: Payer: Self-pay

## 2019-04-19 ENCOUNTER — Encounter: Payer: Self-pay | Admitting: Emergency Medicine

## 2019-04-19 ENCOUNTER — Encounter (HOSPITAL_COMMUNITY): Payer: Self-pay | Admitting: Emergency Medicine

## 2019-04-19 ENCOUNTER — Emergency Department (HOSPITAL_COMMUNITY)
Admission: EM | Admit: 2019-04-19 | Discharge: 2019-04-19 | Disposition: A | Discharge status: Home / Self Care | Payer: Self-pay | Attending: Emergency Medicine | Admitting: Emergency Medicine

## 2019-04-19 DIAGNOSIS — T50904A Poisoning by unspecified drugs, medicaments and biological substances, undetermined, initial encounter: Secondary | ICD-10-CM | POA: Insufficient documentation

## 2019-04-19 LAB — COMPREHENSIVE METABOLIC PANEL
ALT: 85 U/L — ABNORMAL HIGH (ref 0–44)
AST: 80 U/L — ABNORMAL HIGH (ref 15–41)
Albumin: 3.1 g/dL — ABNORMAL LOW (ref 3.5–5.0)
Alkaline Phosphatase: 74 U/L (ref 38–126)
Anion gap: 8 (ref 5–15)
BUN: 10 mg/dL (ref 6–20)
CO2: 26 mmol/L (ref 22–32)
Calcium: 8.5 mg/dL — ABNORMAL LOW (ref 8.9–10.3)
Chloride: 100 mmol/L (ref 98–111)
Creatinine, Ser: 0.53 mg/dL (ref 0.44–1.00)
GFR calc Af Amer: >60 mL/min (ref >60)
GFR calc non Af Amer: >60 mL/min (ref >60)
Glucose, Bld: 97 mg/dL (ref 70–99)
Potassium: 3.6 mmol/L (ref 3.5–5.1)
Sodium: 134 mmol/L — ABNORMAL LOW (ref 135–145)
Total Bilirubin: 0.6 mg/dL (ref 0.3–1.2)
Total Protein: 7.7 g/dL (ref 6.5–8.1)

## 2019-04-19 LAB — CBC WITH DIFFERENTIAL/PLATELET
Abs Immature Granulocytes: 0.04 10*3/uL (ref 0.00–0.07)
Basophils Absolute: 0 10*3/uL (ref 0.0–0.1)
Basophils Relative: 0 %
Eosinophils Absolute: 0.1 10*3/uL (ref 0.0–0.5)
Eosinophils Relative: 1 %
HCT: 34.5 % — ABNORMAL LOW (ref 36.0–46.0)
Hemoglobin: 10.9 g/dL — ABNORMAL LOW (ref 12.0–15.0)
Immature Granulocytes: 0 %
Lymphocytes Relative: 14 %
Lymphs Abs: 1.4 10*3/uL (ref 0.7–4.0)
MCH: 26.1 pg (ref 26.0–34.0)
MCHC: 31.6 g/dL (ref 30.0–36.0)
MCV: 82.7 fL (ref 80.0–100.0)
Monocytes Absolute: 0.5 10*3/uL (ref 0.1–1.0)
Monocytes Relative: 6 %
Neutro Abs: 7.5 10*3/uL (ref 1.7–7.7)
Neutrophils Relative %: 79 %
Platelets: 275 10*3/uL (ref 150–400)
RBC: 4.17 MIL/uL (ref 3.87–5.11)
RDW: 14.1 % (ref 11.5–15.5)
WBC: 9.6 10*3/uL (ref 4.0–10.5)
nRBC: 0 % (ref 0.0–0.2)

## 2019-04-19 LAB — ETHANOL: Alcohol, Ethyl (B): <10 mg/dL (ref <10)

## 2019-04-19 LAB — ACETAMINOPHEN LEVEL: Acetaminophen (Tylenol), Serum: <10 ug/mL — ABNORMAL LOW (ref 10–30)

## 2019-04-19 LAB — SALICYLATE LEVEL: Salicylate Lvl: <7 mg/dL — ABNORMAL LOW (ref 7.0–30.0)

## 2019-04-19 MED ORDER — NALOXONE HCL 2 MG/2ML IJ SOSY
PREFILLED_SYRINGE | INTRAMUSCULAR | Status: AC
  Administered 2019-04-19: 1 mg via INTRAVENOUS
  Filled 2019-04-19: qty 2

## 2019-04-19 MED ORDER — NALOXONE HCL 0.4 MG/ML IJ SOLN: 0.4000 mg | Freq: Once | INTRAMUSCULAR | Status: DC

## 2019-04-19 MED ORDER — NALOXONE HCL 2 MG/2ML IJ SOSY: 1.0000 mg | PREFILLED_SYRINGE | Freq: Once | INTRAMUSCULAR | Status: AC

## 2019-04-19 MED ORDER — SODIUM CHLORIDE 0.9 % IV BOLUS: 1000.0000 mL | Freq: Once | INTRAVENOUS | Status: DC

## 2019-04-19 MED ORDER — ONDANSETRON 8 MG PO TBDP: 8.0000 mg | ORAL_TABLET | Freq: Once | ORAL | Status: DC

## 2019-04-19 NOTE — ED Notes (Signed)
Pt arousals easy.  Able to tell me her name.  Pt says she has 3 kids.  Pt does also nod to "yes/no" questions.

## 2019-04-19 NOTE — ED Notes (Signed)
Family at bedside per Dr Patsey Berthold request.  MD aware.

## 2019-04-19 NOTE — ED Notes (Signed)
Unsuccessful IV attempt x2.  

## 2019-04-19 NOTE — ED Provider Notes (Addendum)
[facility] EMERGENCY DEPARTMENT Provider Note   CSN: [id number] Arrival date & time: 04/19/19  8338     History Chief Complaint  Patient presents with  . Ingestion    Kathy [name] is a 34 y.o. female.  Level 5 caveat for acuity of condition.  Patient found sitting on chair outside of an apartment complex not responding to direct questions.  Police and EMS were notified.  Patient was transferred to the emergency department.  Opiate overdose suspected.  No vaginal bleeding or discharge.        No past medical history on file.  There are no problems to display for this patient.     OB History   No obstetric history on file.     No family history on file.  Social History   Tobacco Use  . Smoking status: Not on file  Substance Use Topics  . Alcohol use: Not on file  . Drug use: Not on file    Home Medications Prior to Admission medications   Not on File    Allergies    Patient has no known allergies.  Review of Systems   Review of Systems  Unable to perform ROS: Acuity of condition    Physical Exam Updated Vital Signs Ht 5\' 7"  (1.702 m)   Wt 61.7 kg   BMI 21.30 kg/m   Physical Exam Vitals and nursing note reviewed.  Constitutional:      Appearance: She is well-developed.     Comments: Does not answer direct questions; obtunded  HENT:     Head: Normocephalic and atraumatic.  Eyes:     Conjunctiva/sclera: Conjunctivae normal.  Cardiovascular:     Rate and Rhythm: Normal rate and regular rhythm.  Pulmonary:     Effort: Pulmonary effort is normal.     Breath sounds: Normal breath sounds.  Abdominal:     General: Bowel sounds are normal.     Palpations: Abdomen is soft.  Musculoskeletal:     Cervical back: Neck supple.     Comments: Unable  Skin:    General: Skin is warm and dry.  Neurological:     Comments: Unable  Psychiatric:     Comments: Unable     ED Results / Procedures / Treatments   Labs (all labs ordered are  listed, but only abnormal results are displayed) Labs Reviewed  CBC WITH DIFFERENTIAL/PLATELET  COMPREHENSIVE METABOLIC PANEL  ETHANOL  RAPID URINE DRUG SCREEN, HOSP PERFORMED  ACETAMINOPHEN LEVEL  SALICYLATE LEVEL    EKG None  Radiology No results found.  Procedures Procedures (including critical care time)  Medications Ordered in ED Medications  naloxone (NARCAN) injection 1 mg (has no administration in time range)  sodium chloride 0.9 % bolus 1,000 mL (has no administration in time range)    ED Course  I have reviewed the triage vital signs and the nursing notes.  Pertinent labs & imaging results that were available during my care of the patient were reviewed by me and considered in my medical decision making (see chart for details).    MDM Rules/Calculators/A&P                      Suspect opiate overdose.  Narcan 1 mg IV administered.  Patient became more responsive.  I called her mother Kathy [name] at [phone].  She stated this is an ongoing problem with opiate addiction.  Additionally she is 3 months pregnant (G4, P3), has a seizure disorder, and asthma.  Her children live with her brother.  1200: Patient checked several times.  She is crying and alert.  No vaginal bleeding.  I discussed the clinical situation with her mother and father.  Additionally, I called [facility] at [phone] for addiction issues.  According to the staff, she has not followed up with her program appropriately.  Local Police Department has a warrant for her arrest.  She will discharged to the care of the police.  CRITICAL CARE Performed by: Donnetta Hutching Total critical care time: 45 minutes Critical care time was exclusive of separately billable procedures and treating other patients. Critical care was necessary to treat or prevent imminent or life-threatening deterioration. Critical care was time spent personally by me on the following activities: development of treatment plan with patient  and/or surrogate as well as nursing, discussions with consultants, evaluation of patient's response to treatment, examination of patient, obtaining history from patient or surrogate, ordering and performing treatments and interventions, ordering and review of laboratory studies, ordering and review of radiographic studies, pulse oximetry and re-evaluation of patient's condition. Final Clinical Impression(s) / ED Diagnoses Final diagnoses:  Drug overdose, undetermined intent, initial encounter    Rx / DC Orders ED Discharge Orders    None       Donnetta Hutching, MD 04/19/19 0981    Donnetta Hutching, MD 04/19/19 [phone]

## 2019-04-19 NOTE — ED Triage Notes (Signed)
Pt brought in by [facility]. Pt was found sitting in a lawn chair outside next to an apartment complex. Pt was non-responsive but maintaining airway, resp 11 breaths per minute, ST on monitor, BP 94/56 for EMS. Pt was not given Narcan by EMS because respirations were above 6 so she didn't meet protocol. Pt has no ID on her and EMS and police are not familiar with pt.

## 2019-04-19 NOTE — Discharge Instructions (Addendum)
Patient will be discharged under the care of the [facility] police department.  Return here for any medical concerns.

## 2019-04-19 NOTE — ED Notes (Signed)
Ok to hold NS and 2nd dose of Narcan per Dr Adriana Simas, for now.

## 2019-04-19 NOTE — ED Notes (Signed)
Dr Adriana Simas in speaking with mother and father of pt.  Pt is very tearful and attempting to over talk family and MD.

## 2019-04-19 NOTE — ED Notes (Signed)
Attempted to reach mother Kathy [name] at [phone], left message to return call.

## 2019-04-19 NOTE — ED Notes (Addendum)
MD spoke with pt's mother and she reports that pt is currently 3 months pregnant, G4, P3. Pt has hx of seizures and asthma. Pt's 3 children live with her brother and not the pt. Mother is on her way here from [facility]. Daphyne, primary RN to be notified.

## 2019-04-19 NOTE — ED Notes (Signed)
Pt has spoken to [facility].  Plan is to attempt placement [facility].  Pt has been randomly talking , word salad at times.  Pt admits that she is 3 months pregnant.  Pt is blaming and yelling at times.

## 2019-05-03 ENCOUNTER — Other Ambulatory Visit: Payer: Self-pay

## 2019-05-03 ENCOUNTER — Emergency Department (HOSPITAL_COMMUNITY)
Admission: EM | Admit: 2019-05-03 | Discharge: 2019-05-03 | Disposition: A | Discharge status: Home / Self Care | Payer: Medicaid Other | Attending: Emergency Medicine | Admitting: Emergency Medicine

## 2019-05-03 ENCOUNTER — Encounter (HOSPITAL_COMMUNITY): Payer: Self-pay

## 2019-05-03 DIAGNOSIS — Z3A19 19 weeks gestation of pregnancy: Secondary | ICD-10-CM | POA: Insufficient documentation

## 2019-05-03 DIAGNOSIS — O99332 Smoking (tobacco) complicating pregnancy, second trimester: Secondary | ICD-10-CM | POA: Insufficient documentation

## 2019-05-03 DIAGNOSIS — J45909 Unspecified asthma, uncomplicated: Secondary | ICD-10-CM | POA: Insufficient documentation

## 2019-05-03 DIAGNOSIS — O99512 Diseases of the respiratory system complicating pregnancy, second trimester: Secondary | ICD-10-CM | POA: Insufficient documentation

## 2019-05-03 DIAGNOSIS — O99322 Drug use complicating pregnancy, second trimester: Secondary | ICD-10-CM | POA: Insufficient documentation

## 2019-05-03 DIAGNOSIS — F1910 Other psychoactive substance abuse, uncomplicated: Secondary | ICD-10-CM | POA: Insufficient documentation

## 2019-05-03 DIAGNOSIS — Z79899 Other long term (current) drug therapy: Secondary | ICD-10-CM | POA: Insufficient documentation

## 2019-05-03 DIAGNOSIS — F17200 Nicotine dependence, unspecified, uncomplicated: Secondary | ICD-10-CM | POA: Insufficient documentation

## 2019-05-03 LAB — CBC
HCT: 31.8 % — ABNORMAL LOW (ref 36.0–46.0)
Hemoglobin: 10.3 g/dL — ABNORMAL LOW (ref 12.0–15.0)
MCH: 26.8 pg (ref 26.0–34.0)
MCHC: 32.4 g/dL (ref 30.0–36.0)
MCV: 82.6 fL (ref 80.0–100.0)
Platelets: 239 10*3/uL (ref 150–400)
RBC: 3.85 MIL/uL — ABNORMAL LOW (ref 3.87–5.11)
RDW: 14.5 % (ref 11.5–15.5)
WBC: 6.3 10*3/uL (ref 4.0–10.5)
nRBC: 0 % (ref 0.0–0.2)

## 2019-05-03 LAB — RAPID URINE DRUG SCREEN, HOSP PERFORMED
Amphetamines: POSITIVE — AB
Barbiturates: NOT DETECTED
Benzodiazepines: POSITIVE — AB
Cocaine: NOT DETECTED
Opiates: NOT DETECTED
Tetrahydrocannabinol: NOT DETECTED

## 2019-05-03 LAB — COMPREHENSIVE METABOLIC PANEL
ALT: 59 U/L — ABNORMAL HIGH (ref 0–44)
AST: 58 U/L — ABNORMAL HIGH (ref 15–41)
Albumin: 3 g/dL — ABNORMAL LOW (ref 3.5–5.0)
Alkaline Phosphatase: 54 U/L (ref 38–126)
Anion gap: 10 (ref 5–15)
BUN: 12 mg/dL (ref 6–20)
CO2: 22 mmol/L (ref 22–32)
Calcium: 8.7 mg/dL — ABNORMAL LOW (ref 8.9–10.3)
Chloride: 104 mmol/L (ref 98–111)
Creatinine, Ser: 0.6 mg/dL (ref 0.44–1.00)
GFR calc Af Amer: >60 mL/min (ref >60)
GFR calc non Af Amer: >60 mL/min (ref >60)
Glucose, Bld: 99 mg/dL (ref 70–99)
Potassium: 3.8 mmol/L (ref 3.5–5.1)
Sodium: 136 mmol/L (ref 135–145)
Total Bilirubin: 0.5 mg/dL (ref 0.3–1.2)
Total Protein: 6.7 g/dL (ref 6.5–8.1)

## 2019-05-03 LAB — ETHANOL: Alcohol, Ethyl (B): <10 mg/dL (ref <10)

## 2019-05-03 LAB — SALICYLATE LEVEL: Salicylate Lvl: <7 mg/dL — ABNORMAL LOW (ref 7.0–30.0)

## 2019-05-03 LAB — ACETAMINOPHEN LEVEL: Acetaminophen (Tylenol), Serum: <10 ug/mL — ABNORMAL LOW (ref 10–30)

## 2019-05-03 NOTE — ED Notes (Signed)
[phone] pts father Tenae Graziosi, would like a call back regarding daughter. States he is trying to get her into a rehab center

## 2019-05-03 NOTE — ED Notes (Signed)
Nadir (Father#[phone]) called to relay information to nurse concerning is daughter. He stated, patient is schedule to go to [facility] (contact person there is Katrina [phone] Nadir.

## 2019-05-03 NOTE — ED Notes (Signed)
Pt was found in bathroom for approx an hour applying makeup and fixing her hair. Stated she was discharged and shown to exit. Pt refused discharge vitals and esignature. Escorted to exit

## 2019-05-03 NOTE — ED Provider Notes (Signed)
[facility] EMERGENCY DEPARTMENT Provider Note   CSN: [id number] Arrival date & time: 05/03/19  1357     History Chief Complaint  Patient presents with  . Drug Problem    Kathy [name] is a 34 y.o. female.  Patient is a 34 year old female with a history of seizures after benzo withdrawal, PTSD, bipolar disease who is currently [redacted] weeks pregnant who is presenting today with police after being found altered on the street.  Patient has a known history of ongoing opiate and benzodiazepine abuse and father called a behavioral health facility in [place] and they recommended she come for detox.  Patient reports she has no thoughts of wanting to hurt herself or hurt anyone else.  She denies feeling depressed.  She reports she knows she should get off drugs because it would be the best thing for her baby but she just recently use prior to arrival.  She denies any localized pain, trauma.  She does not know if she is feeling baby move.  Ultrasound done 4 weeks ago showed 15-week fetus.  Speaking with Katrina at the [facility] facility she reports they provide services for people who have already gone through detox.  But at this time they do not have any specific program to offer her until she has detox from the medications and drugs she has been taking.  The history is provided by the patient.       Past Medical History:  Diagnosis Date  . Anxiety   . Asthma   . Bipolar disorder (HCC)   . Compression fracture of spine (HCC)   . Fibromyalgia   . Headache   . PTSD (post-traumatic stress disorder)   . Seizures (HCC)    Related to benzo withdrawal  . Seizures [facility])     Patient Active Problem List   Diagnosis Date Noted  . Opiate overdose (HCC)   . Polysubstance (excluding opioids) dependence, daily use (HCC) 11/30/2016  . Polysubstance dependence including opioid type drug, continuous use (HCC) 11/30/2016  . History of 3 spontaneous abortions 03/24/2016  . Eczema 03/24/2016  .  Depression 03/24/2016  . Asthma without status asthmaticus without complication 03/24/2016  . Anxiety 03/24/2016  . Opiate abuse, continuous (HCC) 03/24/2016  . Fibromyalgia 04/17/2015  . Chronic hepatitis C virus infection (HCC) 02/28/2015  . Chronic migraine 07/23/2014  . Bipolar disorder (HCC) 05/10/2013  . PTSD (post-traumatic stress disorder) 05/10/2013  . Polysubstance abuse (HCC) 10/03/2012    Past Surgical History:  Procedure Laterality Date  . HAND SURGERY Right   . HAND SURGERY       OB History    Gravida  4   Para  3   Term  0   Preterm  0   AB  0   Living        SAB  0   TAB  0   Ectopic  0   Multiple      Live Births           Obstetric Comments  Pt's 3 children live with her brother per mother. Pt is currently 3 months pregnant as of 04/19/19.        No family history on file.  Social History   Tobacco Use  . Smoking status: Current Every Day Smoker  . Smokeless tobacco: Never Used  . Tobacco comment: Pt does not want to stop smoking at this time  Substance Use Topics  . Alcohol use: Not Currently    Comment: unknown  .  Drug use: Not Currently    Types: Cocaine, Benzodiazepines, Heroin, Hydrocodone    Comment: Cocaine: last used; Heroin: last used 09/17/2016;     Home Medications Prior to Admission medications   Medication Sig Start Date End Date Taking? Authorizing Provider  albuterol (VENTOLIN HFA) 108 (90 Base) MCG/ACT inhaler Inhale 2 puffs into the lungs every 6 (six) hours as needed for wheezing or shortness of breath. 03/28/19   Nance Pear, MD  ALPRAZolam Duanne Moron) 1 MG tablet Take 2 mg by mouth 4 (four) times daily. 09/26/18   [provider]  benztropine (COGENTIN) 0.5 MG tablet Take 1 tablet (0.5 mg total) by mouth 2 (two) times daily. For prevention 04/04/17   Lindell Spar I, NP  diazepam (VALIUM) 10 MG tablet Take 20 mg by mouth 2 (two) times daily. 09/26/18   [provider]  divalproex (DEPAKOTE) 250  MG DR tablet Take 250 mg by mouth 3 (three) times daily. 08/16/18   [provider]  gabapentin (NEURONTIN) 300 MG capsule Take 2 capsules (600 mg total) by mouth 3 (three) times daily at 8am, 3pm and bedtime. For agitation/substance withdrawal syndrome 04/04/17   Lindell Spar I, NP  hydrOXYzine (ATARAX/VISTARIL) 50 MG tablet Take 1 tablet (50 mg) by mouth four times daily as needed: For anxiety 04/04/17   Lindell Spar I, NP  naloxone [facility]) nasal spray 4 mg/0.1 mL Use for overdose of heroin/opioids. 12/25/18   Vanessa [place], MD  nicotine polacrilex (NICORETTE) 2 MG gum Take 1 each (2 mg total) by mouth as needed for smoking cessation. (May purchase from over the counter): For smoking cessation 04/04/17   Lindell Spar I, NP  prazosin (MINIPRESS) 1 MG capsule Take 3 capsules (3 mg total) by mouth at bedtime. 04/04/17   Niel Hummer, NP  propranolol (INDERAL) 20 MG tablet Take 1 tablet (20 mg total) by mouth 2 (two) times daily. 04/05/17   Niel Hummer, NP  QUEtiapine (SEROQUEL) 300 MG tablet Take 1 tablet (300 mg total) by mouth at bedtime. For mood control 04/04/17   Lindell Spar I, NP  QUEtiapine (SEROQUEL) 50 MG tablet Take 1 tablet (50 mg total) by mouth 3 (three) times daily. For agitation/mood control 04/04/17   Encarnacion Slates, NP    Allergies    Patient has no known allergies.  Review of Systems   Review of Systems  All other systems reviewed and are negative.   Physical Exam Updated Vital Signs BP 119/76   Pulse 97   Temp 98.7 F (37.1 C) (Oral)   Resp 18   LMP  (LMP Unknown) Comment: mother reports pt is currently 3 months pregnant as of 04/19/19  SpO2 100%   Physical Exam Vitals and nursing note reviewed.  Constitutional:      General: She is not in acute distress.    Appearance: She is well-developed and normal weight.     Comments: Patient is awake and talking but intermittently falling asleep during exam mumbling and hard to follow  HENT:     Head: Normocephalic and  atraumatic.     Nose: Nose normal.     Mouth/Throat:     Mouth: Mucous membranes are moist.  Eyes:     Pupils: Pupils are equal, round, and reactive to light.  Cardiovascular:     Rate and Rhythm: Normal rate and regular rhythm.     Heart sounds: Normal heart sounds. No murmur. No friction rub.  Pulmonary:     Effort: Pulmonary  effort is normal.     Breath sounds: Normal breath sounds. No wheezing or rales.  Abdominal:     General: Bowel sounds are normal. There is no distension.     Palpations: Abdomen is soft.     Tenderness: There is no abdominal tenderness. There is no guarding or rebound.     Comments: Gravid abdomen to slightly above the umbilicus.  No abdominal pain noted  Musculoskeletal:        General: No tenderness. Normal range of motion.     Right lower leg: No edema.     Left lower leg: No edema.     Comments: No edema  Skin:    General: Skin is warm and dry.     Findings: No rash.  Neurological:     Mental Status: She is oriented to person, place, and time.     Cranial Nerves: No cranial nerve deficit.  Psychiatric:     Comments: Patient is currently under the influence of drugs and is drowsy but able to carry on a conversation although she does fall asleep midsentence at times.  She denies feeling depressed at this time.  She denies homicidal or suicidal ideation.  Patient reports that she would like detox because it would be the best thing for her baby     ED Results / Procedures / Treatments   Labs (all labs ordered are listed, but only abnormal results are displayed) Labs Reviewed  COMPREHENSIVE METABOLIC PANEL - Abnormal; Notable for the following components:      Result Value   Calcium 8.7 (*)    Albumin 3.0 (*)    AST 58 (*)    ALT 59 (*)    All other components within normal limits  SALICYLATE LEVEL - Abnormal; Notable for the following components:   Salicylate Lvl <7.0 (*)    All other components within normal limits  ACETAMINOPHEN LEVEL -  Abnormal; Notable for the following components:   Acetaminophen (Tylenol), Serum <10 (*)    All other components within normal limits  CBC - Abnormal; Notable for the following components:   RBC 3.85 (*)    Hemoglobin 10.3 (*)    HCT 31.8 (*)    All other components within normal limits  ETHANOL  RAPID URINE DRUG SCREEN, HOSP PERFORMED    EKG None  Radiology No results found.  Procedures Procedures (including critical care time)  Medications Ordered in ED Medications - No data to display  ED Course  I have reviewed the triage vital signs and the nursing notes.  Pertinent labs & imaging results that were available during my care of the patient were reviewed by me and considered in my medical decision making (see chart for details).    MDM Rules/Calculators/A&P                      34 year old female currently [redacted] weeks pregnant presenting today for detox.  Patient was brought by police because she was under the influence and altered near an intersection on [address].  Patient does admit to using heroin and Xanax.  She has no complaints at this time.  Father sent her here because he thought we provided detox programs.  Attempted to call the father several times and he did not answer.  Spoke with Katrina at the behavioral health [facility] facility and they are only able to provide resources for patients after they have gone through detox.  Discussed with patient that we have several  detox options but she is responsible for calling those places and securing a bed.  Vital signs and labs are normal today.  Fetal heart tones will be assessed.  Once patient is more sober feel that she is stable for discharge.  6:56 PM Pt now more awake.  Discussed with her the process of rehab and detox.  Pt given resources.  Fetal heart tones are normal between 140 and 150. Final Clinical Impression(s) / ED Diagnoses Final diagnoses:  Polysubstance abuse [facility])    Rx / DC Orders ED Discharge  Orders    None       Gwyneth Sprout, MD 05/03/19 Windell Moment

## 2019-05-03 NOTE — Discharge Instructions (Addendum)
[facility] and behavioral health do not offer inpatient detox programs but there are multiple places that do which are all present in your resource guide.  Once you complete 1 to 2 weeks of detox in these programs you then can move to a residential program.  It is your responsibility to call these places so that you can get a spot.  The hospital is not able to do this for you.  It appears that you are currently [redacted] weeks pregnant and everything with the baby looks normal today.

## 2019-05-03 NOTE — ED Notes (Signed)
Patient refused to change into a hospital gown and be connected to our monitor.

## 2019-05-03 NOTE — ED Triage Notes (Signed)
Pt requesting detox from opiates. Last use at 10am. [redacted] weeks pregnant, no other complaints.

## 2019-05-03 NOTE — ED Notes (Signed)
Pt oriented but continuously falling asleep while speaking with her. Fetal heart tones located at 152 consistently. Pt eating in room without any complaints

## 2019-07-29 ENCOUNTER — Emergency Department
Admission: EM | Admit: 2019-07-29 | Discharge: 2019-07-30 | Disposition: A | Discharge status: Home / Self Care | Payer: Medicaid Other | Attending: Emergency Medicine | Admitting: Emergency Medicine

## 2019-07-29 ENCOUNTER — Encounter: Payer: Self-pay | Admitting: Emergency Medicine

## 2019-07-29 DIAGNOSIS — O26833 Pregnancy related renal disease, third trimester: Secondary | ICD-10-CM | POA: Insufficient documentation

## 2019-07-29 DIAGNOSIS — B9689 Other specified bacterial agents as the cause of diseases classified elsewhere: Secondary | ICD-10-CM | POA: Diagnosis not present

## 2019-07-29 DIAGNOSIS — R300 Dysuria: Secondary | ICD-10-CM | POA: Diagnosis not present

## 2019-07-29 DIAGNOSIS — N3000 Acute cystitis without hematuria: Secondary | ICD-10-CM

## 2019-07-29 DIAGNOSIS — Z3A00 Weeks of gestation of pregnancy not specified: Secondary | ICD-10-CM | POA: Insufficient documentation

## 2019-07-29 DIAGNOSIS — O99019 Anemia complicating pregnancy, unspecified trimester: Secondary | ICD-10-CM | POA: Diagnosis not present

## 2019-07-29 DIAGNOSIS — N760 Acute vaginitis: Secondary | ICD-10-CM

## 2019-07-29 DIAGNOSIS — D649 Anemia, unspecified: Secondary | ICD-10-CM

## 2019-07-29 DIAGNOSIS — O2391 Unspecified genitourinary tract infection in pregnancy, first trimester: Secondary | ICD-10-CM | POA: Diagnosis present

## 2019-07-29 LAB — CBC
HCT: 26.9 % — ABNORMAL LOW (ref 36.0–46.0)
Hemoglobin: 8.6 g/dL — ABNORMAL LOW (ref 12.0–15.0)
MCH: 24.7 pg — ABNORMAL LOW (ref 26.0–34.0)
MCHC: 32 g/dL (ref 30.0–36.0)
MCV: 77.3 fL — ABNORMAL LOW (ref 80.0–100.0)
Platelets: 290 10*3/uL (ref 150–400)
RBC: 3.48 MIL/uL — ABNORMAL LOW (ref 3.87–5.11)
RDW: 14.4 % (ref 11.5–15.5)
WBC: 10 10*3/uL (ref 4.0–10.5)
nRBC: 0 % (ref 0.0–0.2)

## 2019-07-29 LAB — WET PREP, GENITAL
Sperm: NONE SEEN
Trich, Wet Prep: NONE SEEN
Yeast Wet Prep HPF POC: NONE SEEN

## 2019-07-29 LAB — COMPREHENSIVE METABOLIC PANEL
ALT: 19 U/L (ref 0–44)
AST: 24 U/L (ref 15–41)
Albumin: 2.6 g/dL — ABNORMAL LOW (ref 3.5–5.0)
Alkaline Phosphatase: 163 U/L — ABNORMAL HIGH (ref 38–126)
Anion gap: 7 (ref 5–15)
BUN: 14 mg/dL (ref 6–20)
CO2: 24 mmol/L (ref 22–32)
Calcium: 8 mg/dL — ABNORMAL LOW (ref 8.9–10.3)
Chloride: 103 mmol/L (ref 98–111)
Creatinine, Ser: 0.58 mg/dL (ref 0.44–1.00)
GFR calc Af Amer: >60 mL/min (ref >60)
GFR calc non Af Amer: >60 mL/min (ref >60)
Glucose, Bld: 107 mg/dL — ABNORMAL HIGH (ref 70–99)
Potassium: 3.6 mmol/L (ref 3.5–5.1)
Sodium: 134 mmol/L — ABNORMAL LOW (ref 135–145)
Total Bilirubin: 0.4 mg/dL (ref 0.3–1.2)
Total Protein: 6.5 g/dL (ref 6.5–8.1)

## 2019-07-29 LAB — URINALYSIS, COMPLETE (UACMP) WITH MICROSCOPIC
Bilirubin Urine: NEGATIVE
Glucose, UA: NEGATIVE mg/dL
Ketones, ur: NEGATIVE mg/dL
Nitrite: NEGATIVE
Protein, ur: 30 mg/dL — AB
RBC / HPF: >50 RBC/hpf — ABNORMAL HIGH (ref 0–5)
Specific Gravity, Urine: 1.025 (ref 1.005–1.030)
WBC, UA: >50 WBC/hpf — ABNORMAL HIGH (ref 0–5)
pH: 6 (ref 5.0–8.0)

## 2019-07-29 LAB — CHLAMYDIA/NGC RT PCR (ARMC ONLY)
Chlamydia Tr: NOT DETECTED
N gonorrhoeae: NOT DETECTED

## 2019-07-29 MED ORDER — SODIUM CHLORIDE 0.9 % IV SOLN
1.0000 g | Freq: Once | INTRAVENOUS | Status: AC
  Administered 2019-07-29: 1 g via INTRAVENOUS
  Filled 2019-07-29: qty 10

## 2019-07-29 NOTE — ED Triage Notes (Signed)
Initial triage note created by this RN.

## 2019-07-29 NOTE — ED Triage Notes (Signed)
Pt to ED with c/o of blood in urine. Pt is currently 7 mos pregnant and denies any pain, discharge or fluid loss. Pt states feels fetal movement.  L&D MD would like medical clearance in ED.

## 2019-07-29 NOTE — ED Notes (Signed)
Pt arrives to room by wheelchair.  She states there is blood in her urine and heavy yellow/green vaginal discharge.  She states her vagina is swollen and irritated, urinating feels like "razor blades".  Pt states she took Keflex for a UTI approx one month ago. Pain is 8/10 (vaginal and pubic regions).  Pt denies cramps other than occasional Kathy [name].

## 2019-07-29 NOTE — ED Provider Notes (Signed)
[facility] Emergency Department Provider Note  ____________________________________________  Time seen: Approximately 6:33 PM  I have reviewed the triage vital signs and the nursing notes.   HISTORY  Chief Complaint Hematuria    HPI Kathy [name] is a 34 y.o. female who presents the emergency department complaining of diffuse abdominal pain, dysuria, vaginal discharge.  Patient is approximately 7 months pregnant.  She states that she was late to OB/GYN care as she did not realize she was pregnant until 4 months.  Patient states that she is having increased "tightness" as well as diffuse abdominal pain progressively throughout her pregnancy.  There is no acute changes over the past several days.  Patient states that she had a urinary tract infection a month ago, was treated with Keflex and improved.  Patient has a return of dysuria at this time.  She endorses some hematuria with this.  No flank pain.  No history of kidney stones.  Patient is also complaining of abnormal vaginal discharge.  No vaginal bleeding.  Patient states that she has a history of BV as well as STDs.  Patient would like to be checked for both.  No fevers or chills.  Eating and drinking appropriately.         Past Medical History:  Diagnosis Date  . Anxiety   . Asthma   . Bipolar disorder ([place])   . Compression fracture of spine ([place])   . Fibromyalgia   . Headache   . PTSD (post-traumatic stress disorder)   . Seizures ([place])    Related to benzo withdrawal  . Seizures [facility])     Patient Active Problem List   Diagnosis Date Noted  . Opiate overdose ([place])   . Polysubstance (excluding opioids) dependence, daily use ([place]) 11/30/2016  . Polysubstance dependence including opioid type drug, continuous use ([place]) 11/30/2016  . History of 3 spontaneous abortions 03/24/2016  . Eczema 03/24/2016  . Depression 03/24/2016  . Asthma without status asthmaticus without complication 78/67/6720  .  Anxiety 03/24/2016  . Opiate abuse, continuous ([place]) 03/24/2016  . Fibromyalgia 04/17/2015  . Chronic hepatitis C virus infection ([place]) 02/28/2015  . Chronic migraine 07/23/2014  . Bipolar disorder ([place]) 05/10/2013  . PTSD (post-traumatic stress disorder) 05/10/2013  . Polysubstance abuse ([place]) 10/03/2012    Past Surgical History:  Procedure Laterality Date  . HAND SURGERY Right   . HAND SURGERY      Prior to Admission medications   Medication Sig Start Date End Date Taking? Authorizing Provider  albuterol (VENTOLIN HFA) 108 (90 Base) MCG/ACT inhaler Inhale 2 puffs into the lungs every 6 (six) hours as needed for wheezing or shortness of breath. 03/28/19   Nance Pear, MD  ALPRAZolam Duanne Moron) 1 MG tablet Take 2 mg by mouth 4 (four) times daily. 09/26/18   [provider]  benztropine (COGENTIN) 0.5 MG tablet Take 1 tablet (0.5 mg total) by mouth 2 (two) times daily. For prevention 04/04/17   Lindell Spar I, NP  diazepam (VALIUM) 10 MG tablet Take 20 mg by mouth 2 (two) times daily. 09/26/18   [provider]  divalproex (DEPAKOTE) 250 MG DR tablet Take 250 mg by mouth 3 (three) times daily. 08/16/18   [provider]  gabapentin (NEURONTIN) 300 MG capsule Take 2 capsules (600 mg total) by mouth 3 (three) times daily at 8am, 3pm and bedtime. For agitation/substance withdrawal syndrome 04/04/17   Lindell Spar I, NP  hydrOXYzine (ATARAX/VISTARIL) 50 MG tablet Take 1 tablet (50 mg) by  mouth four times daily as needed: For anxiety 04/04/17   Armandina Stammer I, NP  naloxone [facility]) nasal spray 4 mg/0.1 mL Use for overdose of heroin/opioids. 12/25/18   Concha Se, MD  nicotine polacrilex (NICORETTE) 2 MG gum Take 1 each (2 mg total) by mouth as needed for smoking cessation. (May purchase from over the counter): For smoking cessation 04/04/17   Armandina Stammer I, NP  prazosin (MINIPRESS) 1 MG capsule Take 3 capsules (3 mg total) by mouth at bedtime. 04/04/17   Thermon Leyland, NP   propranolol (INDERAL) 20 MG tablet Take 1 tablet (20 mg total) by mouth 2 (two) times daily. 04/05/17   Thermon Leyland, NP  QUEtiapine (SEROQUEL) 300 MG tablet Take 1 tablet (300 mg total) by mouth at bedtime. For mood control 04/04/17   Armandina Stammer I, NP  QUEtiapine (SEROQUEL) 50 MG tablet Take 1 tablet (50 mg total) by mouth 3 (three) times daily. For agitation/mood control 04/04/17   Sanjuana Kava, NP    Allergies Patient has no known allergies.  No family history on file.  Social History Social History   Tobacco Use  . Smoking status: Current Every Day Smoker  . Smokeless tobacco: Never Used  . Tobacco comment: Pt does not want to stop smoking at this time  Vaping Use  . Vaping Use: Never used  Substance Use Topics  . Alcohol use: Not Currently    Comment: unknown  . Drug use: Not Currently    Types: Cocaine, Benzodiazepines, Heroin, Hydrocodone    Comment: Cocaine: last used; Heroin: last used 09/17/2016;      Review of Systems  Constitutional: No fever/chills Eyes: No visual changes. No discharge ENT: No upper respiratory complaints. Cardiovascular: no chest pain. Respiratory: no cough. No SOB. Gastrointestinal: No abdominal pain.  No nausea, no vomiting.  No diarrhea.  No constipation. Genitourinary: Positive for dysuria and hematuria.  Patient also endorses vaginal discharge but no vaginal bleeding. Musculoskeletal: Negative for musculoskeletal pain. Skin: Negative for rash, abrasions, lacerations, ecchymosis. Neurological: Negative for headaches, focal weakness or numbness. 10-point ROS otherwise negative.  ____________________________________________   PHYSICAL EXAM:  VITAL SIGNS: ED Triage Vitals  Enc Vitals Group     BP 07/29/19 1727 132/79     Pulse Rate 07/29/19 1727 (!) 107     Resp 07/29/19 1727 18     Temp 07/29/19 1727 98.3 F (36.8 C)     Temp Source 07/29/19 1727 Oral     SpO2 07/29/19 1727 98 %     Weight 07/29/19 1729 145 lb (65.8 kg)      Height 07/29/19 1729 5\' 3"  (1.6 m)     Head Circumference --      Peak Flow --      Pain Score 07/29/19 1729 0     Pain Loc --      Pain Edu? --      Excl. in GC? --      Constitutional: Alert and oriented. Well appearing and in no acute distress. Eyes: Conjunctivae are normal. PERRL. EOMI. Head: Atraumatic. ENT:      Ears:       Nose: No congestion/rhinnorhea.      Mouth/Throat: Mucous membranes are moist.  Neck: No stridor.    Cardiovascular: Normal rate, regular rhythm. Normal S1 and S2.  Good peripheral circulation. Respiratory: Normal respiratory effort without tachypnea or retractions. Lungs CTAB. Good air entry to the bases with no decreased or absent breath sounds. Gastrointestinal: Obviously  gravid abdomen.  Bowel sounds 4 quadrants.  Soft to palpation all quadrants.  Patient is tender to palpation in the suprapubic region.  No other significant tenderness to palpation.  No guarding or rigidity. . No distention. No CVA tenderness. Genitourinary: No external lesions or chancres identified.  Exam with speculum reveals significant amount of whitish-gray discharge in the vaginal vault.  Thick chunky white discharge noted along the vaginal walls.  No vaginal tears or lacerations.  Cervix with no gross erythema or edema or other lesions identified.  Bimanual exam reveals no cervical motion tenderness.  No blood identified. Musculoskeletal: Full range of motion to all extremities. No gross deformities appreciated. Neurologic:  Normal speech and language. No gross focal neurologic deficits are appreciated.  Skin:  Skin is warm, dry and intact. No rash noted. Psychiatric: Mood and affect are normal. Speech and behavior are normal. Patient exhibits appropriate insight and judgement.   ____________________________________________   LABS (all labs ordered are listed, but only abnormal results are displayed)  Labs Reviewed  WET PREP, GENITAL - Abnormal; Notable for the following  components:      Result Value   Clue Cells Wet Prep HPF POC PRESENT (*)    WBC, Wet Prep HPF POC FEW (*)    All other components within normal limits  CBC - Abnormal; Notable for the following components:   RBC 3.48 (*)    Hemoglobin 8.6 (*)    HCT 26.9 (*)    MCV 77.3 (*)    MCH 24.7 (*)    All other components within normal limits  COMPREHENSIVE METABOLIC PANEL - Abnormal; Notable for the following components:   Sodium 134 (*)    Glucose, Bld 107 (*)    Calcium 8.0 (*)    Albumin 2.6 (*)    Alkaline Phosphatase 163 (*)    All other components within normal limits  URINALYSIS, COMPLETE (UACMP) WITH MICROSCOPIC - Abnormal; Notable for the following components:   Color, Urine AMBER (*)    APPearance CLOUDY (*)    Hgb urine dipstick SMALL (*)    Protein, ur 30 (*)    Leukocytes,Ua LARGE (*)    RBC / HPF >50 (*)    WBC, UA >50 (*)    Bacteria, UA FEW (*)    All other components within normal limits  CHLAMYDIA/NGC RT PCR ([facility] ONLY)  URINE CULTURE   ____________________________________________  EKG   ____________________________________________  RADIOLOGY   No results found.  ____________________________________________    PROCEDURES  Procedure(s) performed:    Procedures    Medications  cefTRIAXone (ROCEPHIN) 1 g in sodium chloride 0.9 % 100 mL IVPB (has no administration in time range)     ____________________________________________   INITIAL IMPRESSION / ASSESSMENT AND PLAN / ED COURSE  Pertinent labs & imaging results that were available during my care of the patient were reviewed by me and considered in my medical decision making (see chart for details).  Review of the [place] CSRS was performed in accordance of the [facility] prior to dispensing any controlled drugs.           Patient care will be transferred to attending provider, Dr. Fuller Plan awaiting gonorrhea and chlamydia testing.  Patient presented complaining of vaginal discharge,  dysuria, hematuria, suprapubic pain.  Patient was afebrile, no nausea or vomiting.  Patient is 7 months pregnant.  She has been late to prenatal care.  Patient states that she was roughly 4 months pregnant before she realized she was pregnant.  Patient had been treated with Keflex for UTI 1 month ago from [facility].  Patient states that this cleared symptoms but then symptoms returned.  She is complaining of dysuria, hematuria.  No flank pain.  Exam was concerning for significant vaginal discharge.  No cervical motion tenderness.  Findings are concerning for UTI on urinalysis.  I have a low suspicion for pyelonephritis or nephrolithiasis at this time.  Given the amount of discharge, suprapubic pain, I feel that patient needed to await gonorrhea chlamydia testing versus empiric treatment.  Pending these results I have discussed the patient's history, physical exam, work-up to this point with attending provider, Dr. Fuller Plan.  Final diagnosis and disposition will be provided by attending provider.    This chart was dictated using voice recognition software/Dragon. Despite best efforts to proofread, errors can occur which can change the meaning. Any change was purely unintentional.    Racheal Patches, PA-C 07/29/19 2123    Concha Se, MD 07/30/19 [phone]

## 2019-07-30 MED ORDER — METRONIDAZOLE 250 MG PO TABS: 250.0000 mg | ORAL_TABLET | Freq: Three times a day (TID) | ORAL | 0 refills | Status: AC

## 2019-07-30 MED ORDER — FERROUS SULFATE 325 (65 FE) MG PO TBEC: 325.0000 mg | DELAYED_RELEASE_TABLET | Freq: Two times a day (BID) | ORAL | 0 refills | Status: DC

## 2019-07-30 MED ORDER — CEFUROXIME AXETIL 250 MG PO TABS: 250.0000 mg | ORAL_TABLET | Freq: Two times a day (BID) | ORAL | 0 refills | Status: AC

## 2019-07-30 NOTE — Discharge Instructions (Addendum)
Covering you for UTI and bacterial vaginosis.  You need to follow-up with the health department to get an OB/GYN.  Your blood levels are slightly low at 8.5 we started you on some iron.  It be important you have your blood levels rechecked in a few days and return to the ER more sooner if you develop lightheadedness, shortness of breath failure to pass out or any other concerns.

## 2019-07-30 NOTE — ED Notes (Signed)
FHR 138 by doppler

## 2019-07-31 ENCOUNTER — Emergency Department
Admission: EM | Admit: 2019-07-31 | Discharge: 2019-07-31 | Disposition: A | Discharge status: Home / Self Care | Payer: Medicaid Other | Attending: Emergency Medicine | Admitting: Emergency Medicine

## 2019-07-31 ENCOUNTER — Other Ambulatory Visit: Payer: Self-pay

## 2019-07-31 DIAGNOSIS — O26893 Other specified pregnancy related conditions, third trimester: Secondary | ICD-10-CM | POA: Insufficient documentation

## 2019-07-31 DIAGNOSIS — F19229 Other psychoactive substance dependence with intoxication, unspecified: Secondary | ICD-10-CM | POA: Insufficient documentation

## 2019-07-31 DIAGNOSIS — Z3A00 Weeks of gestation of pregnancy not specified: Secondary | ICD-10-CM | POA: Diagnosis not present

## 2019-07-31 DIAGNOSIS — F19929 Other psychoactive substance use, unspecified with intoxication, unspecified: Secondary | ICD-10-CM

## 2019-07-31 DIAGNOSIS — F3161 Bipolar disorder, current episode mixed, mild: Secondary | ICD-10-CM | POA: Diagnosis not present

## 2019-07-31 DIAGNOSIS — G9340 Encephalopathy, unspecified: Secondary | ICD-10-CM

## 2019-07-31 DIAGNOSIS — F17210 Nicotine dependence, cigarettes, uncomplicated: Secondary | ICD-10-CM | POA: Diagnosis not present

## 2019-07-31 DIAGNOSIS — F411 Generalized anxiety disorder: Secondary | ICD-10-CM | POA: Diagnosis not present

## 2019-07-31 DIAGNOSIS — R4182 Altered mental status, unspecified: Secondary | ICD-10-CM | POA: Diagnosis not present

## 2019-07-31 LAB — COMPREHENSIVE METABOLIC PANEL
ALT: 19 U/L (ref 0–44)
AST: 22 U/L (ref 15–41)
Albumin: 2.6 g/dL — ABNORMAL LOW (ref 3.5–5.0)
Alkaline Phosphatase: 158 U/L — ABNORMAL HIGH (ref 38–126)
Anion gap: 8 (ref 5–15)
BUN: 18 mg/dL (ref 6–20)
CO2: 23 mmol/L (ref 22–32)
Calcium: 8.2 mg/dL — ABNORMAL LOW (ref 8.9–10.3)
Chloride: 105 mmol/L (ref 98–111)
Creatinine, Ser: 0.63 mg/dL (ref 0.44–1.00)
GFR calc Af Amer: >60 mL/min (ref >60)
GFR calc non Af Amer: >60 mL/min (ref >60)
Glucose, Bld: 92 mg/dL (ref 70–99)
Potassium: 3.4 mmol/L — ABNORMAL LOW (ref 3.5–5.1)
Sodium: 136 mmol/L (ref 135–145)
Total Bilirubin: 0.4 mg/dL (ref 0.3–1.2)
Total Protein: 6.6 g/dL (ref 6.5–8.1)

## 2019-07-31 LAB — CBC WITH DIFFERENTIAL/PLATELET
Abs Immature Granulocytes: 0.07 10*3/uL (ref 0.00–0.07)
Basophils Absolute: 0.1 10*3/uL (ref 0.0–0.1)
Basophils Relative: 0 %
Eosinophils Absolute: 0.3 10*3/uL (ref 0.0–0.5)
Eosinophils Relative: 3 %
HCT: 27.1 % — ABNORMAL LOW (ref 36.0–46.0)
Hemoglobin: 8.7 g/dL — ABNORMAL LOW (ref 12.0–15.0)
Immature Granulocytes: 1 %
Lymphocytes Relative: 18 %
Lymphs Abs: 2.4 10*3/uL (ref 0.7–4.0)
MCH: 24.6 pg — ABNORMAL LOW (ref 26.0–34.0)
MCHC: 32.1 g/dL (ref 30.0–36.0)
MCV: 76.8 fL — ABNORMAL LOW (ref 80.0–100.0)
Monocytes Absolute: 0.9 10*3/uL (ref 0.1–1.0)
Monocytes Relative: 7 %
Neutro Abs: 9.6 10*3/uL — ABNORMAL HIGH (ref 1.7–7.7)
Neutrophils Relative %: 71 %
Platelets: 286 10*3/uL (ref 150–400)
RBC: 3.53 MIL/uL — ABNORMAL LOW (ref 3.87–5.11)
RDW: 14.4 % (ref 11.5–15.5)
WBC: 13.4 10*3/uL — ABNORMAL HIGH (ref 4.0–10.5)
nRBC: 0.1 % (ref 0.0–0.2)

## 2019-07-31 LAB — URINE CULTURE: Culture: >=100000 — AB

## 2019-07-31 LAB — HCG, QUANTITATIVE, PREGNANCY: hCG, Beta Chain, Quant, S: 53490 m[IU]/mL — ABNORMAL HIGH (ref <5)

## 2019-07-31 LAB — URINE DRUG SCREEN, QUALITATIVE (ARMC ONLY)
Amphetamines, Ur Screen: POSITIVE — AB
Barbiturates, Ur Screen: NOT DETECTED
Benzodiazepine, Ur Scrn: POSITIVE — AB
Cannabinoid 50 Ng, Ur ~~LOC~~: NOT DETECTED
Cocaine Metabolite,Ur ~~LOC~~: NOT DETECTED
MDMA (Ecstasy)Ur Screen: NOT DETECTED
Methadone Scn, Ur: NOT DETECTED
Opiate, Ur Screen: POSITIVE — AB
Phencyclidine (PCP) Ur S: NOT DETECTED
Tricyclic, Ur Screen: NOT DETECTED

## 2019-07-31 LAB — ETHANOL: Alcohol, Ethyl (B): <10 mg/dL (ref <10)

## 2019-07-31 NOTE — ED Notes (Signed)
1 black bra 1pair Black and white shoes Two gray socks 1- black/brown brief 1 black pant 1 brown/black wig  1black bag search by [employer].

## 2019-07-31 NOTE — ED Notes (Signed)
Sitter at bedside.

## 2019-07-31 NOTE — ED Notes (Signed)
Psych at bedside.

## 2019-07-31 NOTE — ED Notes (Signed)
[facility]  PAPERS  RESCINDED  PER  DR  RAO  INFORMED  RN  Lelon Mast

## 2019-07-31 NOTE — ED Provider Notes (Signed)
-----------------------------------------   12:53 PM on 07/31/2019 -----------------------------------------  I took over care of this patient from Dr. Don Perking.  The patient is alert and oriented x4.  She is clinically sober.  Her UDS came back positive for amphetamine, opiates, and benzodiazepines.  The patient was evaluated by Dr. Smith Robert from psychiatry who has rescinded the IVC and cleared her for discharge.  She is appropriate for outpatient substance abuse counseling should she choose to pursue it.  Dr. Don Perking had evaluated the fetus via ultrasound earlier.  The patient has no new pregnancy related symptoms.  She is stable for discharge home.  I counseled her on the results of the work-up.  Return precautions given, and she expresses understanding.   Dionne Bucy, MD 07/31/19 1254

## 2019-07-31 NOTE — ED Triage Notes (Addendum)
BIB [facility] from [place], pt found in bathroom digging through purse. Initial call for domestic. Pt with Narcan kit found, admitted to endorses Meth or heroine use in the past. Pt words slurring at this time. °

## 2019-07-31 NOTE — Consult Note (Addendum)
[facility] Consult   Reason for Consult:  IVC --patient found intoxicated --in a restroom, police brought her in as a precaution ---third trimester pregnancy   Referring Physician:  ED MD   Patient Identification: Kathy [name] MRN:  [id number] Principal Diagnosis: <principal problem not specified>  Diagnosis:  Substance use disorder  Post Substance Intoxication (type not known  Third trimester pregnancy   Diagnosis   History of bipolar disorder mixed Generalized anxiety   Total Time spent with patient: --6640 -50 Min      Subjective:   Kathy [name] is a 34 y.o. female patient admitted with history of bipolar disorder mixed, Generalized anxiety and polysubstance dependence.  She is in third trimester of pregnancy ---and was found very intoxicated in a restroom ( not clear where yet)  Patient is minimizing vague and focused on leaving.  Awaiting collateral from Mom who I left a message with.  Past alluding to heroin dependence and benzo and cocaine dependence but is in denial of current use despite intoxication.   UDS pending --but past UDS on record show benzos, cocaine amphetamines and opioids   Patient is a poor historian and is vague.  She denies SI and Plans or HI and plans.  She is not in imminent danger to Self and others and has come down from whatever her intoxication has been.  She is actually in denial of drug use, and does not recall the events other than she was supposedly with BF and a group of friends.  Police were called but she cannot recall why.   She has poor insight judgement reliability and does not seem to have interest in rehab and recovery at this time  ---      HPI:  34 y o Past Psychiatric History:  Vague--poor historian but says she had been admitted several years ago to this facility.  No current outpatient psych follow up groups day treatment.    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 Anxiety   . Asthma   . Bipolar disorder (HCC)   . Compression fracture of spine (HCC)   . Fibromyalgia   . Headache   . PTSD (post-traumatic stress disorder)   . Seizures (HCC)    Related to benzo withdrawal  . Seizures (HCC)     Past Surgical History:  Procedure Laterality Date  . HAND SURGERY Right   . HAND SURGERY     Family History: No family history on file. Family Psychiatric  History: parents with depression and anxiety  Social History:  Social History   Substance and Sexual Activity  Alcohol Use Not Currently   Comment: unknown     Social History   Substance and Sexual Activity  Drug Use Not Currently  . Types: Cocaine, Benzodiazepines, Heroin, Hydrocodone   Comment: Cocaine: last used; Heroin: last used 09/17/2016;     Social History   Socioeconomic History  . Marital status:  Single    Spouse name: Not on file  . Number of children: Not on file  . Years of education: Not on file  . Highest education level: Not on file  Occupational History  . Not on file  Tobacco Use  . Smoking status: Current Every Day Smoker  . Smokeless tobacco: Never Used  . Tobacco comment: Pt does not want to stop smoking at this time  Vaping Use  . Vaping Use: Never used  Substance and Sexual Activity  . Alcohol use: Not Currently    Comment: unknown  . Drug use: Not Currently    Types: Cocaine, Benzodiazepines, Heroin, Hydrocodone    Comment: Cocaine: last used; Heroin: last used 09/17/2016;   . Sexual activity: Not Currently    Birth control/protection: Injection     Comment: Depo Provera  Other Topics Concern  . Not on file  Social History Narrative   ** Merged History Encounter **       ** Merged History Encounter **       Social Determinants of Health   Financial Resource Strain:   . Difficulty of Paying Living Expenses:   Food Insecurity:   . Worried About Programme researcher, broadcasting/film/video in the Last Year:   . Barista in the Last Year:   Transportation Needs:   . Freight forwarder (Medical):   Marland Kitchen Lack of Transportation (Non-Medical):   Physical Activity:   . Days of Exercise per Week:   . Minutes of Exercise per Session:   Stress:   . Feeling of Stress :   Social Connections:   . Frequency of Communication with Friends and Family:   . Frequency of Social Gatherings with Friends and Family:   . Attends Religious Services:   . Active Member of Clubs or Organizations:   . Attends Banker Meetings:   Marland Kitchen Marital Status:    Additional Social History:---  None at this time     Allergies:  No Known Allergies  Labs:  Results for orders placed or performed during the hospital encounter of 07/31/19 (from the past 48 hour(s))  CBC with Differential/Platelet     Status: Abnormal   Collection Time: 07/31/19  5:18 AM  Result Value Ref Range   WBC 13.4 (H) 4.0 - 10.5 K/uL   RBC 3.53 (L) 3.87 - 5.11 MIL/uL   Hemoglobin 8.7 (L) 12.0 - 15.0 g/dL    Comment: Reticulocyte Hemoglobin testing may be clinically indicated, consider ordering this additional test [id number]    HCT 27.1 (L) 36 - 46 %   MCV 76.8 (L) 80.0 - 100.0 fL   MCH 24.6 (L) 26.0 - 34.0 pg   MCHC 32.1 30.0 - 36.0 g/dL   RDW 59.7 41.6 - 38.4 %   Platelets 286 150 - 400 K/uL   nRBC 0.1 0.0 - 0.2 %   Neutrophils Relative % 71 %   Neutro Abs 9.6 (H) 1.7 - 7.7 K/uL   Lymphocytes Relative 18 %   Lymphs Abs 2.4 0.7 - 4.0 K/uL   Monocytes Relative 7 %   Monocytes Absolute 0.9 0 - 1 K/uL   Eosinophils Relative 3 %   Eosinophils Absolute 0.3 0 - 0 K/uL   Basophils  Relative 0 %   Basophils Absolute 0.1 0 - 0 K/uL   Immature Granulocytes 1 %   Abs Immature Granulocytes 0.07 0.00 - 0.07 K/uL    Comment: Performed at [facility], [address] [place],  [place] [address]  Comprehensive metabolic panel     Status: Abnormal   Collection Time: 07/31/19  5:18 AM  Result Value Ref Range   Sodium 136 135 - 145 mmol/L   Potassium 3.4 (L) 3.5 - 5.1 mmol/L   Chloride 105 98 - 111 mmol/L   CO2 23 22 - 32 mmol/L   Glucose, Bld 92 70 - 99 mg/dL    Comment: Glucose reference range applies only to samples taken after fasting for at least 8 hours.   BUN 18 6 - 20 mg/dL   Creatinine, Ser 0.63 0.44 - 1.00 mg/dL   Calcium 8.2 (L) 8.9 - 10.3 mg/dL   Total Protein 6.6 6.5 - 8.1 g/dL   Albumin 2.6 (L) 3.5 - 5.0 g/dL   AST 22 15 - 41 U/L   ALT 19 0 - 44 U/L   Alkaline Phosphatase 158 (H) 38 - 126 U/L   Total Bilirubin 0.4 0.3 - 1.2 mg/dL   GFR calc non Af Amer >60 >60 mL/min   GFR calc Af Amer >60 >60 mL/min   Anion gap 8 5 - 15    Comment: Performed at [facility], [address] [place], [place] [address]  hCG, quantitative, pregnancy     Status: Abnormal   Collection Time: 07/31/19  5:18 AM  Result Value Ref Range   hCG, Beta Chain, Quant, S 53,490 (H) <5 mIU/mL    Comment:          GEST. AGE      CONC.  (mIU/mL)   <=1 WEEK        5 - 50     2 WEEKS       50 - 500     3 WEEKS       100 - 10,000     4 WEEKS     1,000 - 30,000     5 WEEKS     3,500 - 115,000   6-8 WEEKS     12,000 - 270,000    12 WEEKS     15,000 - 220,000        FEMALE AND NON-PREGNANT FEMALE:     LESS THAN 5 mIU/mL Performed at [facility], [address] [place], [place] [address]   Ethanol     Status: None   Collection Time: 07/31/19  5:18 AM  Result Value Ref Range   Alcohol, Ethyl (B) <10 <10 mg/dL    Comment: (NOTE) Lowest detectable limit for serum alcohol is 10 mg/dL.  For medical purposes only. Performed at [facility], [address] [place], [place] [address]     No current facility-administered medications for this encounter.   Current Outpatient Medications  Medication Sig Dispense Refill  . albuterol (VENTOLIN HFA) 108 (90 Base) MCG/ACT inhaler Inhale 2 puffs into the lungs every 6 (six) hours as needed for wheezing or shortness of breath. 18 g 0  . ALPRAZolam (XANAX) 1 MG tablet Take 2 mg by mouth 4 (four) times daily.    . benztropine (COGENTIN) 0.5 MG tablet Take 1 tablet (0.5 mg total) by mouth 2 (two) times daily. For prevention 60 tablet 0  . cefUROXime (CEFTIN) 250 MG tablet Take 1 tablet (250 mg total) by mouth 2 (two) times daily with a meal for 7 days. 14 tablet 0  . diazepam (VALIUM) 10 MG tablet Take 20 mg by mouth 2 (two) times daily.    . divalproex (DEPAKOTE) 250 MG DR tablet Take  250 mg by mouth 3 (three) times daily.    . ferrous sulfate 325 (65 FE) MG EC tablet Take 1 tablet (325 mg total) by mouth 2 (two) times daily. 60 tablet 0  . gabapentin (NEURONTIN) 300 MG capsule Take 2 capsules (600 mg total) by mouth 3 (three) times daily at 8am, 3pm and bedtime. For agitation/substance withdrawal syndrome 90 capsule 0  . hydrOXYzine (ATARAX/VISTARIL) 50 MG tablet Take 1 tablet (50 mg) by mouth four times daily as needed: For anxiety 90 tablet 0  . metroNIDAZOLE (FLAGYL) 250 MG tablet Take 1 tablet (250 mg total) by mouth 3 (three) times daily for 7 days. 21 tablet 0  . naloxone (NARCAN) nasal spray 4 mg/0.1 mL Use for overdose of heroin/opioids. 1 each 1  . nicotine polacrilex (NICORETTE) 2 MG gum Take 1 each (2 mg total) by mouth as needed for smoking cessation. (May purchase from over the counter): For smoking cessation 100 tablet 0  . prazosin (MINIPRESS) 1 MG capsule Take 3 capsules (3 mg total) by mouth at bedtime. 90 capsule 0  . propranolol (INDERAL) 20 MG tablet Take 1 tablet (20 mg total) by mouth 2 (two) times daily. 60 tablet 0  . QUEtiapine (SEROQUEL) 300 MG tablet Take 1 tablet (300  mg total) by mouth at bedtime. For mood control 30 tablet 0  . QUEtiapine (SEROQUEL) 50 MG tablet Take 1 tablet (50 mg total) by mouth 3 (three) times daily. For agitation/mood control 90 tablet 0    Musculoskeletal: Strength & Muscle Tone: normal  Gait & Station: waddling due to third trimester  Patient leans: n/a   Psychiatric Specialty Exam: Physical Exam Per ED   Review of Systems 8 systems reviewed   Blood pressure 111/75, pulse 100, temperature 98.2 F (36.8 C), temperature source Oral, resp. rate 17, height 5\' 3"  (1.6 m), weight 65.8 kg, SpO2 98 %.Body mass index is 25.69 kg/m.  General Appearance: anxious unkept R[place]ort poor focuses on leaving poor social and coping skills   Eye Contact:  Okay   Speech:  Somewhat loud and pressured   Volume:  Slightly loud   Mood:  Slightly anxious elevated irritable   Affect:   Anxious   Thought Process:  Illogical/ with stress in denial of issues minimzes    Orientation:  Times four okay Consciousness not fluctuant or clouded   Thought Content:  Themes of wanting to leave no interest in recovery or treatment   Suicidal Thoughts:  none  Homicidal Thoughts:  None   Memory:  She cannot recall recent events due to intoxication that she is in denial of   Judgement:   Very poor   Insight  Very poor   Psychomotor Activity:  Agitated    Concentration:  Poor, interrupts poor social skills goes on tangents   Recall:  Poor   Fund of Knowledge:  Bellow average   Language:  English   Akathisia:  None   Handed:  Not known   AIMS (if indicated):   n/a   Assets:  Not clear   ADL's:  Per home and pregnancy  Cognition:  Unchanged   Sleep:   less when intoxicated and she says last few days      Treatment Plan Summary:  Patient in third trimester pregnancy brought by police after discovering she had an intoxication  UDS pending.  However she has cleared her MS and wants to leave.  She has no imminent SI or HI ---and so will release her  IVC  ---TTS has referred her to substance dependence clinics and centers and for OB Gyn follow up as well.   Not clear if she is taking her bipolar medications listed including Depakote  Prazosin for nightmares and xanax klonopin  She focuses mostly on benzos     Disposition:  Home to parents  Contracts for safety   Roselind Messier, MD 07/31/2019 11:41 AM

## 2019-07-31 NOTE — BH Assessment (Signed)
Assessment Note  Kathy [name] is an 34 y.o. female who reported to [facility] ED involuntary for treatment. Per triage note, BIB [facility] from [place], pt found in bathroom digging through purse. Initial call for domestic. Pt with Narcan kit found, admitted to endorses Meth or heroine use in the past. Pt words slurring at this time.  During TTS assessment Pt presented alert and oriented x 3, irritable but cooperative, and mood-congruent with affect. Pt does appear to be responding to internal and external stimuli. Neither is the patient presenting with any delusional thinking. Pt reported to be unsure why she is here and stated "the police brought me here for suspected drugs use or something, I don't know". Pt is currently unable to remember what happen before her admission or the belongings found on her. Pt denies any recent SA or need to carry a Narcan kit. Pt reports to be currently receiving SA services with [facility] and [facility]. Pt was unable to determine her last time engaging in services stating "I don't know maybe a few weeks ago". Pt refused TTS assistance around verifying active services and availability for support. Pt stated "I have what I need, I don't' need resources I just want to get out of here". Pt denies a MH hx or diagnosis and was unclear regarding any current prescribed medications. Pt denies any family hx of MH/SA. Pt reports no struggles with eating, sleeping, depression or anxiety. Pt denies a hx or any current SI/HI/AH/VH and contracted for safety. Pt provided her mother Arabella Merles, [phone]) as collateral contact.   TTS attempted to contact mom and received her voicemail. TTS left a HIPPA compliant voicemail asking for a call back.  Per Dr. Janese Banks pt will be discharged with the recommendation to follow up with current services and OBGYN provider.   Diagnosis: Substance use disorder   Past Medical History:  Past Medical History:  Diagnosis Date  . Anxiety   . Asthma   .  Bipolar disorder ([place])   . Compression fracture of spine ([place])   . Fibromyalgia   . Headache   . PTSD (post-traumatic stress disorder)   . Seizures ([place])    Related to benzo withdrawal  . Seizures ([place])     Past Surgical History:  Procedure Laterality Date  . HAND SURGERY Right   . HAND SURGERY      Family History: No family history on file.  Social History:  reports that she has been smoking. She has never used smokeless tobacco. She reports previous alcohol use. She reports previous drug use. Drugs: Cocaine, Benzodiazepines, Heroin, and Hydrocodone.  Additional Social History:  Alcohol / Drug Use Pain Medications: see mar Prescriptions: see mar Over the Counter: see mar History of alcohol / drug use?: Yes Substance #1 Name of Substance 1: Meth Substance #2 Name of Substance 2: Heroine  CIWA: CIWA-Ar BP: 111/75 Pulse Rate: 100 COWS:    Allergies: No Known Allergies  Home Medications: (Not in a hospital admission)   OB/GYN Status:  No LMP recorded (lmp unknown). Patient is pregnant.  General Assessment Data Location of Assessment: [facility] ED TTS Assessment: In system Is this a Tele or Face-to-Face Assessment?: Face-to-Face Is this an Initial Assessment or a Re-assessment for this encounter?: Initial Assessment Patient Accompanied by:: N/A Language Other than English: No Living Arrangements: Other (Comment) (Private home) What gender do you identify as?: Female Marital status: Single Maiden name: n/a Pregnancy Status: Yes (Comment: include estimated delivery date) (2 mths) Living Arrangements:  Parent Can pt return to current living arrangement?: Yes Admission Status: Involuntary Petitioner: Police Is patient capable of signing voluntary admission?: Yes Referral Source: Other Insurance type: None   Medical Screening Exam ([place]) Medical Exam completed: Yes  Crisis Care Plan Living Arrangements: Parent Legal Guardian: Other: (self) Name of  Psychiatrist: None reported  Name of Therapist: None reported   Education Status Is patient currently in school?: No Is the patient employed, unemployed or receiving disability?: Unemployed  Risk to self with the past 6 months Suicidal Ideation: No Has patient been a risk to self within the past 6 months prior to admission? : No Suicidal Intent: No Has patient had any suicidal intent within the past 6 months prior to admission? : No Is patient at risk for suicide?: No Suicidal Plan?: No Has patient had any suicidal plan within the past 6 months prior to admission? : No Access to Means: No What has been your use of drugs/alcohol within the last 12 months?: Heroine & Meth Previous Attempts/Gestures: No How many times?: 0 Other Self Harm Risks: None reported  Triggers for Past Attempts: None known Intentional Self Injurious Behavior: None Family Suicide History: No Recent stressful life event(s):  (None reported ) Persecutory voices/beliefs?: No Depression: No Depression Symptoms:  (None reported ) Substance abuse history and/or treatment for substance abuse?: Yes Suicide prevention information given to non-admitted patients: Not applicable  Risk to Others within the past 6 months Homicidal Ideation: No Does patient have any lifetime risk of violence toward others beyond the six months prior to admission? : No Thoughts of Harm to Others: No Current Homicidal Intent: No Current Homicidal Plan: No Access to Homicidal Means: No Identified Victim: n/a History of harm to others?: No Assessment of Violence: None Noted Violent Behavior Description: n/a Does patient have access to weapons?: No Criminal Charges Pending?: No Does patient have a court date: No Is patient on probation?: No  Psychosis Hallucinations: None noted Delusions: None noted  Mental Status Report Appearance/Hygiene: In hospital gown Eye Contact: Good Motor Activity: Freedom of movement Speech:  Logical/coherent Level of Consciousness: Alert, Irritable Mood: Anxious, Irritable, Pleasant Affect: Anxious, Irritable Anxiety Level: Minimal Thought Processes: Coherent, Relevant Judgement: Partial Orientation: Appropriate for developmental age Obsessive Compulsive Thoughts/Behaviors: None  Cognitive Functioning Concentration: Good Memory: Recent Intact, Remote Intact Is patient IDD: No Insight: Poor Impulse Control: Fair Appetite: Good Have you had any weight changes? : No Change Sleep: No Change Total Hours of Sleep: 8 Vegetative Symptoms: None     Prior Inpatient Therapy Prior Inpatient Therapy: No  Prior Outpatient Therapy Prior Outpatient Therapy: Yes Prior Therapy Dates: a few weeks ago Prior Therapy Facilty/Provider(s): [facility]  Reason for Treatment: SA Does patient have an ACCT team?: No Does patient have Intensive In-House Services?  : No Does patient have [facility] services? : No Does patient have P4CC services?: No  ADL Screening (condition at time of admission) Is the patient deaf or have difficulty hearing?: No Does the patient have difficulty seeing, even when wearing glasses/contacts?: No Does the patient have difficulty concentrating, remembering, or making decisions?: No Does the patient have difficulty dressing or bathing?: No Does the patient have difficulty walking or climbing stairs?: No Weakness of Legs: None Weakness of Arms/Hands: None  Home Assistive Devices/Equipment Home Assistive Devices/Equipment: None  Therapy Consults (therapy consults require a physician order) PT Evaluation Needed: No OT Evalulation Needed: No SLP Evaluation Needed: No Abuse/Neglect Assessment (Assessment to be complete while patient  is alone) Abuse/Neglect Assessment Can Be Completed: Yes Physical Abuse: Denies Verbal Abuse: Denies Sexual Abuse: Denies Exploitation of patient/patient's resources: Denies Self-Neglect: Denies Values /  Beliefs Cultural Requests During Hospitalization: None Spiritual Requests During Hospitalization: None Consults Spiritual Care Consult Needed: No Transition of Care Team Consult Needed: No Advance Directives (For Healthcare) Does Patient Have a Medical Advance Directive?: No          Disposition:  Disposition Initial Assessment Completed for this Encounter: Yes Patient referred to: Other (Comment)  On Site Evaluation by:   Reviewed with Physician:    Shanon Ace 07/31/2019 1:01 PM

## 2019-07-31 NOTE — ED Notes (Signed)
Pt given phone to call family. Pt given drink and graham crackers.

## 2019-07-31 NOTE — Discharge Instructions (Addendum)
Return to the ER for new or worsening weakness, lightheadedness, sleepiness, any mental health symptoms such as thoughts of wanting hurt yourself or others, pregnancy-related symptoms such as abdominal pain, vaginal bleeding, leakage of fluid, or decreased fetal movement, or any other new or worsening symptoms that concern you.

## 2019-07-31 NOTE — ED Provider Notes (Signed)
[facility] Emergency Department Provider Note  ____________________________________________  Time seen: Approximately 5:46 AM  I have reviewed the triage vital signs and the nursing notes.   HISTORY  Chief Complaint Ingestion  Level 5 caveat:  Portions of the history and physical were unable to be obtained due to intoxication   HPI Kathy [name] is a 34 y.o. female with a history of polysubstance abuse, IV drug use, asthma, anxiety, fibromyalgia, hepatitis C who presents IVC for altered mental status.  Patient was found intoxicated and altered inside the bathroom at a [place] gas station.  Patient reports taking 1 x 50 mg tramadol.   She does report history of methamphetamine use but denies any today.  Patient is extremely intoxicated, slurring speech, very confused.  Has several track marks in all extremities.  She is currently 7 months pregnant.  Past Medical History:  Diagnosis Date  . Anxiety   . Asthma   . Bipolar disorder (HCC)   . Compression fracture of spine (HCC)   . Fibromyalgia   . Headache   . PTSD (post-traumatic stress disorder)   . Seizures (HCC)    Related to benzo withdrawal  . Seizures [facility])     Patient Active Problem List   Diagnosis Date Noted  . Opiate overdose (HCC)   . Polysubstance (excluding opioids) dependence, daily use (HCC) 11/30/2016  . Polysubstance dependence including opioid type drug, continuous use (HCC) 11/30/2016  . History of 3 spontaneous abortions 03/24/2016  . Eczema 03/24/2016  . Depression 03/24/2016  . Asthma without status asthmaticus without complication 03/24/2016  . Anxiety 03/24/2016  . Opiate abuse, continuous (HCC) 03/24/2016  . Fibromyalgia 04/17/2015  . Chronic hepatitis C virus infection (HCC) 02/28/2015  . Chronic migraine 07/23/2014  . Bipolar disorder (HCC) 05/10/2013  . PTSD (post-traumatic stress disorder) 05/10/2013  . Polysubstance abuse (HCC) 10/03/2012    Past Surgical  History:  Procedure Laterality Date  . HAND SURGERY Right   . HAND SURGERY      Prior to Admission medications   Medication Sig Start Date End Date Taking? Authorizing Provider  albuterol (VENTOLIN HFA) 108 (90 Base) MCG/ACT inhaler Inhale 2 puffs into the lungs every 6 (six) hours as needed for wheezing or shortness of breath. 03/28/19   Phineas Semen, MD  ALPRAZolam Prudy Feeler) 1 MG tablet Take 2 mg by mouth 4 (four) times daily. 09/26/18   [provider]  benztropine (COGENTIN) 0.5 MG tablet Take 1 tablet (0.5 mg total) by mouth 2 (two) times daily. For prevention 04/04/17   Armandina Stammer I, NP  cefUROXime (CEFTIN) 250 MG tablet Take 1 tablet (250 mg total) by mouth 2 (two) times daily with a meal for 7 days. 07/30/19 08/06/19  Concha Se, MD  diazepam (VALIUM) 10 MG tablet Take 20 mg by mouth 2 (two) times daily. 09/26/18   [provider]  divalproex (DEPAKOTE) 250 MG DR tablet Take 250 mg by mouth 3 (three) times daily. 08/16/18   [provider]  ferrous sulfate 325 (65 FE) MG EC tablet Take 1 tablet (325 mg total) by mouth 2 (two) times daily. 07/30/19 08/29/19  Concha Se, MD  gabapentin (NEURONTIN) 300 MG capsule Take 2 capsules (600 mg total) by mouth 3 (three) times daily at 8am, 3pm and bedtime. For agitation/substance withdrawal syndrome 04/04/17   Armandina Stammer I, NP  hydrOXYzine (ATARAX/VISTARIL) 50 MG tablet Take 1 tablet (50 mg) by mouth four times daily as needed: For anxiety 04/04/17  Armandina Stammer I, NP  metroNIDAZOLE (FLAGYL) 250 MG tablet Take 1 tablet (250 mg total) by mouth 3 (three) times daily for 7 days. 07/30/19 08/06/19  Concha Se, MD  naloxone [facility]) nasal spray 4 mg/0.1 mL Use for overdose of heroin/opioids. 12/25/18   Concha Se, MD  nicotine polacrilex (NICORETTE) 2 MG gum Take 1 each (2 mg total) by mouth as needed for smoking cessation. (May purchase from over the counter): For smoking cessation 04/04/17   Armandina Stammer I, NP  prazosin  (MINIPRESS) 1 MG capsule Take 3 capsules (3 mg total) by mouth at bedtime. 04/04/17   Thermon Leyland, NP  propranolol (INDERAL) 20 MG tablet Take 1 tablet (20 mg total) by mouth 2 (two) times daily. 04/05/17   Thermon Leyland, NP  QUEtiapine (SEROQUEL) 300 MG tablet Take 1 tablet (300 mg total) by mouth at bedtime. For mood control 04/04/17   Armandina Stammer I, NP  QUEtiapine (SEROQUEL) 50 MG tablet Take 1 tablet (50 mg total) by mouth 3 (three) times daily. For agitation/mood control 04/04/17   Sanjuana Kava, NP    Allergies Patient has no known allergies.  No family history on file.  Social History Social History   Tobacco Use  . Smoking status: Current Every Day Smoker  . Smokeless tobacco: Never Used  . Tobacco comment: Pt does not want to stop smoking at this time  Vaping Use  . Vaping Use: Never used  Substance Use Topics  . Alcohol use: Not Currently    Comment: unknown  . Drug use: Not Currently    Types: Cocaine, Benzodiazepines, Heroin, Hydrocodone    Comment: Cocaine: last used; Heroin: last used 09/17/2016;     Review of Systems  Constitutional: Negative for fever. + AMS  ____________________________________________   PHYSICAL EXAM:  VITAL SIGNS: ED Triage Vitals [07/31/19 0514]  Enc Vitals Group     BP      Pulse      Resp      Temp      Temp src      SpO2 98 %     Weight      Height      Head Circumference      Peak Flow      Pain Score      Pain Loc      Pain Edu?      Excl. in GC?     Constitutional: Awake but falling asleep easily, extremely intoxicated, pupils are 2 mm and reactive  HEENT:      Head: Normocephalic and atraumatic.         Eyes: Conjunctivae are normal. Sclera is non-icteric.       Mouth/Throat: Mucous membranes are moist.       Neck: Supple with no signs of meningismus. Cardiovascular: Regular rate and rhythm. No murmurs, gallops, or rubs.  Respiratory: Normal respiratory effort. Lungs are clear to auscultation  bilaterally. Gastrointestinal: Gravid and non tender Musculoskeletal: Atraumatic with several track marks in all 4 extremities.  No edema, erythema, cyanosis Neurologic: Slurred speech. Face is symmetric. Moving all extremities. No gross focal neurologic deficits are appreciated. Skin: Skin is warm, dry and intact. No rash noted.  ____________________________________________   LABS (all labs ordered are listed, but only abnormal results are displayed)  Labs Reviewed  CBC WITH DIFFERENTIAL/PLATELET - Abnormal; Notable for the following components:      Result Value   WBC 13.4 (*)    RBC 3.53 (*)  Hemoglobin 8.7 (*)    HCT 27.1 (*)    MCV 76.8 (*)    MCH 24.6 (*)    Neutro Abs 9.6 (*)    All other components within normal limits  COMPREHENSIVE METABOLIC PANEL - Abnormal; Notable for the following components:   Potassium 3.4 (*)    Calcium 8.2 (*)    Albumin 2.6 (*)    Alkaline Phosphatase 158 (*)    All other components within normal limits  URINE DRUG SCREEN, QUALITATIVE ([facility] ONLY) - Abnormal; Notable for the following components:   Amphetamines, Ur Screen POSITIVE (*)    Opiate, Ur Screen POSITIVE (*)    Benzodiazepine, Ur Scrn POSITIVE (*)    All other components within normal limits  HCG, QUANTITATIVE, PREGNANCY - Abnormal; Notable for the following components:   hCG, Beta Chain, Quant, S 53,490 (*)    All other components within normal limits  ETHANOL   ____________________________________________  EKG  ED ECG REPORT I, Nita Sickle, the attending physician, personally viewed and interpreted this ECG.  Sinus tachycardia, rate of 124, normal intervals, normal axis, no ST elevations or depressions.  Otherwise normal EKG. ____________________________________________  RADIOLOGY  none  ____________________________________________   PROCEDURES  Procedure(s) performed:yes .1-3 Lead EKG Interpretation Performed by: Nita Sickle, MD Authorized by:  Nita Sickle, MD     Interpretation: normal     ECG rate assessment: tachycardic     Rhythm: sinus tachycardia     Ectopy: none     Conduction: normal     Critical Care performed:  None ____________________________________________   INITIAL IMPRESSION / ASSESSMENT AND PLAN / ED COURSE  34 y.o. female with a history of polysubstance abuse, IV drug use, asthma, anxiety, fibromyalgia, hepatitis C who presents IVC for altered mental status concerning for drug intoxication.  Patient is extremely intoxicated, slurring his speech, falling asleep, no signs of trauma, she has track marks in all 4 extremities.  Sadly patient is 7 months pregnant with her fourth pregnancy.  Bedside ultrasound shows a normal IUP with fetal heart rate of 149.  Patient was placed on end-tidal for monitoring of airway.  At this time no indication for Narcan as patient is maintaining her airway and breathing normally.  EKG was done showing normal intervals.  Labs and drug screen pending.  Patient is under IVC and will remain until medically clear and evaluated by psychiatry.  Old medical records reviewed showing several prior visits to the hospital for polysubstance abuse.  ED COURSE: Patient continues to be intoxicated but maintaining airway. Will continue to monitor and reassess once sober. Care transferred to incoming MD at 7AM    _____________________________________________ Please note:  Patient was evaluated in Emergency Department today for the symptoms described in the history of present illness. Patient was evaluated in the context of the global COVID-19 pandemic, which necessitated consideration that the patient might be at risk for infection with the SARS-CoV-2 virus that causes COVID-19. Institutional protocols and algorithms that pertain to the evaluation of patients at risk for COVID-19 are in a state of rapid change based on information released by regulatory bodies including the CDC and federal and state  organizations. These policies and algorithms were followed during the patient's care in the ED.  Some ED evaluations and interventions may be delayed as a result of limited staffing during the pandemic.   [place] Controlled Substance Database was reviewed by me. ____________________________________________   FINAL CLINICAL IMPRESSION(S) / ED DIAGNOSES   Final diagnoses:  Drug intoxication  with complication ([place])  Encephalopathy      NEW MEDICATIONS STARTED DURING THIS VISIT:  ED Discharge Orders    None       Note:  This document was prepared using Dragon voice recognition software and may include unintentional dictation errors.    Alfred Levins, [place], MD 08/03/19 [phone]

## 2019-08-08 ENCOUNTER — Other Ambulatory Visit: Payer: Self-pay

## 2019-08-08 ENCOUNTER — Emergency Department
Admission: EM | Admit: 2019-08-08 | Discharge: 2019-08-09 | Disposition: A | Discharge status: Home / Self Care | Payer: Medicaid Other | Attending: Emergency Medicine | Admitting: Emergency Medicine

## 2019-08-08 DIAGNOSIS — Z79899 Other long term (current) drug therapy: Secondary | ICD-10-CM | POA: Diagnosis not present

## 2019-08-08 DIAGNOSIS — O99320 Drug use complicating pregnancy, unspecified trimester: Secondary | ICD-10-CM

## 2019-08-08 DIAGNOSIS — O99323 Drug use complicating pregnancy, third trimester: Secondary | ICD-10-CM | POA: Diagnosis present

## 2019-08-08 DIAGNOSIS — F17200 Nicotine dependence, unspecified, uncomplicated: Secondary | ICD-10-CM | POA: Insufficient documentation

## 2019-08-08 DIAGNOSIS — F1910 Other psychoactive substance abuse, uncomplicated: Secondary | ICD-10-CM | POA: Diagnosis not present

## 2019-08-08 DIAGNOSIS — Z3493 Encounter for supervision of normal pregnancy, unspecified, third trimester: Secondary | ICD-10-CM

## 2019-08-08 DIAGNOSIS — J45909 Unspecified asthma, uncomplicated: Secondary | ICD-10-CM | POA: Diagnosis not present

## 2019-08-08 DIAGNOSIS — Z3A33 33 weeks gestation of pregnancy: Secondary | ICD-10-CM | POA: Diagnosis not present

## 2019-08-08 DIAGNOSIS — F1990 Other psychoactive substance use, unspecified, uncomplicated: Secondary | ICD-10-CM

## 2019-08-08 MED ORDER — SODIUM CHLORIDE 0.9 % IV BOLUS
1000.0000 mL | Freq: Once | INTRAVENOUS | Status: AC
  Administered 2019-08-09: 1000 mL via INTRAVENOUS

## 2019-08-08 NOTE — ED Triage Notes (Signed)
PT to ED with EMS and [employer]. PT was at a store, locked herself in the bathroom for a while and came out actign strange. PT is slurring words and barely opening eyes. [employer] reports pt has been fallig asleep. She also told [employer] she had bad heart burn. PT is covered in a red itchy rash, states she is homeless. Currently tearful, upset over beign IVC (IVC d/t pt using while pregant. Pt admits to suboxone use)

## 2019-08-08 NOTE — ED Provider Notes (Signed)
[facility] Emergency Department Provider Note  ____________________________________________   First MD Initiated Contact with Patient 08/08/19 2330     (approximate)  I have reviewed the triage vital signs and the nursing notes.  Level 5 caveat history review of system limited secondary to altered mental status HISTORY  Chief Complaint Chest Pain    HPI Kathy [name] is a 34 y.o. female with below list of previous medical conditions including polysubstance abuse  reportedly 7 months pregnant presents to the emergency department via EMS in [facility] Department custody involuntarily committed for illicit drug use urine pregnancy.  Police officer state that the patient was at a store where she locked herself in the bathroom for an extended period of time and subsequently emerged with very bizarre behavior slurring words barely able to stay awake.  Patient falling asleep during evaluation.  Asked if she used any illicit drugs tonight the patient states "I think it was a Xan bar or fentanyl or something".       Past Medical History:  Diagnosis Date  . Anxiety   . Asthma   . Bipolar disorder (HCC)   . Compression fracture of spine (HCC)   . Fibromyalgia   . Headache   . PTSD (post-traumatic stress disorder)   . Seizures (HCC)    Related to benzo withdrawal  . Seizures [facility])     Patient Active Problem List   Diagnosis Date Noted  . Opiate overdose (HCC)   . Polysubstance (excluding opioids) dependence, daily use (HCC) 11/30/2016  . Polysubstance dependence including opioid type drug, continuous use (HCC) 11/30/2016  . History of 3 spontaneous abortions 03/24/2016  . Eczema 03/24/2016  . Depression 03/24/2016  . Asthma without status asthmaticus without complication 03/24/2016  . Anxiety 03/24/2016  . Opiate abuse, continuous (HCC) 03/24/2016  . Fibromyalgia 04/17/2015  . Chronic hepatitis C virus infection (HCC) 02/28/2015  . Chronic  migraine 07/23/2014  . Bipolar disorder (HCC) 05/10/2013  . PTSD (post-traumatic stress disorder) 05/10/2013  . Polysubstance abuse (HCC) 10/03/2012    Past Surgical History:  Procedure Laterality Date  . HAND SURGERY Right   . HAND SURGERY      Prior to Admission medications   Medication Sig Start Date End Date Taking? Authorizing Provider  albuterol (VENTOLIN HFA) 108 (90 Base) MCG/ACT inhaler Inhale 2 puffs into the lungs every 6 (six) hours as needed for wheezing or shortness of breath. 03/28/19  Yes Phineas Semen, MD  buprenorphine-naloxone (SUBOXONE) 8-2 mg SUBL SL tablet Place 1 tablet under the tongue daily.   Yes [provider]  cefUROXime (CEFTIN) 250 MG tablet Take 250 mg by mouth 2 (two) times daily with a meal.   Yes [provider]  ferrous sulfate 325 (65 FE) MG EC tablet Take 1 tablet (325 mg total) by mouth 2 (two) times daily. 07/30/19 08/29/19 Yes Concha Se, MD  metroNIDAZOLE (FLAGYL) 250 MG tablet Take 250 mg by mouth 3 (three) times daily.   Yes [provider]  naloxone [facility]) nasal spray 4 mg/0.1 mL Use for overdose of heroin/opioids. 12/25/18  Yes Concha Se, MD  nicotine polacrilex (NICORETTE) 2 MG gum Take 1 each (2 mg total) by mouth as needed for smoking cessation. (May purchase from over the counter): For smoking cessation 04/04/17  Yes Armandina Stammer I, NP  diazepam (VALIUM) 10 MG tablet Take 20 mg by mouth 2 (two) times daily. Patient not taking: Reported on 08/09/2019 09/26/18   [provider]  gabapentin (NEURONTIN) 300 MG capsule Take 300 mg by mouth 3 (three) times daily. Patient not taking: Reported on 08/09/2019    [provider]  hydrOXYzine (ATARAX/VISTARIL) 50 MG tablet Take 1 tablet (50 mg) by mouth four times daily as needed: For anxiety Patient not taking: Reported on 08/09/2019 04/04/17   Armandina Stammer I, NP  rOPINIRole (REQUIP) 1 MG tablet Take 1 mg by mouth at bedtime. Patient not taking: Reported  on 08/09/2019    [provider]    Allergies Patient has no known allergies.  No family history on file.  Social History Social History   Tobacco Use  . Smoking status: Current Every Day Smoker  . Smokeless tobacco: Never Used  . Tobacco comment: Pt does not want to stop smoking at this time  Vaping Use  . Vaping Use: Never used  Substance Use Topics  . Alcohol use: Not Currently    Comment: unknown  . Drug use: Not Currently    Types: Cocaine, Benzodiazepines, Heroin, Hydrocodone    Comment: Cocaine: last used; Heroin: last used 09/17/2016;     Review of Systems Constitutional: No fever/chills Eyes: No visual changes. ENT: No sore throat. Cardiovascular: Denies chest pain. Respiratory: Denies shortness of breath. Gastrointestinal: No abdominal pain.  No nausea, no vomiting.  No diarrhea.  No constipation. Genitourinary: Negative for dysuria. Musculoskeletal: Negative for neck pain.  Negative for back pain. Integumentary: Negative for rash. Neurological: Negative for headaches, focal weakness or numbness. Psychiatric:    ____________________________________________   PHYSICAL EXAM:  VITAL SIGNS: ED Triage Vitals  Enc Vitals Group     BP 08/08/19 2314 132/85     Pulse Rate 08/08/19 2314 (!) 102     Resp 08/08/19 2314 12     Temp 08/08/19 2314 97.7 F (36.5 C)     Temp src --      SpO2 08/08/19 2314 95 %     Weight 08/08/19 2315 72.6 kg (160 lb)     Height 08/08/19 2315 1.651 m (5\' 5" )     Head Circumference --      Peak Flow --      Pain Score 08/08/19 2315 6     Pain Loc --      Pain Edu? --      Excl. in GC? --     Constitutional: Alert and oriented.  Very bizarre behavior somnolent but easily arousable to verbal stimuli Eyes: Conjunctivae are normal.  Head: Atraumatic. Nose: No congestion/rhinnorhea. Mouth/Throat: Patient is wearing a mask. Neck: No stridor.  No meningeal signs.   Cardiovascular: Normal rate, regular rhythm. Good  peripheral circulation. Grossly normal heart sounds. Respiratory: Normal respiratory effort.  No retractions. Gastrointestinal: Soft and nontender. No distention.  Musculoskeletal: No lower extremity tenderness nor edema. No gross deformities of extremities. Neurologic:  Normal speech and language. No gross focal neurologic deficits are appreciated.  Skin:  Skin is warm, dry and intact. Psychiatric: Very bizarre behavior.  ____________________________________________   LABS (all labs ordered are listed, but only abnormal results are displayed)  Labs Reviewed  CBC - Abnormal; Notable for the following components:      Result Value   WBC 12.1 (*)    RBC 3.45 (*)    Hemoglobin 8.5 (*)    HCT 26.7 (*)    MCV 77.4 (*)    MCH 24.6 (*)    All other components within normal limits  COMPREHENSIVE METABOLIC PANEL - Abnormal; Notable for the following components:   Calcium  7.9 (*)    Total Protein 6.3 (*)    Albumin 2.4 (*)    Alkaline Phosphatase 203 (*)    All other components within normal limits  HCG, QUANTITATIVE, PREGNANCY - Abnormal; Notable for the following components:   hCG, Beta Chain, Quant, S 47,247 (*)    All other components within normal limits  URINE DRUG SCREEN, QUALITATIVE ([facility] ONLY) - Abnormal; Notable for the following components:   Amphetamines, Ur Screen POSITIVE (*)    Opiate, Ur Screen POSITIVE (*)    Benzodiazepine, Ur Scrn POSITIVE (*)    All other components within normal limits  ETHANOL  TROPONIN I (HIGH SENSITIVITY)   ____________________________________________  EKG  ED ECG REPORT I, [place] N Amada Hallisey, the attending physician, personally viewed and interpreted this ECG.   Date: 08/08/2019  EKG Time: 11:16 PM  Rate: 99  Rhythm: Normal sinus rhythm  Axis: Normal  Intervals: Normal  ST&T Change: None  ____________________________________________  RADIOLOGY I, [place] N Zebediah Beezley, personally viewed and evaluated these images (plain radiographs)  as part of my medical decision making, as well as reviewing the written report by the radiologist.  ED MD interpretation:  *Single live intrauterine pregnancy 33 weeks 4 days with appropriate growth based on ultrasound from 04/04/2019 per radiologist on ultrasound OB and impression  Official radiology report(s): US OB Limited  Result Date: 08/09/2019 CLINICAL DATA:  Altered mental status, substance abuse complicating pregnancy. Approximately 7 months pregnant. Gestational age by first ultrasound 33 weeks 3 days for ultrasound [facility] 09/24/2019 EXAM: LIMITED OBSTETRIC ULTRASOUND FINDINGS: Number of Fetuses: 1 Heart Rate:  125 bpm Movement: Yes Presentation: Cephalic Placental Location: Anterior Previa: No Amniotic Fluid (Subjective):  Within normal limits. BPD: 8.3 cm 33 w  4 d MATERNAL FINDINGS: Cervix:  Not well visualized. Uterus/Adnexae: No abnormality visualized.  Neither ovary was seen. IMPRESSION: Single live intrauterine pregnancy estimated gestational age [redacted] weeks 4 days. There has been appropriate fetal growth from 04/04/2019 ultrasound. No apparent complication. Cervix not well visualized. This exam is performed on an emergent basis and does not comprehensively evaluate fetal size, dating, or anatomy; follow-up complete OB US should be considered if further fetal assessment is warranted. Electronically Signed   By: Narda Rutherford M.D.   On: 08/09/2019 01:46    ____________________________________________   Procedures   ____________________________________________   INITIAL IMPRESSION / MDM / ASSESSMENT AND PLAN / ED COURSE  As part of my medical decision making, I reviewed the following data within the electronic MEDICAL RECORD NUMBER   34 year old female with above-stated history and physical exam who appears to be under the influence of illicit drugs in the setting of being 7 months pregnant.  OB ultrasound reveals single live intrauterine pregnancy with appropriate growth in comparison  to previous ultrasound.  Patient is involuntarily committed and awaiting psychiatry evaluation and disposition.  ____________________________________________  FINAL CLINICAL IMPRESSION(S) / ED DIAGNOSES  Final diagnoses:  Substance use  Third trimester pregnancy     MEDICATIONS GIVEN DURING THIS VISIT:  Medications  sodium chloride 0.9 % bolus 1,000 mL (0 mLs Intravenous Stopped 08/09/19 0134)  sodium chloride 0.9 % bolus 1,000 mL (0 mLs Intravenous Stopped 08/09/19 0506)     ED Discharge Orders    None      *Please note:  Kathy [name] was evaluated in Emergency Department on 08/09/2019 for the symptoms described in the history of present illness. She was evaluated in the context of the global COVID-19 pandemic, which necessitated consideration that the patient  might be at risk for infection with the SARS-CoV-2 virus that causes COVID-19. Institutional protocols and algorithms that pertain to the evaluation of patients at risk for COVID-19 are in a state of rapid change based on information released by regulatory bodies including the CDC and federal and state organizations. These policies and algorithms were followed during the patient's care in the ED.  Some ED evaluations and interventions may be delayed as a result of limited staffing during and after the pandemic.*  Note:  This document was prepared using Dragon voice recognition software and may include unintentional dictation errors.   Gregor Hams, MD 08/09/19 2242

## 2019-08-09 ENCOUNTER — Emergency Department: Payer: Medicaid Other

## 2019-08-09 LAB — COMPREHENSIVE METABOLIC PANEL
ALT: 22 U/L (ref 0–44)
AST: 29 U/L (ref 15–41)
Albumin: 2.4 g/dL — ABNORMAL LOW (ref 3.5–5.0)
Alkaline Phosphatase: 203 U/L — ABNORMAL HIGH (ref 38–126)
Anion gap: 6 (ref 5–15)
BUN: 19 mg/dL (ref 6–20)
CO2: 25 mmol/L (ref 22–32)
Calcium: 7.9 mg/dL — ABNORMAL LOW (ref 8.9–10.3)
Chloride: 104 mmol/L (ref 98–111)
Creatinine, Ser: 0.86 mg/dL (ref 0.44–1.00)
GFR calc Af Amer: >60 mL/min (ref >60)
GFR calc non Af Amer: >60 mL/min (ref >60)
Glucose, Bld: 96 mg/dL (ref 70–99)
Potassium: 3.8 mmol/L (ref 3.5–5.1)
Sodium: 135 mmol/L (ref 135–145)
Total Bilirubin: 0.5 mg/dL (ref 0.3–1.2)
Total Protein: 6.3 g/dL — ABNORMAL LOW (ref 6.5–8.1)

## 2019-08-09 LAB — CBC
HCT: 26.7 % — ABNORMAL LOW (ref 36.0–46.0)
Hemoglobin: 8.5 g/dL — ABNORMAL LOW (ref 12.0–15.0)
MCH: 24.6 pg — ABNORMAL LOW (ref 26.0–34.0)
MCHC: 31.8 g/dL (ref 30.0–36.0)
MCV: 77.4 fL — ABNORMAL LOW (ref 80.0–100.0)
Platelets: 319 10*3/uL (ref 150–400)
RBC: 3.45 MIL/uL — ABNORMAL LOW (ref 3.87–5.11)
RDW: 14.9 % (ref 11.5–15.5)
WBC: 12.1 10*3/uL — ABNORMAL HIGH (ref 4.0–10.5)
nRBC: 0 % (ref 0.0–0.2)

## 2019-08-09 LAB — URINE DRUG SCREEN, QUALITATIVE (ARMC ONLY)
Amphetamines, Ur Screen: POSITIVE — AB
Barbiturates, Ur Screen: NOT DETECTED
Benzodiazepine, Ur Scrn: POSITIVE — AB
Cannabinoid 50 Ng, Ur ~~LOC~~: NOT DETECTED
Cocaine Metabolite,Ur ~~LOC~~: NOT DETECTED
MDMA (Ecstasy)Ur Screen: NOT DETECTED
Methadone Scn, Ur: NOT DETECTED
Opiate, Ur Screen: POSITIVE — AB
Phencyclidine (PCP) Ur S: NOT DETECTED
Tricyclic, Ur Screen: NOT DETECTED

## 2019-08-09 LAB — TROPONIN I (HIGH SENSITIVITY): Troponin I (High Sensitivity): 9 ng/L (ref <18)

## 2019-08-09 LAB — HCG, QUANTITATIVE, PREGNANCY: hCG, Beta Chain, Quant, S: 47247 m[IU]/mL — ABNORMAL HIGH (ref <5)

## 2019-08-09 LAB — ETHANOL: Alcohol, Ethyl (B): <10 mg/dL (ref <10)

## 2019-08-09 MED ORDER — SODIUM CHLORIDE 0.9 % IV BOLUS
1000.0000 mL | Freq: Once | INTRAVENOUS | Status: AC
  Administered 2019-08-09: 1000 mL via INTRAVENOUS

## 2019-08-09 NOTE — BH Assessment (Signed)
Writer spoke with the patient to complete an updated/reassessment. Upon interview pt. Was irritable and hyper focused on discharging. Patient denies SI/HI and AV/H. Patient does not meet inpatient criteria. This Clinical research associate provided resources for the pt to follow up upon discharging.

## 2019-08-09 NOTE — ED Notes (Signed)
Pt continues to sleep and will not wake to answer questions. [employer] still outside the door.

## 2019-08-09 NOTE — ED Notes (Addendum)
Pt moved to room 24. Report received by Ruben Reason. Pt alert and talkative asking for a snack and juice. Provided.

## 2019-08-09 NOTE — ED Notes (Signed)
Pt given meal tray at this time. Pt also informed of message from "[place]". Pt became upset and states she just wants to go back to [facility].

## 2019-08-09 NOTE — ED Notes (Signed)
Pt continues to sleep soundly. [place] at the bedside. No urine output yet.

## 2019-08-09 NOTE — ED Notes (Signed)
Pt verbalized discharge instructions and has no questions at this time 

## 2019-08-09 NOTE — Final Progress Note (Signed)
Physician Final Progress Note  Patient ID: Kathy [name] MRN: [id number] DOB/AGE: 35/20/1987 35 y.o.  Admit date: 08/08/2019 Admitting provider:   ED MD and Psych ER MD   TTS   Discharge date: 08/09/2019   Admission Diagnoses:  Opioid intoxication, dependence withdrawal  Polysubstance dependence  Third trimester pregnancy  Bipolar disorder mixed    Discharge Diagnoses:  Active Problems:   * No active hospital problems. *   Same as above   Consults: psychiatry  Significant Findings/ Diagnostic Studies:  None further   Procedures:  None further   Discharge Condition: fair   Mental Status  Angry, poor rapport and social skills Oriented to person place date and time  Consciousness not clouded or fluctuant Patient's mood is irritable edgy frustrated  She anxious angry and irritable Speech somewhat loud pressured Obsessed with wanting to leave  No active SI HI or plans contracts for safety just wants to go to friends house after finding out parents will not bring her home       Disposition:   Home to friend not parents they refuse to pick up and enable patient  Spoke to Mom at length --to explain issues diagnosis and matters at hand  Offered women;s shelters but she refused   Diet:  As tolerated  Discharge Activity: {   Suggested half way house, bipolar meds, inpatient treatment ---NA and related programming  Work study program ---prenatal care ----all refused   IVC rescinded ---patient to go home with friend who will pick her up   She requests no medications for bipolar and for other related matters    Smith Robert MD                Total time spent taking care of this patient:  One hour     Signed: Roselind Messier 08/09/2019, 12:34 PM

## 2019-08-09 NOTE — ED Notes (Signed)
Attempt to obtain urine via in and out cath unsuccessful. MD aware. Pt states she is able to urinate on her own. Purewick placed on pt while this RN obtains an IV. Pt falls asleep immediately and is unable to stay awake long enough to provide a sample. Purewick left on pt in case she wakes up.

## 2019-08-09 NOTE — ED Provider Notes (Signed)
2:17 PM Patient has been seen and evaluated by psychiatry team and deemed appropriate for discharge. Patient determined not to be a threat to themselves or others. IVC rescinded by psychiatry team. Patient is otherwise medically clear and stable for discharge.      Miguel Aschoff., MD 08/09/19 [phone]

## 2019-08-09 NOTE — Discharge Instructions (Addendum)
Follow-up with your regular doctor and psychiatrist.  Continue taking your medications as prescribed.  Return to the ER for new or recurrent thoughts of suicide, any other thoughts of wanting to hurt yourself or anyone else, or any other new or worsening symptoms that concern you.  

## 2019-08-09 NOTE — ED Notes (Signed)
BPD outside the room within view of pt.

## 2019-08-09 NOTE — ED Notes (Signed)
Alvera Novel (Fiance) [phone]  Called to request update on pt status. No information given per no information listed in chart at this time. Will check with pt

## 2019-08-09 NOTE — ED Notes (Signed)
Psych at bedside.

## 2019-08-11 ENCOUNTER — Encounter: Payer: Self-pay | Admitting: Anesthesiology

## 2019-08-11 ENCOUNTER — Observation Stay: Payer: Medicaid Other

## 2019-08-11 ENCOUNTER — Inpatient Hospital Stay
Admission: AD | Admit: 2019-08-11 | Discharge: 2019-08-12 | DRG: 806 | Disposition: A | Discharge status: Home / Self Care | Payer: Medicaid Other

## 2019-08-11 ENCOUNTER — Other Ambulatory Visit: Payer: Self-pay

## 2019-08-11 ENCOUNTER — Encounter: Payer: Self-pay | Admitting: Obstetrics and Gynecology

## 2019-08-11 DIAGNOSIS — O9952 Diseases of the respiratory system complicating childbirth: Secondary | ICD-10-CM | POA: Diagnosis present

## 2019-08-11 DIAGNOSIS — F411 Generalized anxiety disorder: Secondary | ICD-10-CM | POA: Diagnosis present

## 2019-08-11 DIAGNOSIS — F1990 Other psychoactive substance use, unspecified, uncomplicated: Secondary | ICD-10-CM | POA: Diagnosis present

## 2019-08-11 DIAGNOSIS — O0930 Supervision of pregnancy with insufficient antenatal care, unspecified trimester: Secondary | ICD-10-CM

## 2019-08-11 DIAGNOSIS — O99344 Other mental disorders complicating childbirth: Secondary | ICD-10-CM | POA: Diagnosis present

## 2019-08-11 DIAGNOSIS — F4310 Post-traumatic stress disorder, unspecified: Secondary | ICD-10-CM | POA: Diagnosis present

## 2019-08-11 DIAGNOSIS — F17210 Nicotine dependence, cigarettes, uncomplicated: Secondary | ICD-10-CM | POA: Diagnosis present

## 2019-08-11 DIAGNOSIS — Z20822 Contact with and (suspected) exposure to covid-19: Secondary | ICD-10-CM | POA: Diagnosis present

## 2019-08-11 DIAGNOSIS — F319 Bipolar disorder, unspecified: Secondary | ICD-10-CM | POA: Diagnosis present

## 2019-08-11 DIAGNOSIS — Z3A33 33 weeks gestation of pregnancy: Secondary | ICD-10-CM

## 2019-08-11 DIAGNOSIS — O42913 Preterm premature rupture of membranes, unspecified as to length of time between rupture and onset of labor, third trimester: Secondary | ICD-10-CM | POA: Diagnosis present

## 2019-08-11 DIAGNOSIS — B182 Chronic viral hepatitis C: Secondary | ICD-10-CM | POA: Diagnosis present

## 2019-08-11 DIAGNOSIS — O99334 Smoking (tobacco) complicating childbirth: Secondary | ICD-10-CM | POA: Diagnosis present

## 2019-08-11 DIAGNOSIS — O4292 Full-term premature rupture of membranes, unspecified as to length of time between rupture and onset of labor: Secondary | ICD-10-CM | POA: Diagnosis present

## 2019-08-11 DIAGNOSIS — O4290 Premature rupture of membranes, unspecified as to length of time between rupture and onset of labor, unspecified weeks of gestation: Secondary | ICD-10-CM | POA: Diagnosis present

## 2019-08-11 DIAGNOSIS — O99824 Streptococcus B carrier state complicating childbirth: Secondary | ICD-10-CM | POA: Diagnosis present

## 2019-08-11 DIAGNOSIS — O9842 Viral hepatitis complicating childbirth: Secondary | ICD-10-CM | POA: Diagnosis present

## 2019-08-11 DIAGNOSIS — O9902 Anemia complicating childbirth: Secondary | ICD-10-CM | POA: Diagnosis present

## 2019-08-11 DIAGNOSIS — J45909 Unspecified asthma, uncomplicated: Secondary | ICD-10-CM | POA: Diagnosis present

## 2019-08-11 DIAGNOSIS — O99324 Drug use complicating childbirth: Secondary | ICD-10-CM | POA: Diagnosis present

## 2019-08-11 LAB — URINE DRUG SCREEN, QUALITATIVE (ARMC ONLY)
Amphetamines, Ur Screen: NOT DETECTED
Barbiturates, Ur Screen: NOT DETECTED
Benzodiazepine, Ur Scrn: POSITIVE — AB
Cannabinoid 50 Ng, Ur ~~LOC~~: NOT DETECTED
Cocaine Metabolite,Ur ~~LOC~~: POSITIVE — AB
MDMA (Ecstasy)Ur Screen: NOT DETECTED
Methadone Scn, Ur: NOT DETECTED
Opiate, Ur Screen: POSITIVE — AB
Phencyclidine (PCP) Ur S: NOT DETECTED
Tricyclic, Ur Screen: NOT DETECTED

## 2019-08-11 LAB — URINALYSIS, COMPLETE (UACMP) WITH MICROSCOPIC
Bacteria, UA: NONE SEEN
Bilirubin Urine: NEGATIVE
Glucose, UA: NEGATIVE mg/dL
Ketones, ur: NEGATIVE mg/dL
Nitrite: NEGATIVE
Protein, ur: 100 mg/dL — AB
RBC / HPF: >50 RBC/hpf — ABNORMAL HIGH (ref 0–5)
Specific Gravity, Urine: 1.026 (ref 1.005–1.030)
pH: 6 (ref 5.0–8.0)

## 2019-08-11 LAB — CBC
HCT: 30.4 % — ABNORMAL LOW (ref 36.0–46.0)
Hemoglobin: 9.5 g/dL — ABNORMAL LOW (ref 12.0–15.0)
MCH: 24.4 pg — ABNORMAL LOW (ref 26.0–34.0)
MCHC: 31.3 g/dL (ref 30.0–36.0)
MCV: 77.9 fL — ABNORMAL LOW (ref 80.0–100.0)
Platelets: 216 10*3/uL (ref 150–400)
RBC: 3.9 MIL/uL (ref 3.87–5.11)
RDW: 15.1 % (ref 11.5–15.5)
WBC: 7.8 10*3/uL (ref 4.0–10.5)
nRBC: 0 % (ref 0.0–0.2)

## 2019-08-11 LAB — COMPREHENSIVE METABOLIC PANEL
ALT: 18 U/L (ref 0–44)
AST: 26 U/L (ref 15–41)
Albumin: 2.4 g/dL — ABNORMAL LOW (ref 3.5–5.0)
Alkaline Phosphatase: 210 U/L — ABNORMAL HIGH (ref 38–126)
Anion gap: 9 (ref 5–15)
BUN: 14 mg/dL (ref 6–20)
CO2: 23 mmol/L (ref 22–32)
Calcium: 8.4 mg/dL — ABNORMAL LOW (ref 8.9–10.3)
Chloride: 103 mmol/L (ref 98–111)
Creatinine, Ser: 0.71 mg/dL (ref 0.44–1.00)
GFR calc Af Amer: >60 mL/min (ref >60)
GFR calc non Af Amer: >60 mL/min (ref >60)
Glucose, Bld: 94 mg/dL (ref 70–99)
Potassium: 4.2 mmol/L (ref 3.5–5.1)
Sodium: 135 mmol/L (ref 135–145)
Total Bilirubin: 0.6 mg/dL (ref 0.3–1.2)
Total Protein: 6.2 g/dL — ABNORMAL LOW (ref 6.5–8.1)

## 2019-08-11 LAB — ABO/RH: ABO/RH(D): B POS

## 2019-08-11 LAB — RUPTURE OF MEMBRANE (ROM)PLUS: Rom Plus: POSITIVE

## 2019-08-11 LAB — TYPE AND SCREEN
ABO/RH(D): B POS
Antibody Screen: NEGATIVE

## 2019-08-11 LAB — RAPID HIV SCREEN (HIV 1/2 AB+AG)
HIV 1/2 Antibodies: NONREACTIVE
HIV-1 P24 Antigen - HIV24: NONREACTIVE

## 2019-08-11 LAB — SARS CORONAVIRUS 2 BY RT PCR (HOSPITAL ORDER, PERFORMED IN ~~LOC~~ HOSPITAL LAB): SARS Coronavirus 2: NEGATIVE

## 2019-08-11 MED ORDER — TRAMADOL HCL 50 MG PO TABS
50.0000 mg | ORAL_TABLET | Freq: Two times a day (BID) | ORAL | Status: DC
  Administered 2019-08-11 – 2019-08-12 (×2): 50 mg via ORAL
  Filled 2019-08-11 (×2): qty 1

## 2019-08-11 MED ORDER — ACETAMINOPHEN 500 MG PO TABS: 1000.0000 mg | ORAL_TABLET | Freq: Four times a day (QID) | ORAL | Status: DC | PRN

## 2019-08-11 MED ORDER — HALOPERIDOL LACTATE 5 MG/ML IJ SOLN
5.0000 mg | Freq: Three times a day (TID) | INTRAMUSCULAR | Status: DC | PRN
  Filled 2019-08-11: qty 1

## 2019-08-11 MED ORDER — ONDANSETRON 4 MG PO TBDP: 4.0000 mg | ORAL_TABLET | Freq: Four times a day (QID) | ORAL | Status: DC | PRN

## 2019-08-11 MED ORDER — KETOROLAC TROMETHAMINE 30 MG/ML IJ SOLN
30.0000 mg | Freq: Three times a day (TID) | INTRAMUSCULAR | Status: AC
  Administered 2019-08-11: 30 mg via INTRAVENOUS

## 2019-08-11 MED ORDER — ALBUTEROL SULFATE (2.5 MG/3ML) 0.083% IN NEBU: 3.0000 mL | INHALATION_SOLUTION | Freq: Four times a day (QID) | RESPIRATORY_TRACT | Status: DC | PRN

## 2019-08-11 MED ORDER — OXYTOCIN BOLUS FROM INFUSION
333.0000 mL | Freq: Once | INTRAVENOUS | Status: AC
  Administered 2019-08-11: 300 mL/h via INTRAVENOUS

## 2019-08-11 MED ORDER — SIMETHICONE 80 MG PO CHEW: 80.0000 mg | CHEWABLE_TABLET | ORAL | Status: DC | PRN

## 2019-08-11 MED ORDER — DICYCLOMINE HCL 20 MG PO TABS
20.0000 mg | ORAL_TABLET | Freq: Four times a day (QID) | ORAL | Status: DC | PRN
  Filled 2019-08-11: qty 1

## 2019-08-11 MED ORDER — DIPHENHYDRAMINE HCL 25 MG PO CAPS: 25.0000 mg | ORAL_CAPSULE | Freq: Four times a day (QID) | ORAL | Status: DC | PRN

## 2019-08-11 MED ORDER — OXYTOCIN 10 UNIT/ML IJ SOLN
INTRAMUSCULAR | Status: AC
  Filled 2019-08-11: qty 2

## 2019-08-11 MED ORDER — CLONIDINE HCL 0.1 MG PO TABS
0.1000 mg | ORAL_TABLET | ORAL | Status: DC
  Filled 2019-08-11: qty 1

## 2019-08-11 MED ORDER — WITCH HAZEL-GLYCERIN EX PADS: 1.0000 "application " | MEDICATED_PAD | CUTANEOUS | Status: DC

## 2019-08-11 MED ORDER — METHOCARBAMOL 500 MG PO TABS
500.0000 mg | ORAL_TABLET | Freq: Three times a day (TID) | ORAL | Status: DC | PRN
  Filled 2019-08-11: qty 1

## 2019-08-11 MED ORDER — HALOPERIDOL 5 MG PO TABS
5.0000 mg | ORAL_TABLET | Freq: Three times a day (TID) | ORAL | Status: DC | PRN
  Filled 2019-08-11: qty 1

## 2019-08-11 MED ORDER — GABAPENTIN 300 MG PO CAPS
300.0000 mg | ORAL_CAPSULE | Freq: Three times a day (TID) | ORAL | Status: DC
  Administered 2019-08-11 – 2019-08-12 (×2): 300 mg via ORAL
  Filled 2019-08-11 (×2): qty 1

## 2019-08-11 MED ORDER — ONDANSETRON HCL 4 MG/2ML IJ SOLN: 4.0000 mg | INTRAMUSCULAR | Status: DC | PRN

## 2019-08-11 MED ORDER — LACTATED RINGERS IV SOLN: INTRAVENOUS | Status: DC

## 2019-08-11 MED ORDER — SOD CITRATE-CITRIC ACID 500-334 MG/5ML PO SOLN: 30.0000 mL | ORAL | Status: DC | PRN

## 2019-08-11 MED ORDER — FENTANYL CITRATE (PF) 100 MCG/2ML IJ SOLN
INTRAMUSCULAR | Status: AC
  Filled 2019-08-11: qty 2

## 2019-08-11 MED ORDER — HYDROXYZINE HCL 25 MG PO TABS
25.0000 mg | ORAL_TABLET | Freq: Four times a day (QID) | ORAL | Status: DC | PRN
  Filled 2019-08-11: qty 1

## 2019-08-11 MED ORDER — ACETAMINOPHEN 500 MG PO TABS
1000.0000 mg | ORAL_TABLET | Freq: Four times a day (QID) | ORAL | Status: DC | PRN
  Administered 2019-08-11: 1000 mg via ORAL
  Filled 2019-08-11: qty 2

## 2019-08-11 MED ORDER — NAPROXEN 500 MG PO TABS
500.0000 mg | ORAL_TABLET | Freq: Two times a day (BID) | ORAL | Status: DC | PRN
  Filled 2019-08-11: qty 1

## 2019-08-11 MED ORDER — CLONIDINE HCL 0.1 MG PO TABS
0.1000 mg | ORAL_TABLET | Freq: Four times a day (QID) | ORAL | Status: DC
  Administered 2019-08-11 – 2019-08-12 (×3): 0.1 mg via ORAL
  Filled 2019-08-11 (×6): qty 1

## 2019-08-11 MED ORDER — LORAZEPAM 2 MG PO TABS: 2.0000 mg | ORAL_TABLET | ORAL | Status: DC | PRN

## 2019-08-11 MED ORDER — BENZOCAINE-MENTHOL 20-0.5 % EX AERO: 1.0000 "application " | INHALATION_SPRAY | CUTANEOUS | Status: DC | PRN

## 2019-08-11 MED ORDER — BUTORPHANOL TARTRATE 1 MG/ML IJ SOLN: 1.0000 mg | INTRAMUSCULAR | Status: DC | PRN

## 2019-08-11 MED ORDER — CALCIUM CARBONATE ANTACID 500 MG PO CHEW: 2.0000 | CHEWABLE_TABLET | ORAL | Status: DC | PRN

## 2019-08-11 MED ORDER — BETAMETHASONE SOD PHOS & ACET 6 (3-3) MG/ML IJ SUSP
INTRAMUSCULAR | Status: AC
  Administered 2019-08-11: 12 mg via INTRAMUSCULAR
  Filled 2019-08-11: qty 5

## 2019-08-11 MED ORDER — AMMONIA AROMATIC IN INHA
RESPIRATORY_TRACT | Status: AC
  Filled 2019-08-11: qty 10

## 2019-08-11 MED ORDER — LORAZEPAM 2 MG/ML IJ SOLN: 2.0000 mg | INTRAMUSCULAR | Status: DC | PRN

## 2019-08-11 MED ORDER — KETOROLAC TROMETHAMINE 30 MG/ML IJ SOLN
INTRAMUSCULAR | Status: AC
  Filled 2019-08-11: qty 1

## 2019-08-11 MED ORDER — NICOTINE 14 MG/24HR TD PT24
14.0000 mg | MEDICATED_PATCH | Freq: Every day | TRANSDERMAL | Status: DC | PRN
  Filled 2019-08-11: qty 1

## 2019-08-11 MED ORDER — ONDANSETRON HCL 4 MG PO TABS: 8.0000 mg | ORAL_TABLET | Freq: Three times a day (TID) | ORAL | Status: DC | PRN

## 2019-08-11 MED ORDER — ONDANSETRON HCL 4 MG PO TABS: 4.0000 mg | ORAL_TABLET | ORAL | Status: DC | PRN

## 2019-08-11 MED ORDER — CLONIDINE HCL 0.1 MG PO TABS
0.1000 mg | ORAL_TABLET | Freq: Every day | ORAL | Status: DC
  Filled 2019-08-11: qty 1

## 2019-08-11 MED ORDER — ONDANSETRON HCL 4 MG/2ML IJ SOLN: 4.0000 mg | Freq: Four times a day (QID) | INTRAMUSCULAR | Status: DC | PRN

## 2019-08-11 MED ORDER — HYDROXYZINE HCL 25 MG PO TABS: 50.0000 mg | ORAL_TABLET | Freq: Four times a day (QID) | ORAL | Status: DC | PRN

## 2019-08-11 MED ORDER — BENZOCAINE-MENTHOL 20-0.5 % EX AERO
INHALATION_SPRAY | CUTANEOUS | Status: AC
  Filled 2019-08-11: qty 56

## 2019-08-11 MED ORDER — LIDOCAINE HCL (PF) 1 % IJ SOLN
INTRAMUSCULAR | Status: AC
  Filled 2019-08-11: qty 30

## 2019-08-11 MED ORDER — OXYTOCIN-SODIUM CHLORIDE 30-0.9 UT/500ML-% IV SOLN: 2.5000 [IU]/h | INTRAVENOUS | Status: DC

## 2019-08-11 MED ORDER — IBUPROFEN 600 MG PO TABS
600.0000 mg | ORAL_TABLET | Freq: Four times a day (QID) | ORAL | Status: DC
  Administered 2019-08-12: 600 mg via ORAL
  Filled 2019-08-11: qty 1

## 2019-08-11 MED ORDER — COCONUT OIL OIL: 1.0000 | TOPICAL_OIL | Status: DC | PRN

## 2019-08-11 MED ORDER — FENTANYL CITRATE (PF) 100 MCG/2ML IJ SOLN
100.0000 ug | INTRAMUSCULAR | Status: DC | PRN
  Administered 2019-08-11: 100 ug via INTRAVENOUS

## 2019-08-11 MED ORDER — LACTATED RINGERS IV SOLN: 500.0000 mL | INTRAVENOUS | Status: DC | PRN

## 2019-08-11 MED ORDER — PRENATAL MULTIVITAMIN CH: 1.0000 | ORAL_TABLET | Freq: Every day | ORAL | Status: DC

## 2019-08-11 MED ORDER — DOCUSATE SODIUM 100 MG PO CAPS
100.0000 mg | ORAL_CAPSULE | Freq: Two times a day (BID) | ORAL | Status: DC
Start: 2019-08-11 — End: 2019-08-12
  Administered 2019-08-11 – 2019-08-12 (×2): 100 mg via ORAL
  Filled 2019-08-11 (×2): qty 1

## 2019-08-11 MED ORDER — LIDOCAINE HCL (PF) 1 % IJ SOLN: 30.0000 mL | INTRAMUSCULAR | Status: DC | PRN

## 2019-08-11 MED ORDER — FENTANYL 2.5 MCG/ML W/ROPIVACAINE 0.15% IN NS 100 ML EPIDURAL (ARMC)
EPIDURAL | Status: AC
  Filled 2019-08-11: qty 100

## 2019-08-11 MED ORDER — ACETAMINOPHEN 325 MG PO TABS: 650.0000 mg | ORAL_TABLET | ORAL | Status: DC | PRN

## 2019-08-11 MED ORDER — BETAMETHASONE SOD PHOS & ACET 6 (3-3) MG/ML IJ SUSP
12.0000 mg | INTRAMUSCULAR | Status: DC
  Filled 2019-08-11: qty 2

## 2019-08-11 MED ORDER — LOPERAMIDE HCL 2 MG PO CAPS
2.0000 mg | ORAL_CAPSULE | ORAL | Status: DC | PRN
  Filled 2019-08-11: qty 2

## 2019-08-11 MED ORDER — OXYTOCIN-SODIUM CHLORIDE 30-0.9 UT/500ML-% IV SOLN
INTRAVENOUS | Status: AC
  Filled 2019-08-11: qty 500

## 2019-08-11 MED ORDER — MISOPROSTOL 200 MCG PO TABS
ORAL_TABLET | ORAL | Status: AC
  Filled 2019-08-11: qty 4

## 2019-08-11 MED ORDER — DIBUCAINE (PERIANAL) 1 % EX OINT: 1.0000 "application " | TOPICAL_OINTMENT | CUTANEOUS | Status: DC | PRN

## 2019-08-11 MED ORDER — PRENATAL MULTIVITAMIN CH
1.0000 | ORAL_TABLET | Freq: Every day | ORAL | Status: DC
  Administered 2019-08-12: 1 via ORAL
  Filled 2019-08-11: qty 1

## 2019-08-11 NOTE — Progress Notes (Signed)
RN spoke to anesthesia Suzan Slick, MDA) regarding patient arrival, gestational age, and drug use in pregnancy. MDA notified of UDS and patient wanting epidural as labor progresses.

## 2019-08-11 NOTE — OB Triage Note (Signed)
Pt arrived in ED with complaints of water breaking at 0950- clear fluid and cramping that started right after. Pt denies bleeding, and states baby is moving well. Pt has vomited twice since arriving each fluid and stats she has been vomiting for days. Pt states she has had no prenatal care, doesn't know her LMP or how far along she is. Pt states she gets nauseaed every time she eats. Pt states took opioids yesterday and smokes 1/2 pack per day. Denies any other substance use or alochol use.

## 2019-08-11 NOTE — Progress Notes (Signed)
RN called and spoke to [facility] in "TTS" for a psych consult. RN also called Fredna Dow, the provider on call, for a psych consult Tami Lin, CNM put in for the patient. Fredna Dow spoke to CNM regarding the plan of care for the night and stated she could consult on the patient when she was back tomorrow. CNM and RN reviewed the plan of care and RN reported the plan with the patient. The patient verbalized understanding.

## 2019-08-11 NOTE — Progress Notes (Signed)
RN spoke to patient regarding care/history. Patient states she was diagnosed with Hepatitis C back in 2012. She states she took a medication to clear it up (3 pills a day for 3 months). She states she was evaluated for Hepatitis C 8 months ago and was cleared but states she has had IV drug use in the last 8 months (since testing). I spoke to patient about living arrangements at home (spoke to her about a note from several days ago reporting she was homeless). Patient states she is not homeless and lives with both of her parents. She states that her brother has legal custody of her kids but that they are with her parents a lot (she states brother has legal custody due to age of parents) and that her brother and her parents are very involved with her kids. RN discussed that social work would likely become involved in her care with her history and she stated that she understood and that her parents would be involved in her recovery/care of newborn. CNM and NICU team aware of conversation RN had with patient. Order entered for a social work consult with patient.

## 2019-08-11 NOTE — Discharge Summary (Addendum)
Obstetrical Discharge Summary  Patient Name: Kathy [name] DOB: 14-May-1985 MRN: [id number]  Date of Admission: 08/11/2019 Date of Delivery: 08/11/2019  Delivered by: Kathy [name], CNM  Date of Discharge: 08/12/2019  Primary OB: No established prenatal care site  LMP:No LMP recorded (lmp unknown). EDC Estimated Date of Delivery: 09/24/19 Gestational Age at Delivery: [redacted]w[redacted]d  Antepartum complications:  1. No prenatal care - dated [place] 15 week [place] [facility] 2. pPROM and PTL at [id number] current pregnancy 3. Polysubstance use - UDS positive for opioids, benzodiazapine, cocaine, and amphetamines  4. Anemia 5. Multiple mental health diagnoses - Bipolar disorder, depression, anxiety, PTSD 6. Fibromyalgia  7. History of abnormal pap smears with limited follow ups - 2015 ASC-H, no f/u; 02/2015 Colpo ASCUS, HPV neg; 07/2015 repeat colpo - results not available 8. RPR reactive - T. Pallidium Ab result pending    Admitting Diagnosis: pPROM, PTL Secondary Diagnosis: Patient Active Problem List   Diagnosis Date Noted  . Preterm premature rupture of membranes in third trimester 08/11/2019  . Opiate overdose ([facility]  . Polysubstance dependence including opioid type drug, continuous use (HCFleming[place]10/16/2018  . Severe episode of recurrent major depressive disorder, without psychotic features (HC[facility] Court[facility]10/12/2016  . History of 3 spontaneous abortions 03/24/2016  . Eczema 03/24/2016  . Depression 03/24/2016  . Asthma without status asthmaticus without complication 0240/98/1191. Opiate abuse, continuous (HC[facility]ar[facility]02/08/2016  . Fibromyalgia 04/17/2015  . Chronic migraine 07/23/2014  . Pap smear of cervix with ASCUS, cannot exclude HGSIL 06/08/2013  . Bipolar disorder (HCDav[place]03/26/2015  . Generalized anxiety disorder 05/10/2013  . Polysubstance abuse (HCCecilia08/19/2014    Augmentation: N/A Complications: GBS pos - not adequately treated Intrapartum complications/course: Presented to L&D for pPROM and PTL.  No prenatal  care, dated by 1567w2d [place] ED.  Unable to initiated abx d/t difficulty obtaining IV prior to birth.   Delivery Type: spontaneous vaginal delivery Anesthesia: None Placenta: spontaneous Laceration: Multiple abrasions, hemostatic, not repaired  Episiotomy: none Newborn Data: Live born female  Birth Weight: 5 lb 3.6 oz (2370 g) APGAR: 8, 9  Newborn Delivery   Birth date/time: 08/11/2019 14:55:00 Delivery type: Vaginal, Spontaneous      Postpartum Procedures: none Edinburgh:  Edinburgh Postnatal Depression Scale Screening Tool 08/12/2019  I have been able to laugh and see the funny side of things. (No Data)     Post partum course:  Discharge planning and psychiatry consulted for postpartum care d/t no prenatal care in pregnancy, history of multiple mental health disorders, and polysubstance use.  Detox protocol was initiated after delivery.   Patient had an uncomplicated postpartum course.  By time of discharge on PPD#1, her pain was controlled on oral pain medications; she had appropriate lochia and was ambulating, voiding without difficulty and tolerating regular diet. She was evaluated by psychiatry and social work.   Her parents have set up a plan for inpatient substance use treatment program.  Will discharge home with parents, pending plan for them to have custody of infant - they have custody of her other 3 children.    Reviewed positive RPR result.  Confirmatory test is pending.  Discussed the importance of follow up if test is positive and complications related to untreated syphilous.  Also discussed that a reactive RPR test needs to be confirmed to determine if a syphilis infection is present.  She will be at her parent's house this week and states that her parent's phone number can be called to get  a hold of her.  She also confirmed that health information and test results can be shared with parents.  Request and authorization for disclosure of health information for her parents was  signed.  She was deemed stable for discharge to home with parents.     Discharge Physical Exam:  BP 117/89 (BP Location: Left Arm)   Pulse 60   Temp 97.8 F (36.6 C)   Resp 18   Ht _0  (1.651 m)   Wt 65.8 kg   LMP  (LMP Unknown) Comment: mother reports pt is currently 3 months pregnant as of 04/19/19  SpO2 95%   Breastfeeding Unknown   BMI 24.13 kg/m   General: NAD CV: RRR Pulm: CTABL, nl effort ABD: s/nd/nt, fundus firm and below the umbilicus Lochia: moderate Perineum: small abrasions hemostatic and approximated  DVT Evaluation: LE non-ttp, no evidence of DVT on exam.  Hemoglobin  Date Value Ref Range Status  08/11/2019 9.5 (L) 12.0 - 15.0 g/dL Final   HGB  Date Value Ref Range Status  01/07/2012 13.2 12.0 - 16.0 g/dL Final   HCT  Date Value Ref Range Status  08/11/2019 30.4 (L) 36 - 46 % Final  01/07/2012 39.3 35.0 - 47.0 % Final     Disposition: stable, discharge to home. Baby Feeding: formula Baby Disposition: SCN care for prematurity   Rh Immune globulin given: B pos Rubella vaccine given: declined - rubella status pending Varivax vaccine given: declined - varicella status pending  Flu vaccine given in AP or PP setting: N/A Tdap vaccine given in AP or PP setting: N/A  Contraception: Depo provera - given prior to discharge   Prenatal Labs:  Blood type/Rh B pos  Antibody screen neg  Rubella Pending   Varicella Pending   RPR Reactive, T. Pallidum ab pending   HBsAg Pending   HIV NR  GC Neg  Chlamydia Neg  Genetic screening Not done   1 hour GTT Not done   3 hour GTT Not done   GBS Positive    Plan:  Kathy [name] was discharged to home in good condition. Follow-up appointment with delivering provider in 4-6 weeks.  Discharge Medications: Allergies as of 08/12/2019   No Known Allergies     Medication List    STOP taking these medications   buprenorphine-naloxone 8-2 mg Subl SL tablet Commonly known as: SUBOXONE   cefUROXime 250 MG  tablet Commonly known as: CEFTIN   diazepam 10 MG tablet Commonly known as: VALIUM   metroNIDAZOLE 250 MG tablet Commonly known as: FLAGYL   nicotine polacrilex 2 MG gum Commonly known as: NICORETTE   rOPINIRole 1 MG tablet Commonly known as: REQUIP     TAKE these medications   acetaminophen 500 MG tablet Commonly known as: TYLENOL Take 2 tablets (1,000 mg total) by mouth every 6 (six) hours as needed (for pain scale < 4).   albuterol 108 (90 Base) MCG/ACT inhaler Commonly known as: VENTOLIN HFA Inhale 2 puffs into the lungs every 6 (six) hours as needed for wheezing or shortness of breath.   famotidine 20 MG tablet Commonly known as: PEPCID Take 1 tablet (20 mg total) by mouth daily. Start taking on: August 13, 2019   ferrous sulfate 325 (65 FE) MG EC tablet Take 1 tablet (325 mg total) by mouth 2 (two) times daily.   gabapentin 300 MG capsule Commonly known as: NEURONTIN Take 300 mg by mouth 3 (three) times daily.   hydrOXYzine 50 MG tablet Commonly known  as: ATARAX/VISTARIL Take 1 tablet (50 mg) by mouth four times daily as needed: For anxiety   ibuprofen 600 MG tablet Commonly known as: ADVIL Take 1 tablet (600 mg total) by mouth every 6 (six) hours as needed.   naloxone 4 MG/0.1ML Liqd nasal spray kit Commonly known as: NARCAN Use for overdose of heroin/opioids.        Follow-up Information    Minda Meo, CNM Follow up in 4 week(s).   Specialty: Certified Nurse Midwife Why: postpartum exam (next 4-6 weeks) Contact information: [place] [address] [phone]               Signed:  Drinda [name], CNM Certified Nurse Midwife [facility]   I agree with the plan of care and information in this note. Benjaman Kindler

## 2019-08-11 NOTE — H&P (Signed)
OB History & Physical   History of Present Illness:  Chief Complaint:   HPI:  Kathy [name] is a 34 y.o. [phone] female at [redacted]w[redacted]d dated by [place] at [redacted]w[redacted]d.  She presents to L&D for pPROM and contractions   Active FM Unsure of when contractions started, became worse after LOF pPROM @ 0950 08/11/2019 Denies vaginal bleeding   Pregnancy Issues: 1. No prenatal care - dated by 15 week [place] in ED 2. pPROM and PTL at [redacted]w[redacted]d with current pregnancy 3. Polysubstance use - UDS positive for opioids, benzodiazapine, cocaine, and amphetamines  4. Anemia 5. Multiple mental health diagnoses - Bipolar disorder, depression, anxiety, PTSD 6. Fibromyalgia  7. History of abnormal pap smears with limited follow ups - 2015 ASC-H, no f/u; 02/2015 Colpo ASCUS, HPV neg; 07/2015 repeat colpo - results not available   Patient Active Problem List   Diagnosis Date Noted  . Preterm premature rupture of membranes in third trimester 08/11/2019  . Opiate overdose (HCC)   . Polysubstance dependence including opioid type drug, continuous use (HCC) 11/30/2016  . Severe episode of recurrent major depressive disorder, without psychotic features (HCC) 11/25/2016  . History of 3 spontaneous abortions 03/24/2016  . Eczema 03/24/2016  . Depression 03/24/2016  . Asthma without status asthmaticus without complication 03/24/2016  . Opiate abuse, continuous (HCC) 03/24/2016  . Fibromyalgia 04/17/2015  . Chronic migraine 07/23/2014  . Pap smear of cervix with ASCUS, cannot exclude HGSIL 06/08/2013  . Bipolar disorder (HCC) 05/10/2013  . Generalized anxiety disorder 05/10/2013  . Polysubstance abuse (HCC) 10/03/2012     Maternal Medical History:   Past Medical History:  Diagnosis Date  . Anxiety   . Asthma   . Bipolar disorder (HCC)   . Chronic hepatitis C virus infection (HCC) 02/28/2015   Overview:  Diagnosed 12/2014 GT FibroSure 0.03, F 0 fibrosis Tx. Naive  Risks-previous substance use  . Compression fracture of spine  (HCC)   . Fibromyalgia   . Headache   . PTSD (post-traumatic stress disorder)   . Seizures (HCC)    Related to benzo withdrawal  . Seizures (HCC)     Past Surgical History:  Procedure Laterality Date  . HAND SURGERY Right   . HAND SURGERY      No Known Allergies  Prior to Admission medications   Medication Sig Start Date End Date Taking? Authorizing Provider  albuterol (VENTOLIN HFA) 108 (90 Base) MCG/ACT inhaler Inhale 2 puffs into the lungs every 6 (six) hours as needed for wheezing or shortness of breath. 03/28/19  Yes Phineas Semen, MD  buprenorphine-naloxone (SUBOXONE) 8-2 mg SUBL SL tablet Place 1 tablet under the tongue daily.   Yes [provider]  cefUROXime (CEFTIN) 250 MG tablet Take 250 mg by mouth 2 (two) times daily with a meal.   Yes [provider]  gabapentin (NEURONTIN) 300 MG capsule Take 300 mg by mouth 3 (three) times daily.    Yes [provider]  hydrOXYzine (ATARAX/VISTARIL) 50 MG tablet Take 1 tablet (50 mg) by mouth four times daily as needed: For anxiety 04/04/17  Yes Nwoko, Nicole Kindred I, NP  diazepam (VALIUM) 10 MG tablet Take 20 mg by mouth 2 (two) times daily. Patient not taking: Reported on 08/09/2019 09/26/18   [provider]  ferrous sulfate 325 (65 FE) MG EC tablet Take 1 tablet (325 mg total) by mouth 2 (two) times daily. Patient not taking: Reported on 08/11/2019 07/30/19 08/29/19  Concha Se, MD  metroNIDAZOLE (FLAGYL) 250 MG  tablet Take 250 mg by mouth 3 (three) times daily.    [provider]  naloxone [facility](NARCAN) nasal spray 4 mg/0.1 mL Use for overdose of heroin/opioids. 12/25/18   Concha SeFunke, Mary E, MD  nicotine polacrilex (NICORETTE) 2 MG gum Take 1 each (2 mg total) by mouth as needed for smoking cessation. (May purchase from over the counter): For smoking cessation Patient not taking: Reported on 08/11/2019 04/04/17   Armandina StammerNwoko, Agnes I, NP  rOPINIRole (REQUIP) 1 MG tablet Take 1 mg by mouth at bedtime. Patient  not taking: Reported on 08/09/2019    [provider]     Prenatal care site:  No prenatal care   Social History: She  reports that she has been smoking. She has been smoking about 0.50 packs per day. She has never used smokeless tobacco. She reports previous alcohol use. She reports current drug use. Drugs: Cocaine, Heroin, Hydrocodone, and Benzodiazepines.  Family History: family history is not on file.   Review of Systems: A full review of systems was performed and negative except as noted in the HPI.     Physical Exam:  Vital Signs: BP 128/70   Pulse 77   Temp 97.6 F (36.4 C) (Oral)   Resp 14   Ht 5\' 5"  (1.651 m)   Wt 65.8 kg   LMP  (LMP Unknown) Comment: mother reports pt is currently 3 months pregnant as of 04/19/19  Breastfeeding Unknown   BMI 24.13 kg/m   General: diaphoretic, ill-appearing, tearful  HEENT: normocephalic, atraumatic Heart: regular rate & rhythm.  No murmurs/rubs/gallops Lungs: clear to auscultation bilaterally, normal respiratory effort Abdomen: soft, gravid, non-tender;  EFW: 4 1/2lbs  Pelvic:              External: Normal external female genitalia             Cervix: 4-5/90/-2 on admission             Membranes: grossly ruptured with large amount of clear fluid pooling in vaginal canal               Extremities: non-tender, symmetric, 2+ edema bilaterally.  DTRs: 2+/2+   Neurologic: Alert & oriented x 3.    Results for orders placed or performed during the hospital encounter of 08/11/19 (from the past 24 hour(s))  Urinalysis, Complete w Microscopic     Status: Abnormal   Collection Time: 08/11/19 11:36 AM  Result Value Ref Range   Color, Urine AMBER (A) YELLOW   APPearance HAZY (A) CLEAR   Specific Gravity, Urine 1.026 1.005 - 1.030   pH 6.0 5.0 - 8.0   Glucose, UA NEGATIVE NEGATIVE mg/dL   Hgb urine dipstick MODERATE (A) NEGATIVE   Bilirubin Urine NEGATIVE NEGATIVE   Ketones, ur NEGATIVE NEGATIVE mg/dL   Protein, ur 161100 (A)  NEGATIVE mg/dL   Nitrite NEGATIVE NEGATIVE   Leukocytes,Ua SMALL (A) NEGATIVE   RBC / HPF >50 (H) 0 - 5 RBC/hpf   WBC, UA 6-10 0 - 5 WBC/hpf   Bacteria, UA NONE SEEN NONE SEEN   Squamous Epithelial / LPF 6-10 0 - 5  ROM Plus ([facility] only)     Status: None   Collection Time: 08/11/19 12:24 PM  Result Value Ref Range   Rom Plus POSITIVE   Urine Drug Screen, Qualitative ([facility] only)     Status: Abnormal   Collection Time: 08/11/19 12:24 PM  Result Value Ref Range   Tricyclic, Ur Screen NONE DETECTED NONE DETECTED  Amphetamines, Ur Screen NONE DETECTED NONE DETECTED   MDMA (Ecstasy)Ur Screen NONE DETECTED NONE DETECTED   Cocaine Metabolite,Ur [place] POSITIVE (A) NONE DETECTED   Opiate, Ur Screen POSITIVE (A) NONE DETECTED   Phencyclidine (PCP) Ur S NONE DETECTED NONE DETECTED   Cannabinoid 50 Ng, Ur [place] NONE DETECTED NONE DETECTED   Barbiturates, Ur Screen NONE DETECTED NONE DETECTED   Benzodiazepine, Ur Scrn POSITIVE (A) NONE DETECTED   Methadone Scn, Ur NONE DETECTED NONE DETECTED  SARS Coronavirus 2 by RT PCR (hospital order, performed in [facility] hospital lab) Nasopharyngeal Nasopharyngeal Swab     Status: None   Collection Time: 08/11/19 12:25 PM   Specimen: Nasopharyngeal Swab  Result Value Ref Range   SARS Coronavirus 2 NEGATIVE NEGATIVE  CBC     Status: Abnormal   Collection Time: 08/11/19  2:17 PM  Result Value Ref Range   WBC 7.8 4.0 - 10.5 K/uL   RBC 3.90 3.87 - 5.11 MIL/uL   Hemoglobin 9.5 (L) 12.0 - 15.0 g/dL   HCT 45.8 (L) 36 - 46 %   MCV 77.9 (L) 80.0 - 100.0 fL   MCH 24.4 (L) 26.0 - 34.0 pg   MCHC 31.3 30.0 - 36.0 g/dL   RDW 09.9 83.3 - 82.5 %   Platelets 216 150 - 400 K/uL   nRBC 0.0 0.0 - 0.2 %  Type and screen [facility]     Status: None (Preliminary result)   Collection Time: 08/11/19  4:44 PM  Result Value Ref Range   ABO/RH(D) PENDING    Antibody Screen PENDING    Sample Expiration      08/14/2019,2359 Performed at [facility]  Lab, [address] [place], [place] [address]   Comprehensive metabolic panel     Status: Abnormal   Collection Time: 08/11/19  4:44 PM  Result Value Ref Range   Sodium 135 135 - 145 mmol/L   Potassium 4.2 3.5 - 5.1 mmol/L   Chloride 103 98 - 111 mmol/L   CO2 23 22 - 32 mmol/L   Glucose, Bld 94 70 - 99 mg/dL   BUN 14 6 - 20 mg/dL   Creatinine, Ser 6.73 0.44 - 1.00 mg/dL   Calcium 8.4 (L) 8.9 - 10.3 mg/dL   Total Protein 6.2 (L) 6.5 - 8.1 g/dL   Albumin 2.4 (L) 3.5 - 5.0 g/dL   AST 26 15 - 41 U/L   ALT 18 0 - 44 U/L   Alkaline Phosphatase 210 (H) 38 - 126 U/L   Total Bilirubin 0.6 0.3 - 1.2 mg/dL   GFR calc non Af Amer >60 >60 mL/min   GFR calc Af Amer >60 >60 mL/min   Anion gap 9 5 - 15  Rapid HIV screen (HIV 1/2 Ab+Ag) ([facility] Only)     Status: None   Collection Time: 08/11/19  4:44 PM  Result Value Ref Range   HIV-1 P24 Antigen - HIV24 NON REACTIVE NON REACTIVE   HIV 1/2 Antibodies NON REACTIVE NON REACTIVE   Interpretation (HIV Ag Ab)      A non reactive test result means that HIV 1 or HIV 2 antibodies and HIV 1 p24 antigen were not detected in the specimen.  ABO/Rh     Status: None (Preliminary result)   Collection Time: 08/11/19  4:44 PM  Result Value Ref Range   ABO/RH(D) PENDING     Pertinent Results:  Prenatal Labs: Blood type/Rh B pos   Antibody screen neg  Rubella  Pending   Varicella Pending   RPR Pending   HBsAg Pending   HIV NR  GC Neg  Chlamydia Neg  Genetic screening Not done   1 hour GTT Not done  3 hour GTT Not done  GBS Positive    FHT: 135 bpm, moderate variability, accels present, intermittent variables  TOCO: ctx q 2-5, mild to moderate to palpation SVE:  4-5/90/-1 on admission    Cephalic by [place]  US OB Comp + 14 Wk  Result Date: 08/11/2019 CLINICAL DATA:  No prenatal care.  Substance abuse. EXAM: OBSTETRICAL ULTRASOUND >14 WKS FINDINGS: Number of Fetuses: 1 Heart Rate:  150 bpm Movement: Yes Presentation: Cephalic Previa: No Placental  Location: Fundal and right lateral Amniotic Fluid (Subjective): Within normal limits Amniotic Fluid (Objective): AFI = 11.8 cm (5%ile= 8.3 cm, 95%= 14.3 cm for 33 wks) FETAL BIOMETRY BPD: 8.4cm 33w 5d HC:   30.9cm 34w 4d AC:   29.7cm 33w 5d FL:   6.6cm 34w 1d Current Mean GA: 34w 0d [place] EDC: 09/22/2019 Assigned GA:  33w 5d Assigned EDC: 09/24/2019 Estimated Fetal Weight:  2,305g 49%ile FETAL ANATOMY Lateral Ventricles: Not visualized Thalami/CSP: Appears normal Posterior Fossa:  Not visualized Nuchal Region: Not visualized   NFT= N/A > 20 WKS Upper Lip: Appears normal Spine: Limited views appear normal 4 Chamber Heart on Left: Appears normal LVOT: Not visualized RVOT: Not visualized Stomach on Left: Appears normal 3 Vessel Cord: Not visualized Cord Insertion site: Appears normal Kidneys: Appears normal Bladder: Appears normal Extremities: Appears normal Sex: Female Technically difficult due to: Advanced gestational age and fetal position Maternal Findings: Cervix:  Not visualized IMPRESSION: Assigned GA currently 33 weeks 5 days. Appropriate fetal growth, with EFW currently at 49 %ile. Limited fetal anatomic evaluation due to advanced gestational age and fetal position. Visualized fetal anatomy is unremarkable in appearance. Amniotic fluid volume within normal limits, with AFI of 11.8 cm. Electronically Signed   By: Marlaine Hind M.D.   On: 08/11/2019 13:44   US OB Limited  Result Date: 08/09/2019 CLINICAL DATA:  Altered mental status, substance abuse complicating pregnancy. Approximately 7 months pregnant. Gestational age by first ultrasound 33 weeks 3 days for ultrasound [facility] 09/24/2019 EXAM: LIMITED OBSTETRIC ULTRASOUND FINDINGS: Number of Fetuses: 1 Heart Rate:  125 bpm Movement: Yes Presentation: Cephalic Placental Location: Anterior Previa: No Amniotic Fluid (Subjective):  Within normal limits. BPD: 8.3 cm 33 w  4 d MATERNAL FINDINGS: Cervix:  Not well visualized. Uterus/Adnexae: No abnormality visualized.   Neither ovary was seen. IMPRESSION: Single live intrauterine pregnancy estimated gestational age [redacted] weeks 4 days. There has been appropriate fetal growth from 04/04/2019 ultrasound. No apparent complication. Cervix not well visualized. This exam is performed on an emergent basis and does not comprehensively evaluate fetal size, dating, or anatomy; follow-up complete OB US should be considered if further fetal assessment is warranted. Electronically Signed   By: Keith Rake M.D.   On: 08/09/2019 01:46    Assessment:  Keyasia Jolliff is a 34 y.o. [id number] female at [redacted]w[redacted]d with pPROM and PTL.   Plan:  1. Admit to Labor & Delivery; consents reviewed and obtained -Dr. Leafy Ro notified of admission  2. Fetal Well being  - Fetal Tracing: Cat 2 - overall reassuring with moderate variability  - Group B Streptococcus ppx indicated: GBS pos, will start abx as soon as IV is in place  - Presentation: vertex confirmed by [place]  - Bedside US ordered, c/w [redacted] week gestation   3.  Routine OB: - Prenatal labs ordered, no previous prenatal labs  - CBC, T&S, RPR on admit - Clear fluids, IVF - Betamethasone ordered   4. Monitoring of Labor -  External toco in place -  Pelvis proven to 7lbs  -  Plan for continuous fetal monitoring  -  Maternal pain control as desired; requesting regional anesthesia  - Anticipate vaginal delivery  5. Post Partum Planning: - Infant feeding: formula  - Contraception: TBD - Discharge planning for no prenatal care and polysubstance use - Psych consult for polysubstance use, management of withdrawals, and multiple mental health diagnoses   Gustavo Lah, CNM 08/11/19 5:56 PM

## 2019-08-12 LAB — URINE CULTURE: Culture: NO GROWTH

## 2019-08-12 LAB — HEPATITIS B SURFACE ANTIGEN: Hepatitis B Surface Ag: NONREACTIVE

## 2019-08-12 LAB — RPR
RPR Ser Ql: REACTIVE — AB
RPR Titer: 1:2 {titer}

## 2019-08-12 MED ORDER — MEDROXYPROGESTERONE ACETATE 150 MG/ML IM SUSP
150.0000 mg | Freq: Once | INTRAMUSCULAR | Status: AC
  Administered 2019-08-12: 150 mg via INTRAMUSCULAR
  Filled 2019-08-12: qty 1

## 2019-08-12 MED ORDER — FAMOTIDINE 20 MG PO TABS
20.0000 mg | ORAL_TABLET | Freq: Every day | ORAL | Status: DC
  Administered 2019-08-12: 20 mg via ORAL
  Filled 2019-08-12: qty 1

## 2019-08-12 MED ORDER — FAMOTIDINE 20 MG PO TABS: 20.0000 mg | ORAL_TABLET | Freq: Every day | ORAL | 1 refills | Status: DC

## 2019-08-12 MED ORDER — ACETAMINOPHEN 500 MG PO TABS: 1000.0000 mg | ORAL_TABLET | Freq: Four times a day (QID) | ORAL | 0 refills | Status: DC | PRN

## 2019-08-12 MED ORDER — IBUPROFEN 600 MG PO TABS: 600.0000 mg | ORAL_TABLET | Freq: Four times a day (QID) | ORAL | 3 refills | Status: DC | PRN

## 2019-08-12 NOTE — Progress Notes (Signed)
Nurse called into room because pt was not happy with dining services. She stated that they would not let her order any food. Upon entering room, nurse and nurse tech observed an obvious change in pt's demeanor. She was slurring her words and was not making any kind of sense.   Dining services called and explained that she had ordered 3 breakfast trays already and was trying to order another. Nurse took 2nd tray into room and explained that if another tray was ordered then they would have to pay the $6 charge for a tray. Pt and visitor verbalized understanding.

## 2019-08-12 NOTE — Clinical Social Work Peds Assess (Signed)
°  CLINICAL SOCIAL WORK PEDIATRIC ASSESSMENT NOTE  Patient Details  Name: Mahalie Kanner MRN: [id number] Date of Birth: 01/14/1986  Date:  08/12/2019  Clinical Social Worker Initiating Note:  Patient with a history of past and current substance abuse.  Patient admitted to SA during pregnancy.  Patient is known to [employer] of social services. [facility] DSS/CPS notified [phone] Date/Time: Initiated:  08/12/19/0100     Child's Name:  Girl Clinton Gallant   Biological Parents:  Mother [facility] DSS involvement)   Need for Interpreter:  None   Reason for Referral:      Address:        Phone number:       Household Members:  Parents, Self   Natural Supports (not living in the home):      Professional Supports: None   Employment: Unemployed   Type of Work:     Education:  9 to 11 years   Architect:      Other Resources:      Cultural/Religious Considerations Which May Impact Care:  none  Strengths:      Risk Factors/Current Problems:  Mental Health Concerns , Substance Use    Cognitive State:  Alert    Mood/Affect:  Calm    CSW Assessment: The patient is a 34 year old Caucasian female who gave birth to a baby girl Joeanne Robicheaux at [facility] Aug 15, 1985.  Patient reported that she did not receive prenatal care "because I did not know I was pregnant."  Stated that she realized she was pregnant when she was five months pregnant however she continued to use polysubstance. Stated that she was receiving outpatient treatment with a methadone program in [place] however she stop attending.  Patient reported that she would like to award custody of her child to her mother and father.  She explain that she is the mother of 3 children 14, 86, 60 years old however [facility] DSS awarded custody of her 3 children to her brother.    Mother of patient, Marita Snellen [phone] Father of patient, Trinita Devlin [phone]  CSW Plan/Description:  Other  Information/Referral to [facility] (1. Department of social services in [place] for [place] with Medicaid 2. Establish with primary care/mental health services in [facility].)   [place] DSS on call social worker, [phone], accepted preliminary report.   Latanya Hemmer I Elif Yonts, LCSW 08/12/2019, 3:51 PM

## 2019-08-12 NOTE — Progress Notes (Signed)
Called Dr. Dalbert Garnet and notified her of pt's change in demeanor after boyfriend came back from outside. Explained that pt could no longer make sense with her words and was rambling on about nonsense. Confirmed with Dr. Dalbert Garnet that nurse could still give scheduled medications (gabapentin, clonidine, and tramadol) despite observation of pt.  Dr. Dalbert Garnet was planning to get psych up to see pt ASAP.

## 2019-08-12 NOTE — Progress Notes (Signed)
Patient discharged home. Infant remains in [facility] for care. Cleared by psych and social work to go home. Discharge instructions and prescriptions given and reviewed with patient. Patient verbalized understanding. Parents present at discharge. Confirmed they would assist with scheduling of appointment and ensuring patient had a follow-up postpartum visit.   Request & Authorization For Use/Disclosure of Protected Health Information form signed by patient. Put in chart.    Escorted out by staff.

## 2019-08-12 NOTE — Progress Notes (Signed)
Patient refused labs when lab team came around earlier this morning to draw.   Nurse completed morning assessment on patient and asked if lab could come back and draw them, patient refused again.   Notified lab team.

## 2019-08-12 NOTE — Consult Note (Signed)
[facility] Face-to-Face Psychiatry Consult   Reason for Consult: Polysubstance abuse Referring Physician: Kathrin Ruddy, certified nurse midwife Patient Identification: Bria Sparr MRN:  [id number] Principal Diagnosis: <principal problem not specified> Diagnosis:  Active Problems:   Preterm premature rupture of membranes in third trimester   Total Time spent with patient: 30 minutes  Subjective:   Gregory Dowe is a 34 y.o. female patient admitted with premature rupture of membranes and active contractions.  Psychiatry was consulted at patient's request due to possible withdrawal symptoms and polysubstance use.  Urine drug screen positive for opiates, benzodiazepines, cocaine, and amphetamines.  Patient with mental diagnosis of bipolar disorder, depression, anxiety, posttraumatic stress disorder.  Patient was assessed by Clinical research associate and licensed clinical social worker was present.  Patient states she requested psychiatry to be consulted for possible initiation of methadone while in the hospital for pain control.  She does verbalize understanding if were unable to initiate this medication due to Hospital procedures.  She verbalizes using methadone previously and this tend to work well for her, that she actually completed the program successfully and maintain sobriety for 1 year.  She to states that she does not wish to take Suboxone and or Subutex and methadone is the only thing that worked.  She denies any previous inpatient admissions for psychiatric conditions, however reports multiple substance abuse facility admissions with her most recent being at [facility].  She denies any suicidal ideations, intentions, threats, attempts, or gestures.  She reports a history of nonsuicidal self-injurious behavior, however has not engaged in any cutting behaviors in quite some time.   Patient was seen and case discussed with Dr. Dalbert Garnet (attending OB).  Patient was resting upon entering the room, however promptly set  up and acknowledge Clinical research associate.  Writer introduced self patient and her significant other name Selena Batten who was also lying in the bed with patient.  Patient was irritable, yet agreeably cooperative throughout the assessment.  She was somewhat angry and remain on age throughout.  Patient became even more hostile when discussing substance abuse, and her current speech being slurred.  Patient appears to be under the influence of a substance however she deflected all questions pertaining to such.  Patient did state if we were unable to initiate methadone, she is comfortable discharging home as her father plans to take her to [facility] clinic in [place] to start methadone.  I explored the effects of substances on her mental health with her.  Patient has no motivation to stay sober, she is able to verbalize risks and effects of substances on her newborn child, family dynamics, and custody of all her children going forward.  Patient is not endorsing suicidal ideation, and is able to contract for safety if she leaves the hospital.  Patient has limited insight on the role of substances in her mental health, and she does not seem motivated to engage in any relapse preventative measure at this time.  We will psychiatrically cleared patient.     HPI: See HPI by labor and delivery  Past Psychiatric History: See above  Risk to Self:  No Risk to Others:  No Prior Inpatient Therapy:  Yes Prior Outpatient Therapy:  none currently  Past Medical History:  Past Medical History:  Diagnosis Date  . Anxiety   . Asthma   . Bipolar disorder (HCC)   . Chronic hepatitis C virus infection (HCC) 02/28/2015   Overview:  Diagnosed 12/2014 GT FibroSure 0.03, F 0 fibrosis Tx. Naive  Risks-previous substance use  .  Compression fracture of spine (HCC)   . Fibromyalgia   . Headache   . PTSD (post-traumatic stress disorder)   . Seizures (HCC)    Related to benzo withdrawal  . Seizures (HCC)     Past Surgical History:  Procedure  Laterality Date  . HAND SURGERY Right   . HAND SURGERY     Family History: History reviewed. No pertinent family history. Family Psychiatric  History: Denies Social History:  Social History   Substance and Sexual Activity  Alcohol Use Not Currently   Comment: unknown     Social History   Substance and Sexual Activity  Drug Use Yes  . Types: Cocaine, Heroin, Hydrocodone, Benzodiazepines   Comment: benzodiazpine 5 days ago. Cocaine: last used not sure; Heroin: last used 08/11/2019;     Social History   Socioeconomic History  . Marital status: Single    Spouse name: Not on file  . Number of children: Not on file  . Years of education: Not on file  . Highest education level: Not on file  Occupational History  . Not on file  Tobacco Use  . Smoking status: Current Every Day Smoker    Packs/day: 0.50  . Smokeless tobacco: Never Used  . Tobacco comment: Pt does not want to stop smoking at this time  Vaping Use  . Vaping Use: Never used  Substance and Sexual Activity  . Alcohol use: Not Currently    Comment: unknown  . Drug use: Yes    Types: Cocaine, Heroin, Hydrocodone, Benzodiazepines    Comment: benzodiazpine 5 days ago. Cocaine: last used not sure; Heroin: last used 08/11/2019;   . Sexual activity: Yes    Birth control/protection: Injection, Pill    Comment: Depo Provera  Other Topics Concern  . Not on file  Social History Narrative   ** Merged History Encounter **       ** Merged History Encounter **       Social Determinants of Health   Financial Resource Strain:   . Difficulty of Paying Living Expenses:   Food Insecurity:   . Worried About Programme researcher, broadcasting/film/videounning Out of Food in the Last Year:   . Baristaan Out of Food in the Last Year:   Transportation Needs:   . Freight forwarderLack of Transportation (Medical):   Marland Kitchen. Lack of Transportation (Non-Medical):   Physical Activity:   . Days of Exercise per Week:   . Minutes of Exercise per Session:   Stress:   . Feeling of Stress :   Social  Connections:   . Frequency of Communication with Friends and Family:   . Frequency of Social Gatherings with Friends and Family:   . Attends Religious Services:   . Active Member of Clubs or Organizations:   . Attends BankerClub or Organization Meetings:   Marland Kitchen. Marital Status:    Additional Social History:    Allergies:  No Known Allergies  Labs:  Results for orders placed or performed during the hospital encounter of 08/11/19 (from the past 48 hour(s))  Urinalysis, Complete w Microscopic     Status: Abnormal   Collection Time: 08/11/19 11:36 AM  Result Value Ref Range   Color, Urine AMBER (A) YELLOW    Comment: BIOCHEMICALS MAY BE AFFECTED BY COLOR   APPearance HAZY (A) CLEAR   Specific Gravity, Urine 1.026 1.005 - 1.030   pH 6.0 5.0 - 8.0   Glucose, UA NEGATIVE NEGATIVE mg/dL   Hgb urine dipstick MODERATE (A) NEGATIVE   Bilirubin Urine NEGATIVE NEGATIVE  Ketones, ur NEGATIVE NEGATIVE mg/dL   Protein, ur 242 (A) NEGATIVE mg/dL   Nitrite NEGATIVE NEGATIVE   Leukocytes,Ua SMALL (A) NEGATIVE   RBC / HPF >50 (H) 0 - 5 RBC/hpf   WBC, UA 6-10 0 - 5 WBC/hpf   Bacteria, UA NONE SEEN NONE SEEN   Squamous Epithelial / LPF 6-10 0 - 5    Comment: Performed at [facility], [address] [place], [place] [address]  ROM Plus [facility] only)     Status: None   Collection Time: 08/11/19 12:24 PM  Result Value Ref Range   Rom Plus POSITIVE     Comment: Performed at [facility], [address], [place], [place] [address]  Urine Drug Screen, Qualitative ([facility] only)     Status: Abnormal   Collection Time: 08/11/19 12:24 PM  Result Value Ref Range   Tricyclic, Ur Screen NONE DETECTED NONE DETECTED   Amphetamines, Ur Screen NONE DETECTED NONE DETECTED   MDMA (Ecstasy)Ur Screen NONE DETECTED NONE DETECTED   Cocaine Metabolite,Ur [place] POSITIVE (A) NONE DETECTED   Opiate, Ur Screen POSITIVE (A) NONE DETECTED   Phencyclidine (PCP) Ur S NONE DETECTED NONE DETECTED   Cannabinoid 50 Ng,  Ur [place] NONE DETECTED NONE DETECTED   Barbiturates, Ur Screen NONE DETECTED NONE DETECTED   Benzodiazepine, Ur Scrn POSITIVE (A) NONE DETECTED   Methadone Scn, Ur NONE DETECTED NONE DETECTED    Comment: (NOTE) Tricyclics + metabolites, urine    Cutoff 1000 ng/mL Amphetamines + metabolites, urine  Cutoff 1000 ng/mL MDMA (Ecstasy), urine              Cutoff 500 ng/mL Cocaine Metabolite, urine          Cutoff 300 ng/mL Opiate + metabolites, urine        Cutoff 300 ng/mL Phencyclidine (PCP), urine         Cutoff 25 ng/mL Cannabinoid, urine                 Cutoff 50 ng/mL Barbiturates + metabolites, urine  Cutoff 200 ng/mL Benzodiazepine, urine              Cutoff 200 ng/mL Methadone, urine                   Cutoff 300 ng/mL  The urine drug screen provides only a preliminary, unconfirmed analytical test result and should not be used for non-medical purposes. Clinical consideration and professional judgment should be applied to any positive drug screen result due to possible interfering substances. A more specific alternate chemical method must be used in order to obtain a confirmed analytical result. Gas chromatography / mass spectrometry (GC/MS) is the preferred confirm atory method. Performed at [facility], [address] [place], [place] [address]   SARS Coronavirus 2 by RT PCR (hospital order, performed in [facility] hospital lab) Nasopharyngeal Nasopharyngeal Swab     Status: None   Collection Time: 08/11/19 12:25 PM   Specimen: Nasopharyngeal Swab  Result Value Ref Range   SARS Coronavirus 2 NEGATIVE NEGATIVE    Comment: (NOTE) SARS-CoV-2 target nucleic acids are NOT DETECTED.  The SARS-CoV-2 RNA is generally detectable in upper and lower respiratory specimens during the acute phase of infection. The lowest concentration of SARS-CoV-2 viral copies this assay can detect is 250 copies / mL. A negative result does not preclude SARS-CoV-2 infection and should not be  used as the sole basis for treatment or other patient management decisions.  A negative result may occur with improper specimen collection / handling, submission of specimen other than nasopharyngeal swab, presence of viral mutation(s) within the areas targeted by this assay, and inadequate number of viral copies (<250 copies / mL). A negative result must be combined with clinical observations, patient history, and epidemiological information.  Fact Sheet for Patients:   BoilerBrush.com.cy  Fact Sheet for Healthcare Providers: https://pope.com/  This test is not yet approved or  cleared by the [place] FDA and has been authorized for detection and/or diagnosis of SARS-CoV-2 by FDA under an Emergency Use Authorization (EUA).  This EUA will remain in effect (meaning this test can be used) for the duration of the COVID-19 declaration under Section 564(b)(1) of the Act, 21 U.S.C. section 360bbb-3(b)(1), unless the authorization is terminated or revoked sooner.  Performed at [facility], [address] [place], [place] [address]   ABO/Rh     Status: None   Collection Time: 08/11/19  2:17 PM  Result Value Ref Range   ABO/RH(D)      B POS Performed at [facility], [address] [place], [place] [address]   CBC     Status: Abnormal   Collection Time: 08/11/19  2:17 PM  Result Value Ref Range   WBC 7.8 4.0 - 10.5 K/uL   RBC 3.90 3.87 - 5.11 MIL/uL   Hemoglobin 9.5 (L) 12.0 - 15.0 g/dL   HCT 09.8 (L) 36 - 46 %   MCV 77.9 (L) 80.0 - 100.0 fL   MCH 24.4 (L) 26.0 - 34.0 pg   MCHC 31.3 30.0 - 36.0 g/dL   RDW 11.9 14.7 - 82.9 %   Platelets 216 150 - 400 K/uL   nRBC 0.0 0.0 - 0.2 %    Comment: Performed at [facility], [address] [place], [place] [address]  Type and screen [facility]     Status: None   Collection Time: 08/11/19  4:44 PM  Result Value Ref Range    ABO/RH(D) B POS    Antibody Screen NEG    Sample Expiration      08/14/2019,2359 Performed at [facility], [address] [place], [place] [address]   RPR     Status: Abnormal   Collection Time: 08/11/19  4:44 PM  Result Value Ref Range   RPR Ser Ql Reactive (A) NON REACTIVE    Comment: SENT FOR CONFIRMATION   RPR Titer 1:2     Comment: Performed at [facility], [address] [place], [place] [address]  Comprehensive metabolic panel     Status: Abnormal   Collection Time: 08/11/19  4:44 PM  Result Value Ref Range   Sodium 135 135 - 145 mmol/L   Potassium 4.2 3.5 - 5.1 mmol/L   Chloride 103 98 - 111 mmol/L   CO2 23 22 - 32 mmol/L   Glucose, Bld 94 70 - 99 mg/dL    Comment: Glucose reference range applies only to samples taken after fasting for at least 8 hours.   BUN 14 6 - 20 mg/dL   Creatinine, Ser 2.95 0.44 - 1.00 mg/dL   Calcium 8.4 (L) 8.9 - 10.3 mg/dL   Total Protein 6.2 (L) 6.5 - 8.1 g/dL   Albumin 2.4 (L) 3.5 - 5.0 g/dL   AST 26 15 - 41 U/L   ALT 18 0 - 44 U/L   Alkaline Phosphatase 210 (H) 38 - 126 U/L   Total Bilirubin 0.6  0.3 - 1.2 mg/dL   GFR calc non Af Amer >60 >60 mL/min   GFR calc Af Amer >60 >60 mL/min   Anion gap 9 5 - 15    Comment: Performed at [facility], [address] [place], [place] [address]  Hepatitis B surface antigen     Status: None   Collection Time: 08/11/19  4:44 PM  Result Value Ref Range   Hepatitis B Surface Ag NON REACTIVE NON REACTIVE    Comment: Performed at [facility], [place] [address] [place], [place] [address]  Rapid HIV screen (HIV 1/2 Ab+Ag) ([facility] Only)     Status: None   Collection Time: 08/11/19  4:44 PM  Result Value Ref Range   HIV-1 P24 Antigen - HIV24 NON REACTIVE NON REACTIVE    Comment: (NOTE) Detection of p24 may be inhibited by biotin in the sample, causing false negative results in acute infection.    HIV 1/2 Antibodies NON REACTIVE NON REACTIVE   Interpretation (HIV Ag Ab)       A non reactive test result means that HIV 1 or HIV 2 antibodies and HIV 1 p24 antigen were not detected in the specimen.    Comment: Performed at [facility], [address] [place], [place] [address]    Current Facility-Administered Medications  Medication Dose Route Frequency Provider Last Rate Last Admin  . acetaminophen (TYLENOL) tablet 1,000 mg  1,000 mg Oral Q6H PRN Minda Meo, CNM      . albuterol (PROVENTIL) (2.5 MG/3ML) 0.083% nebulizer solution 3 mL  3 mL Inhalation Q6H PRN Minda Meo, CNM      . benzocaine-Menthol (DERMOPLAST) 20-0.5 % topical spray 1 application  1 application Topical PRN Minda Meo, CNM      . cloNIDine (CATAPRES) tablet 0.1 mg  0.1 mg Oral QID Drinda Butts M, CNM   0.1 mg at 08/12/19 1020   Followed by  . [START ON 08/14/2019] cloNIDine (CATAPRES) tablet 0.1 mg  0.1 mg Oral BH-qamhs Minda Meo, CNM       Followed by  . [START ON 08/16/2019] cloNIDine (CATAPRES) tablet 0.1 mg  0.1 mg Oral QAC breakfast Minda Meo, CNM      . coconut oil  1 application Topical PRN Minda Meo, CNM      . witch hazel-glycerin (TUCKS) pad 1 application  1 application Topical Continuous Minda Meo, CNM       And  . dibucaine (NUPERCAINAL) 1 % rectal ointment 1 application  1 application Rectal PRN Minda Meo, CNM      . dicyclomine (BENTYL) tablet 20 mg  20 mg Oral Q6H PRN Minda Meo, CNM      . diphenhydrAMINE (BENADRYL) capsule 25 mg  25 mg Oral Q6H PRN Minda Meo, CNM      . docusate sodium (COLACE) capsule 100 mg  100 mg Oral BID Drinda Butts M, CNM   100 mg at 08/12/19 1020  . famotidine (PEPCID) tablet 20 mg  20 mg Oral Daily Minda Meo, CNM      . gabapentin (NEURONTIN) capsule 300 mg  300 mg Oral TID Minda Meo, CNM   300 mg at 08/12/19 1020  . haloperidol (HALDOL) tablet 5 mg  5 mg Oral Q8H PRN Minda Meo, CNM       Or  . haloperidol lactate (HALDOL) injection 5 mg  5 mg Intramuscular Q8H PRN Minda Meo,  CNM      . hydrOXYzine (ATARAX/VISTARIL) tablet 25 mg  25 mg Oral Q6H PRN Gustavo Lah, CNM      . hydrOXYzine (ATARAX/VISTARIL) tablet 50 mg  50 mg Oral Q6H PRN Margaretmary Eddy M, CNM      . ibuprofen (ADVIL) tablet 600 mg  600 mg Oral Q6H Margaretmary Eddy M, CNM      . ketorolac (TORADOL) 30 MG/ML injection 30 mg  30 mg Intravenous Q8H Margaretmary Eddy M, CNM   30 mg at 08/11/19 1520  . loperamide (IMODIUM) capsule 2-4 mg  2-4 mg Oral PRN Gustavo Lah, CNM      . LORazepam (ATIVAN) tablet 2 mg  2 mg Oral Q4H PRN Gustavo Lah, CNM       Or  . LORazepam (ATIVAN) injection 2 mg  2 mg Intramuscular Q4H PRN Gustavo Lah, CNM      . medroxyPROGESTERone (DEPO-PROVERA) injection 150 mg  150 mg Intramuscular Once Gustavo Lah, CNM      . methocarbamol (ROBAXIN) tablet 500 mg  500 mg Oral Q8H PRN Gustavo Lah, CNM      . naproxen (NAPROSYN) tablet 500 mg  500 mg Oral BID PRN Gustavo Lah, CNM      . nicotine (NICODERM CQ - dosed in mg/24 hours) patch 14 mg  14 mg Transdermal Daily PRN Gustavo Lah, CNM      . ondansetron (ZOFRAN) tablet 4 mg  4 mg Oral Q4H PRN Gustavo Lah, CNM       Or  . ondansetron [facility]) injection 4 mg  4 mg Intravenous Q4H PRN Gustavo Lah, CNM      . ondansetron (ZOFRAN-ODT) disintegrating tablet 4 mg  4 mg Oral Q6H PRN Gustavo Lah, CNM      . oxytocin (PITOCIN) IV infusion 30 units in NS 500 mL - Premix  2.5 Units/hr Intravenous Continuous Gustavo Lah, CNM   Stopped at 08/11/19 2120  . prenatal multivitamin tablet 1 tablet  1 tablet Oral Q1200 Gustavo Lah, CNM   1 tablet at 08/12/19 1020  . simethicone (MYLICON) chewable tablet 80 mg  80 mg Oral PRN Gustavo Lah, CNM      . traMADol (ULTRAM) tablet 50 mg  50 mg Oral Q12H Gustavo Lah, CNM   50 mg at 08/12/19 1020    Musculoskeletal: Strength & Muscle Tone: within normal limits Gait & Station: normal Patient leans: N/A  Psychiatric Specialty Exam: Physical Exam  Review of Systems  Blood pressure  117/89, pulse 60, temperature 97.8 F (36.6 C), resp. rate 18, height  (1.651 m), weight 65.8 kg, SpO2 95 %, unknown if currently breastfeeding.Body mass index is 24.13 kg/m.  General Appearance: Disheveled  Eye Contact:  Fair  Speech:  Slurred  Volume:  Normal  Mood:  Angry and Irritable  Affect:  Congruent and Labile  Thought Process:  Linear and Descriptions of Associations: Intact  Orientation:  Full (Time, Place, and Person)  Thought Content:  Logical  Suicidal Thoughts:  No  Homicidal Thoughts:  No  Memory:  Immediate;   Good  Judgement:  Poor  Insight:  Shallow  Psychomotor Activity:  Normal  Concentration:  Concentration: Fair and Attention Span: Fair  Recall:  [employer] of Knowledge:  Fair  Language:  Fair  Akathisia:  No  Handed:  Right  AIMS (if indicated):     Assets:  Communication Skills Desire for Improvement  Financial Resources/Insurance Housing Leisure Time Physical Health Social Support  ADL's:  Intact  Cognition:  WNL  Sleep:        Treatment Plan Summary: Plan Will psychiatrically cleared patient at this time.  Patient recently delivered vaginally preterm baby.  Did discuss with Dr. Dalbert Garnet about pain management going forward, in the setting of polysubstance use and in management of withdrawals.  We also discussed additional postpartum planning of the baby, social work has been placed and will be about today to talk with patient.  Appropriate parties have been notified of patient's delivery and positive UDS in both mother and baby.  Baby is to remain in the NICU and appropriate authorities will be contacted as directed by medical staff.  Disposition: No evidence of imminent risk to self or others at present.   Patient does not meet criteria for psychiatric inpatient admission.  Maryagnes Amos, FNP 08/12/2019 12:43 PM

## 2019-08-12 NOTE — Progress Notes (Signed)
Post Partum 1  Subjective: no complaints, up ad lib, voiding and tolerating PO  Doing well, no concerns. Ambulating without difficulty, pain managed with PO meds, tolerating regular diet, and voiding without difficulty.   No fever/chills, chest pain, shortness of breath, nausea/vomiting, or leg pain. No nipple or breast pain. No headache, visual changes, or RUQ/epigastric pain.  Objective: BP 117/89 (BP Location: Left Arm)   Pulse 60   Temp 97.8 F (36.6 C)   Resp 18   Ht 5\' 5"  (1.651 m)   Wt 65.8 kg   LMP  (LMP Unknown) Comment: mother reports pt is currently 3 months pregnant as of 04/19/19  SpO2 95%   Breastfeeding Unknown   BMI 24.13 kg/m    Physical Exam:  General: alert, distracted and no distress Breasts: soft/nontender CV: RRR Pulm: nl effort, CTABL Abdomen: soft, non-tender, active bowel sounds Uterine Fundus: firm Perineum: minimal edema, lacerations hemostatic Lochia: appropriate DVT Evaluation: No evidence of DVT seen on physical exam.  Recent Labs    08/11/19 1417  HGB 9.5*  HCT 30.4*  WBC 7.8  PLT 216    Assessment/Plan: 34 y.o. 20 postpartum day # 1  -Continue routine postpartum care -Psych NP and SW evaluated patient today.  Plan pending for patient's parents to maintain custody of infant.  Infant remains in SCN -Encouraged snug fitting bra, cold application, Tylenol PRN, and cabbage leaves for engorgement for formula feeding  -Discussed contraceptive options including implant, IUDs hormonal and non-hormonal, injection, pills/ring/patch, condoms, and NFP. Desires depo.  Will give prior to discharge  -Anemia - hemodynamically stable and asymptomatic; declined labs today.  Had to be stuck multiple times yesterday for labs and IV with multiple failed attempts.  Discussed reason for checking CBC today, particularly d/t anemia in pregnancy.  Declined again.    Disposition: Desires discharge home today   LOS: 1 day   [id number], Gustavo Lah 08/12/2019,  12:34 PM   ----- 08/14/2019  Certified Nurse Midwife [facility]

## 2019-08-12 NOTE — Discharge Instructions (Signed)
Discharge Instructions:   Follow-up Appointment: Call ASAP and schedule a follow-up appointment for a visit with Margaretmary Eddy, CNM in 4 weeks!   If there are any new medications, they have been ordered and will be available for pickup at the listed pharmacy on your way home from the hospital.   Call office if you have any of the following: headache, visual changes, fever >101.0 F, chills, shortness of breath, breast concerns, excessive vaginal bleeding, incision drainage or problems, leg pain or redness, depression or any other concerns. If you have vaginal discharge with an odor, let your doctor know.   It is normal to bleed for up to 6 weeks. You should not soak through more than 1 pad in 1 hour. If you have a blood clot larger than your fist with continued bleeding, call your doctor.   Activity: Do not lift > 10 lbs for 6 weeks (do not lift anything heavier than your baby). No intercourse, tampons, swimming pools, hot tubs, baths (only showers) for 6 weeks.  No driving for 1-2 weeks. Continue prenatal vitamin, especially if breastfeeding. Increase calories and fluids (water) while breastfeeding.   Your milk will come in, in the next couple of days (right now it is colostrum). You may have a slight fever when your milk comes in, but it should go away on its own.  If it does not, and rises above 101 F please call the doctor. You will also feel achy and your breasts will be firm. They will also start to leak. If you are breastfeeding, continue as you have been and you can pump/express milk for comfort.   If you have too much milk, your breasts can become engorged, which could lead to mastitis. This is an infection of the milk ducts. It can be very painful and you will need to notify your doctor to obtain a prescription for antibiotics. You can also treat it with a shower or hot/cold compress.   For concerns about your baby, please call your pediatrician.  For breastfeeding concerns, the lactation  consultant can be reached at [phone].   Postpartum blues (feelings of happy one minute and sad another minute) are normal for the first few weeks but if it gets worse let your doctor know.   Congratulations! We enjoyed caring for you and your new bundle of joy!

## 2019-08-14 LAB — VARICELLA ZOSTER ANTIBODY, IGG: Varicella IgG: 492 index (ref >165)

## 2019-08-14 LAB — SURGICAL PATHOLOGY

## 2019-08-14 LAB — RUBELLA SCREEN: Rubella: <0.9 index — ABNORMAL LOW (ref >0.99)

## 2019-08-14 LAB — T.PALLIDUM AB, TOTAL: T Pallidum Abs: NONREACTIVE

## 2019-08-16 LAB — HCV RT-PCR, QUANT (NON-GRAPH)
HCV log10: 5.841 log10 IU/mL
Hepatitis C Quantitation: 694000 IU/mL

## 2019-08-16 LAB — HCV AB W REFLEX TO QUANT PCR: HCV Ab: >11 s/co ratio — ABNORMAL HIGH (ref 0.0–0.9)

## 2019-09-15 ENCOUNTER — Emergency Department
Admission: EM | Admit: 2019-09-15 | Discharge: 2019-09-16 | Disposition: A | Discharge status: Home / Self Care | Payer: Medicaid Other | Attending: Emergency Medicine | Admitting: Emergency Medicine

## 2019-09-15 ENCOUNTER — Encounter: Payer: Self-pay | Admitting: Emergency Medicine

## 2019-09-15 ENCOUNTER — Other Ambulatory Visit: Payer: Self-pay

## 2019-09-15 DIAGNOSIS — F17210 Nicotine dependence, cigarettes, uncomplicated: Secondary | ICD-10-CM | POA: Insufficient documentation

## 2019-09-15 DIAGNOSIS — J45909 Unspecified asthma, uncomplicated: Secondary | ICD-10-CM | POA: Insufficient documentation

## 2019-09-15 DIAGNOSIS — F419 Anxiety disorder, unspecified: Secondary | ICD-10-CM | POA: Insufficient documentation

## 2019-09-15 DIAGNOSIS — F319 Bipolar disorder, unspecified: Secondary | ICD-10-CM | POA: Insufficient documentation

## 2019-09-15 DIAGNOSIS — F4310 Post-traumatic stress disorder, unspecified: Secondary | ICD-10-CM | POA: Insufficient documentation

## 2019-09-15 DIAGNOSIS — Z79899 Other long term (current) drug therapy: Secondary | ICD-10-CM | POA: Insufficient documentation

## 2019-09-15 DIAGNOSIS — B182 Chronic viral hepatitis C: Secondary | ICD-10-CM | POA: Insufficient documentation

## 2019-09-15 DIAGNOSIS — R4182 Altered mental status, unspecified: Secondary | ICD-10-CM | POA: Diagnosis present

## 2019-09-15 LAB — CBC WITH DIFFERENTIAL/PLATELET
Abs Immature Granulocytes: 0.07 10*3/uL (ref 0.00–0.07)
Basophils Absolute: 0.1 10*3/uL (ref 0.0–0.1)
Basophils Relative: 1 %
Eosinophils Absolute: 0.9 10*3/uL — ABNORMAL HIGH (ref 0.0–0.5)
Eosinophils Relative: 8 %
HCT: 30.8 % — ABNORMAL LOW (ref 36.0–46.0)
Hemoglobin: 9.1 g/dL — ABNORMAL LOW (ref 12.0–15.0)
Immature Granulocytes: 1 %
Lymphocytes Relative: 30 %
Lymphs Abs: 3 10*3/uL (ref 0.7–4.0)
MCH: 22.5 pg — ABNORMAL LOW (ref 26.0–34.0)
MCHC: 29.5 g/dL — ABNORMAL LOW (ref 30.0–36.0)
MCV: 76 fL — ABNORMAL LOW (ref 80.0–100.0)
Monocytes Absolute: 1 10*3/uL (ref 0.1–1.0)
Monocytes Relative: 10 %
Neutro Abs: 5.2 10*3/uL (ref 1.7–7.7)
Neutrophils Relative %: 50 %
Platelets: 396 10*3/uL (ref 150–400)
RBC: 4.05 MIL/uL (ref 3.87–5.11)
RDW: 15.9 % — ABNORMAL HIGH (ref 11.5–15.5)
WBC: 10.2 10*3/uL (ref 4.0–10.5)
nRBC: 0 % (ref 0.0–0.2)

## 2019-09-15 LAB — HEPATIC FUNCTION PANEL
ALT: 22 U/L (ref 0–44)
AST: 28 U/L (ref 15–41)
Albumin: 2.7 g/dL — ABNORMAL LOW (ref 3.5–5.0)
Alkaline Phosphatase: 57 U/L (ref 38–126)
Bilirubin, Direct: 0.2 mg/dL (ref 0.0–0.2)
Indirect Bilirubin: 0.3 mg/dL (ref 0.3–0.9)
Total Bilirubin: 0.5 mg/dL (ref 0.3–1.2)
Total Protein: 6.2 g/dL — ABNORMAL LOW (ref 6.5–8.1)

## 2019-09-15 LAB — BASIC METABOLIC PANEL
Anion gap: 7 (ref 5–15)
BUN: 12 mg/dL (ref 6–20)
CO2: 23 mmol/L (ref 22–32)
Calcium: 7.1 mg/dL — ABNORMAL LOW (ref 8.9–10.3)
Chloride: 113 mmol/L — ABNORMAL HIGH (ref 98–111)
Creatinine, Ser: 0.65 mg/dL (ref 0.44–1.00)
GFR calc Af Amer: >60 mL/min (ref >60)
GFR calc non Af Amer: >60 mL/min (ref >60)
Glucose, Bld: 77 mg/dL (ref 70–99)
Potassium: 3.2 mmol/L — ABNORMAL LOW (ref 3.5–5.1)
Sodium: 143 mmol/L (ref 135–145)

## 2019-09-15 NOTE — ED Provider Notes (Signed)
[facility] Emergency Department Provider Note  ____________________________________________   I have reviewed the triage vital signs and the nursing notes.   HISTORY  Chief Complaint Respiratory Distress and Altered Mental Status   History limited by and level 5 caveat due to: Altered Mental Status   HPI Kathy [name] is a 34 y.o. female who presents to the emergency department today because of concern for altered mental status.  The patient herself cannot give a good history as to why she is here.  Per report the patient was noted to have abnormal fever at a store.  When EMS arrived they did find her O2 sats being 90% on room air.  She was given some Narcan and her mental status and respiratory rate improved.  Per chart review she does have a history of polysubstance abuse including opiate abuse.   Records reviewed. Per medical record review patient has a history of polysubstance abuse  Past Medical History:  Diagnosis Date  . Anxiety   . Asthma   . Bipolar disorder (HCC)   . Chronic hepatitis C virus infection (HCC) 02/28/2015   Overview:  Diagnosed 12/2014 GT FibroSure 0.03, F 0 fibrosis Tx. Naive  Risks-previous substance use  . Compression fracture of spine (HCC)   . Fibromyalgia   . Headache   . PTSD (post-traumatic stress disorder)   . Seizures (HCC)    Related to benzo withdrawal  . Seizures [facility])     Patient Active Problem List   Diagnosis Date Noted  . Preterm premature rupture of membranes in third trimester 08/11/2019  . Opiate overdose (HCC)   . Polysubstance dependence including opioid type drug, continuous use (HCC) 11/30/2016  . Severe episode of recurrent major depressive disorder, without psychotic features (HCC) 11/25/2016  . History of 3 spontaneous abortions 03/24/2016  . Eczema 03/24/2016  . Depression 03/24/2016  . Asthma without status asthmaticus without complication 03/24/2016  . Opiate abuse, continuous (HCC) 03/24/2016   . Fibromyalgia 04/17/2015  . Chronic migraine 07/23/2014  . Pap smear of cervix with ASCUS, cannot exclude HGSIL 06/08/2013  . Bipolar disorder (HCC) 05/10/2013  . Generalized anxiety disorder 05/10/2013  . Polysubstance abuse (HCC) 10/03/2012    Past Surgical History:  Procedure Laterality Date  . HAND SURGERY Right   . HAND SURGERY      Prior to Admission medications   Medication Sig Start Date End Date Taking? Authorizing Provider  acetaminophen (TYLENOL) 500 MG tablet Take 2 tablets (1,000 mg total) by mouth every 6 (six) hours as needed (for pain scale < 4). 08/12/19   Gustavo Lah, CNM  albuterol (VENTOLIN HFA) 108 (90 Base) MCG/ACT inhaler Inhale 2 puffs into the lungs every 6 (six) hours as needed for wheezing or shortness of breath. 03/28/19   Phineas Semen, MD  famotidine (PEPCID) 20 MG tablet Take 1 tablet (20 mg total) by mouth daily. 08/13/19   Gustavo Lah, CNM  ferrous sulfate 325 (65 FE) MG EC tablet Take 1 tablet (325 mg total) by mouth 2 (two) times daily. Patient not taking: Reported on 08/11/2019 07/30/19 08/29/19  Concha Se, MD  gabapentin (NEURONTIN) 300 MG capsule Take 300 mg by mouth 3 (three) times daily.     [provider]  hydrOXYzine (ATARAX/VISTARIL) 50 MG tablet Take 1 tablet (50 mg) by mouth four times daily as needed: For anxiety 04/04/17   Armandina Stammer I, NP  ibuprofen (ADVIL) 600 MG tablet Take 1 tablet (600 mg total) by mouth every  6 (six) hours as needed. 08/12/19   Gustavo Lah, CNM  naloxone [facility]) nasal spray 4 mg/0.1 mL Use for overdose of heroin/opioids. 12/25/18   Concha Se, MD    Allergies Patient has no known allergies.  History reviewed. No pertinent family history.  Social History Social History   Tobacco Use  . Smoking status: Current Every Day Smoker    Packs/day: 1.00    Types: Cigarettes  . Smokeless tobacco: Never Used  . Tobacco comment: Pt does not want to stop smoking at this time  Vaping Use  .  Vaping Use: Never used  Substance Use Topics  . Alcohol use: Not Currently    Comment: unknown  . Drug use: Yes    Types: Cocaine, Heroin, Hydrocodone, Benzodiazepines    Comment: benzodiazpine 5 days ago. Cocaine: last used not sure; Heroin: last used 08/11/2019;     Review of Systems Unable to obtain reliable ROS secondary to mental status. ____________________________________________   PHYSICAL EXAM:  VITAL SIGNS: ED Triage Vitals  Enc Vitals Group     BP 09/15/19 2003 (!) 142/108     Pulse Rate 09/15/19 2003 90     Resp 09/15/19 2003 16     Temp 09/15/19 2003 98 F (36.7 C)     Temp Source 09/15/19 2003 Oral     SpO2 09/15/19 2003 100 %     Weight 09/15/19 2004 130 lb (59 kg)     Height 09/15/19 2004 5\' 3"  (1.6 m)     Head Circumference --      Peak Flow --      Pain Score 09/15/19 2004 0   Constitutional: Somnolent. Awakens to physical stimuli. Eyes: Conjunctivae are normal.  ENT      Head: Normocephalic and atraumatic.      Nose: No congestion/rhinnorhea.      Mouth/Throat: Mucous membranes are moist.      Neck: No stridor. Hematological/Lymphatic/Immunilogical: No cervical lymphadenopathy. Cardiovascular: Normal rate, regular rhythm.  No murmurs, rubs, or gallops.  Respiratory: Normal respiratory effort without tachypnea nor retractions. Breath sounds are clear and equal bilaterally. No wheezes/rales/rhonchi. Gastrointestinal: Soft and non tender. No rebound. No guarding.  Genitourinary: Deferred Musculoskeletal: Normal range of motion in all extremities. No lower extremity edema. Neurologic: Somnolent. Awakens to physical stimuli. Skin:  Skin is warm, dry and intact. No rash noted. Psychiatric: Mood and affect are normal. Speech and behavior are normal. Patient exhibits appropriate insight and judgment.  ____________________________________________    LABS (pertinent positives/negatives)  CBC wbc 10.2, hgb 9.1, plt 396 BMP wnl except k 3.2, cl 113, ca  7.1  ____________________________________________   EKG  None  ____________________________________________    RADIOLOGY  None  ____________________________________________   PROCEDURES  Procedures  ____________________________________________   INITIAL IMPRESSION / ASSESSMENT AND PLAN / ED COURSE  Pertinent labs & imaging results that were available during my care of the patient were reviewed by me and considered in my medical decision making (see chart for details).   Patient presented to the emergency department today because of concerns for altered mental status. Per EMS report she did respond to Narcan. Patient does have a history of polysubstance abuse. Blood work here without any concerning findings. Calcium does correct to nearly normal. At this time I do think polysubstance use and opioid overdose likely the cause of the patient's symptoms. Will observe here in the emergency department.  ___________________________________________   FINAL CLINICAL IMPRESSION(S) / ED DIAGNOSES  Final diagnoses:  Altered mental  status, unspecified altered mental status type     Note: This dictation was prepared with Dragon dictation. Any transcriptional errors that result from this process are unintentional     Phineas Semen, MD 09/15/19 [phone]
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
## 2019-09-15 NOTE — ED Notes (Signed)
Mother called, reports pt is a heroin addict, and "this is a on going issue" pt "lives with someone in [place]"  Call with changes to condition

## 2019-09-16 NOTE — ED Notes (Signed)
Pt provided w/ phone to try to call for ride.

## 2019-09-16 NOTE — ED Notes (Signed)
Pt informed by EDP that if she can find a ride home she is able to be discharged.  Pt appears to be sleeping at this time, unable to find a ride home.

## 2019-09-16 NOTE — Discharge Instructions (Addendum)
Return to the ER for worsening symptoms, persistent vomiting, difficulty breathing or other concerns. °

## 2019-09-16 NOTE — ED Notes (Signed)
Pt sitting up in bed. Placed back on cardiac monitor by this RN. Pt eating food, speech is garbled. NAD noted, VSS. No other needs expressed at this time.

## 2019-09-16 NOTE — ED Provider Notes (Signed)
-----------------------------------------   12:52 AM on 09/16/2019 -----------------------------------------  Patient is awake and eating a [employer] sandwich.  Room air saturations 95%.  Voices no complaints.  Will call a ride to pick her up.  Strict return precautions given.  Patient verbalizes understanding agrees with plan of care.   ----------------------------------------- 6:39 AM on 09/16/2019 -----------------------------------------  No one available to pick patient up overnight.  Patient will continue to try to call for a ride.   Irean Hong, MD 09/16/19 [phone]

## 2019-09-16 NOTE — ED Notes (Signed)
This RN called pt's mother from the phone number in the demographics.  Father states " he needs to contact DSS to arrange for pt pickup by pt's parents d/t pt being Under restriction with DSS and contact with pt by parents could jeopardize custody for the pt's parents".   Call father before DC [phone] as he is attempting to arrange transportation for pt.

## 2019-11-22 ENCOUNTER — Encounter: Payer: Self-pay | Admitting: *Deleted

## 2019-11-22 ENCOUNTER — Other Ambulatory Visit: Payer: Self-pay

## 2019-11-22 ENCOUNTER — Emergency Department
Admission: EM | Admit: 2019-11-22 | Discharge: 2019-11-22 | Payer: Medicaid Other | Attending: Emergency Medicine | Admitting: Emergency Medicine

## 2019-11-22 DIAGNOSIS — F17210 Nicotine dependence, cigarettes, uncomplicated: Secondary | ICD-10-CM | POA: Diagnosis not present

## 2019-11-22 DIAGNOSIS — T192XXA Foreign body in vulva and vagina, initial encounter: Secondary | ICD-10-CM | POA: Insufficient documentation

## 2019-11-22 DIAGNOSIS — Z008 Encounter for other general examination: Secondary | ICD-10-CM

## 2019-11-22 DIAGNOSIS — X509XXA Other and unspecified overexertion or strenuous movements or postures, initial encounter: Secondary | ICD-10-CM | POA: Diagnosis not present

## 2019-11-22 DIAGNOSIS — Z87821 Personal history of retained foreign body fully removed: Secondary | ICD-10-CM | POA: Diagnosis not present

## 2019-11-22 DIAGNOSIS — J45909 Unspecified asthma, uncomplicated: Secondary | ICD-10-CM | POA: Diagnosis not present

## 2019-11-22 MED ORDER — NALOXONE HCL 0.4 MG/ML IJ SOLN: 0.4000 mg | INTRAMUSCULAR | Status: DC | PRN

## 2019-11-22 MED ORDER — ALBUTEROL SULFATE HFA 108 (90 BASE) MCG/ACT IN AERS: 2.0000 | INHALATION_SPRAY | Freq: Four times a day (QID) | RESPIRATORY_TRACT | 0 refills | Status: DC | PRN

## 2019-11-22 NOTE — ED Notes (Addendum)
Small yellow bag removed from pt's vagina. Bag twisted but not tied. Pt in NAD. Narcan not used at this time but remains available if needed at a later time. Yellow bag handed immediately to officer/detective [place] at bedside.
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
## 2019-11-22 NOTE — ED Notes (Signed)
Raquel RN sending narcan via tube system shortly. To be used per Provider JB only if bag bursts during removal or for any other possible exposure during pelvic assessment.

## 2019-11-22 NOTE — ED Notes (Signed)
This RN at bedside with Mimi RN. Narcan at bedside. Provider JB completing exam.

## 2019-11-22 NOTE — Discharge Instructions (Signed)
Patient is medically cleared for transport via [employer] to detention center.   Retained foreign body removed.
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
## 2020-01-05 ENCOUNTER — Emergency Department
Admission: EM | Admit: 2020-01-05 | Discharge: 2020-01-06 | Disposition: A | Discharge status: Home / Self Care | Payer: Medicaid Other | Attending: Emergency Medicine | Admitting: Emergency Medicine

## 2020-01-05 ENCOUNTER — Encounter: Payer: Self-pay | Admitting: Emergency Medicine

## 2020-01-05 ENCOUNTER — Other Ambulatory Visit: Payer: Self-pay

## 2020-01-05 DIAGNOSIS — F19920 Other psychoactive substance use, unspecified with intoxication, uncomplicated: Secondary | ICD-10-CM

## 2020-01-05 DIAGNOSIS — R462 Strange and inexplicable behavior: Secondary | ICD-10-CM | POA: Diagnosis present

## 2020-01-05 DIAGNOSIS — Z79899 Other long term (current) drug therapy: Secondary | ICD-10-CM | POA: Diagnosis not present

## 2020-01-05 MED ORDER — SODIUM CHLORIDE 0.9 % IV BOLUS
1000.0000 mL | Freq: Once | INTRAVENOUS | Status: AC
  Administered 2020-01-05: 1000 mL via INTRAVENOUS

## 2020-01-05 MED ORDER — NALOXONE HCL 2 MG/2ML IJ SOSY: 0.4000 mg | PREFILLED_SYRINGE | Freq: Once | INTRAMUSCULAR | Status: DC

## 2020-01-05 NOTE — ED Provider Notes (Signed)
-----------------------------------------   11:57 PM on 01/05/2020 -----------------------------------------  Patient seen standing up in her room trying to remove her leads.  She was guided back to her bed.  Admits to drug use tonight.  Denies intentional overdose or SI.  States she is feeling much better.  Provided something to eat and drink.  Will administer IV fluids.   ----------------------------------------- 1:00 AM on 01/06/2020 -----------------------------------------  IV infiltrated.  Subsequent attempts unsuccessful.  Patient eating and drinking without difficulty.  Has no one to get her overnight.  Will remain in the ED overnight until morning.   ----------------------------------------- 6:25 AM on 01/06/2020 -----------------------------------------  Patient awake and alert, ambulating with steady gait.  Declines bus pass and wishes to walk.  Will discharge when it is daylight.  Strict return precautions given.  Patient verbalizes understanding agrees with plan of care.   Irean Hong, MD 01/06/20 [phone]

## 2020-01-05 NOTE — ED Notes (Signed)
When patient stops talking, respirations decrease to 6 or 7 respirations per minute. Derrill Kay MD made aware, responded to bedside.

## 2020-01-05 NOTE — ED Notes (Addendum)
Yellow falls band and yellow non-slip socks placed on patient. Patient also placed on bed alarm.

## 2020-01-05 NOTE — ED Triage Notes (Signed)
[place] [employer] was called due to bizarre behavior as well as AMS by pt. Pt to ED with slurred speech and excessive drowsiness. Pt asleep during triage and this RN woke pt with sternal rub. O2 RA at 90% but back to 98% RA when awake. Pt refused to answer if ingested or used illegal substance tonight. Pt not able to stay awake long enough in triage to answer questions.

## 2020-01-05 NOTE — ED Notes (Signed)
Patient O2 dropping to 88% when sleeping, 2L nasal cannula placed on patient with improvement to 95%.

## 2020-01-05 NOTE — ED Notes (Signed)
Patient intermittently falling asleep and then waking up and speaking incoherently for multiple minutes at a time.

## 2020-01-05 NOTE — Discharge Instructions (Signed)
Please seek medical attention for any high fevers, chest pain, shortness of breath, change in behavior, persistent vomiting, bloody stool or any other new or concerning symptoms.  

## 2020-01-05 NOTE — ED Provider Notes (Signed)
[facility] Emergency Department Provider Note  ____________________________________________   I have reviewed the triage vital signs and the nursing notes.   HISTORY  Chief Complaint Bizarre behavior  History limited by and level 5 caveat due to: AMS   HPI Kathy [name] is a 34 y.o. female who presents to the emergency department today because of concern for bizarre behavior. Unfortunately the patient herself is unable to give a good history as to what brought her to the ED. The patient apparently was found to be exhibiting bizarre behavior at a local gas station. The patient does state that she goes to that gas station everyday.   Records reviewed. Per medical record review patient has a history of bipolar, polysubstance abuse. Has required narcan in the past for opioid abuse.   Past Medical History:  Diagnosis Date  . Anxiety   . Asthma   . Bipolar disorder (HCC)   . Chronic hepatitis C virus infection (HCC) 02/28/2015   Overview:  Diagnosed 12/2014 GT FibroSure 0.03, F 0 fibrosis Tx. Naive  Risks-previous substance use  . Compression fracture of spine (HCC)   . Fibromyalgia   . Headache   . PTSD (post-traumatic stress disorder)   . Seizures (HCC)    Related to benzo withdrawal  . Seizures [facility])     Patient Active Problem List   Diagnosis Date Noted  . Preterm premature rupture of membranes in third trimester 08/11/2019  . Opiate overdose (HCC)   . Polysubstance dependence including opioid type drug, continuous use (HCC) 11/30/2016  . Severe episode of recurrent major depressive disorder, without psychotic features (HCC) 11/25/2016  . History of 3 spontaneous abortions 03/24/2016  . Eczema 03/24/2016  . Depression 03/24/2016  . Asthma without status asthmaticus without complication 03/24/2016  . Opiate abuse, continuous (HCC) 03/24/2016  . Fibromyalgia 04/17/2015  . Chronic migraine 07/23/2014  . Pap smear of cervix with ASCUS, cannot exclude  HGSIL 06/08/2013  . Bipolar disorder (HCC) 05/10/2013  . Generalized anxiety disorder 05/10/2013  . Polysubstance abuse (HCC) 10/03/2012    Past Surgical History:  Procedure Laterality Date  . HAND SURGERY Right   . HAND SURGERY      Prior to Admission medications   Medication Sig Start Date End Date Taking? Authorizing Provider  albuterol (VENTOLIN HFA) 108 (90 Base) MCG/ACT inhaler Inhale 2 puffs into the lungs every 6 (six) hours as needed for wheezing or shortness of breath. 11/22/19  Yes Menshew, Charlesetta Ivory, PA-C  acetaminophen (TYLENOL) 500 MG tablet Take 2 tablets (1,000 mg total) by mouth every 6 (six) hours as needed (for pain scale < 4). Patient not taking: Reported on 01/05/2020 08/12/19   Gustavo Lah, CNM  famotidine (PEPCID) 20 MG tablet Take 1 tablet (20 mg total) by mouth daily. Patient not taking: Reported on 01/05/2020 08/13/19   Gustavo Lah, CNM  ferrous sulfate 325 (65 FE) MG EC tablet Take 1 tablet (325 mg total) by mouth 2 (two) times daily. Patient not taking: Reported on 08/11/2019 07/30/19 08/29/19  Concha Se, MD  gabapentin (NEURONTIN) 300 MG capsule Take 300 mg by mouth 3 (three) times daily.  Patient not taking: Reported on 09/15/2019    [provider]  hydrOXYzine (ATARAX/VISTARIL) 50 MG tablet Take 1 tablet (50 mg) by mouth four times daily as needed: For anxiety Patient not taking: Reported on 09/15/2019 04/04/17   Armandina Stammer I, NP  ibuprofen (ADVIL) 600 MG tablet Take 1 tablet (600 mg total) by  mouth every 6 (six) hours as needed. Patient not taking: Reported on 01/05/2020 08/12/19   Gustavo Lah, CNM  naloxone [facility]) nasal spray 4 mg/0.1 mL Use for overdose of heroin/opioids. Patient not taking: Reported on 01/05/2020 12/25/18   Concha Se, MD    Allergies Patient has no known allergies.  History reviewed. No pertinent family history.  Social History Social History   Tobacco Use  . Smoking status: Current Every Day Smoker     Packs/day: 1.00    Types: Cigarettes  . Smokeless tobacco: Never Used  . Tobacco comment: Pt does not want to stop smoking at this time  Vaping Use  . Vaping Use: Never used  Substance Use Topics  . Alcohol use: Not Currently    Comment: unknown  . Drug use: Yes    Types: Cocaine, Heroin, Hydrocodone, Benzodiazepines    Comment: benzodiazpine 5 days ago. Cocaine: last used not sure; Heroin: last used 08/11/2019;     Review of Systems Unable to obtain reliable ROS. ____________________________________________   PHYSICAL EXAM:  VITAL SIGNS: ED Triage Vitals [01/05/20 2152]  Enc Vitals Group     BP (!) 140/112     Pulse Rate 75     Resp 14     Temp      Temp src      SpO2 90 %   Constitutional: Awake and alert. Not oriented. Eyes: Conjunctivae are normal.  ENT      Head: Normocephalic and atraumatic.      Nose: No congestion/rhinnorhea.      Mouth/Throat: Mucous membranes are moist.      Neck: No stridor. Hematological/Lymphatic/Immunilogical: No cervical lymphadenopathy. Cardiovascular: Normal rate, regular rhythm.  No murmurs, rubs, or gallops.  Respiratory: Normal respiratory effort without tachypnea nor retractions. Breath sounds are clear and equal bilaterally. No wheezes/rales/rhonchi. Gastrointestinal: Soft and non tender. No rebound. No guarding.  Genitourinary: Deferred Musculoskeletal: Normal range of motion in all extremities. No lower extremity edema. Neurologic:  Slightly pressured speech. Not oriented. Moving all extremities. Skin:  Skin is warm, dry and intact. No rash noted. Psychiatric: Slightly agitated.   ____________________________________________    LABS (pertinent positives/negatives)  None  ____________________________________________   EKG  None  ____________________________________________     RADIOLOGY  None  ____________________________________________   PROCEDURES  Procedures  ____________________________________________   INITIAL IMPRESSION / ASSESSMENT AND PLAN / ED COURSE  Pertinent labs & imaging results that were available during my care of the patient were reviewed by me and considered in my medical decision making (see chart for details).   Presented to the emergency department today because of concerns for bizarre behavior.  On exam patient is somewhat agitated and not completely oriented.  She is somewhat rocking back and forth in the stretcher.  Does appear that the patient is intoxicated.  Will plan on observing for sobriety.  Repeat exam after roughly 2 hours here in the emergency department shows that the patient is still somewhat agitated although much more lucid.  Is able to have a much more reasonable conversation.  Will continue to observe.  ____________________________________________   FINAL CLINICAL IMPRESSION(S) / ED DIAGNOSES  Final diagnoses:  Bizarre behavior  Drug intoxication without complication [facility])     Note: This dictation was prepared with Dragon dictation. Any transcriptional errors that result from this process are unintentional     Phineas Semen, MD 01/05/20 2334

## 2020-01-06 NOTE — ED Notes (Signed)
Patient provided with ice, sprite, and sandwich box per request.

## 2020-01-06 NOTE — ED Notes (Signed)
This RN inquiring about discharge planning for patient, patient states she does not have anyone to come pick her up so she would need to get a cab or walk to her destination. Pt states she is homeless and usually walks where she needs to go. MD Dolores Frame made aware.

## 2020-01-06 NOTE — ED Notes (Signed)
Pox placed back on pt. Pt covered up with blankets, lights dimmed for comfort.

## 2020-01-12 ENCOUNTER — Other Ambulatory Visit: Payer: Self-pay

## 2020-01-12 ENCOUNTER — Emergency Department: Payer: Medicaid Other

## 2020-01-12 ENCOUNTER — Emergency Department
Admission: EM | Admit: 2020-01-12 | Discharge: 2020-01-12 | Disposition: A | Discharge status: Home / Self Care | Payer: Medicaid Other | Attending: Emergency Medicine | Admitting: Emergency Medicine

## 2020-01-12 ENCOUNTER — Encounter: Payer: Self-pay | Admitting: *Deleted

## 2020-01-12 DIAGNOSIS — F17210 Nicotine dependence, cigarettes, uncomplicated: Secondary | ICD-10-CM | POA: Diagnosis not present

## 2020-01-12 DIAGNOSIS — J45901 Unspecified asthma with (acute) exacerbation: Secondary | ICD-10-CM

## 2020-01-12 DIAGNOSIS — Z79899 Other long term (current) drug therapy: Secondary | ICD-10-CM | POA: Insufficient documentation

## 2020-01-12 DIAGNOSIS — H00014 Hordeolum externum left upper eyelid: Secondary | ICD-10-CM | POA: Insufficient documentation

## 2020-01-12 DIAGNOSIS — Z20822 Contact with and (suspected) exposure to covid-19: Secondary | ICD-10-CM | POA: Insufficient documentation

## 2020-01-12 DIAGNOSIS — J45909 Unspecified asthma, uncomplicated: Secondary | ICD-10-CM | POA: Diagnosis not present

## 2020-01-12 DIAGNOSIS — R0602 Shortness of breath: Secondary | ICD-10-CM | POA: Diagnosis present

## 2020-01-12 LAB — SALICYLATE LEVEL: Salicylate Lvl: <7 mg/dL — ABNORMAL LOW (ref 7.0–30.0)

## 2020-01-12 LAB — WET PREP, GENITAL
Clue Cells Wet Prep HPF POC: NONE SEEN
Sperm: NONE SEEN
Trich, Wet Prep: NONE SEEN
Yeast Wet Prep HPF POC: NONE SEEN

## 2020-01-12 LAB — CBC WITH DIFFERENTIAL/PLATELET
Abs Immature Granulocytes: 0.01 10*3/uL (ref 0.00–0.07)
Basophils Absolute: 0 10*3/uL (ref 0.0–0.1)
Basophils Relative: 0 %
Eosinophils Absolute: 0.3 10*3/uL (ref 0.0–0.5)
Eosinophils Relative: 4 %
HCT: 33.8 % — ABNORMAL LOW (ref 36.0–46.0)
Hemoglobin: 10.4 g/dL — ABNORMAL LOW (ref 12.0–15.0)
Immature Granulocytes: 0 %
Lymphocytes Relative: 25 %
Lymphs Abs: 1.8 10*3/uL (ref 0.7–4.0)
MCH: 23.9 pg — ABNORMAL LOW (ref 26.0–34.0)
MCHC: 30.8 g/dL (ref 30.0–36.0)
MCV: 77.5 fL — ABNORMAL LOW (ref 80.0–100.0)
Monocytes Absolute: 0.7 10*3/uL (ref 0.1–1.0)
Monocytes Relative: 10 %
Neutro Abs: 4.2 10*3/uL (ref 1.7–7.7)
Neutrophils Relative %: 61 %
Platelets: 267 10*3/uL (ref 150–400)
RBC: 4.36 MIL/uL (ref 3.87–5.11)
RDW: 19.4 % — ABNORMAL HIGH (ref 11.5–15.5)
WBC: 6.9 10*3/uL (ref 4.0–10.5)
nRBC: 0 % (ref 0.0–0.2)

## 2020-01-12 LAB — RESP PANEL BY RT-PCR (FLU A&B, COVID) ARPGX2
Influenza A by PCR: NEGATIVE
Influenza B by PCR: NEGATIVE
SARS Coronavirus 2 by RT PCR: NEGATIVE

## 2020-01-12 LAB — COMPREHENSIVE METABOLIC PANEL
ALT: 56 U/L — ABNORMAL HIGH (ref 0–44)
AST: 58 U/L — ABNORMAL HIGH (ref 15–41)
Albumin: 3.5 g/dL (ref 3.5–5.0)
Alkaline Phosphatase: 53 U/L (ref 38–126)
Anion gap: 8 (ref 5–15)
BUN: 11 mg/dL (ref 6–20)
CO2: 27 mmol/L (ref 22–32)
Calcium: 8.6 mg/dL — ABNORMAL LOW (ref 8.9–10.3)
Chloride: 103 mmol/L (ref 98–111)
Creatinine, Ser: 0.62 mg/dL (ref 0.44–1.00)
GFR, Estimated: >60 mL/min (ref >60)
Glucose, Bld: 117 mg/dL — ABNORMAL HIGH (ref 70–99)
Potassium: 3.9 mmol/L (ref 3.5–5.1)
Sodium: 138 mmol/L (ref 135–145)
Total Bilirubin: 0.3 mg/dL (ref 0.3–1.2)
Total Protein: 7.1 g/dL (ref 6.5–8.1)

## 2020-01-12 LAB — CHLAMYDIA/NGC RT PCR (ARMC ONLY)
Chlamydia Tr: NOT DETECTED
N gonorrhoeae: NOT DETECTED

## 2020-01-12 LAB — ETHANOL: Alcohol, Ethyl (B): <10 mg/dL (ref <10)

## 2020-01-12 LAB — ACETAMINOPHEN LEVEL: Acetaminophen (Tylenol), Serum: <10 ug/mL — ABNORMAL LOW (ref 10–30)

## 2020-01-12 MED ORDER — ALBUTEROL SULFATE (2.5 MG/3ML) 0.083% IN NEBU
5.0000 mg | INHALATION_SOLUTION | Freq: Once | RESPIRATORY_TRACT | Status: AC
  Administered 2020-01-12: 5 mg via RESPIRATORY_TRACT
  Filled 2020-01-12: qty 6

## 2020-01-12 MED ORDER — ERYTHROMYCIN 5 MG/GM OP OINT
TOPICAL_OINTMENT | Freq: Once | OPHTHALMIC | Status: AC
  Administered 2020-01-12: 1 via OPHTHALMIC
  Filled 2020-01-12: qty 1

## 2020-01-12 MED ORDER — SODIUM CHLORIDE 0.9 % IV BOLUS
500.0000 mL | Freq: Once | INTRAVENOUS | Status: AC
  Administered 2020-01-12: 500 mL via INTRAVENOUS

## 2020-01-12 MED ORDER — METHYLPREDNISOLONE SODIUM SUCC 125 MG IJ SOLR
125.0000 mg | Freq: Once | INTRAMUSCULAR | Status: AC
  Administered 2020-01-12: 125 mg via INTRAVENOUS
  Filled 2020-01-12: qty 2

## 2020-01-12 MED ORDER — LORAZEPAM 2 MG/ML IJ SOLN
1.0000 mg | Freq: Once | INTRAMUSCULAR | Status: AC
  Administered 2020-01-12: 1 mg via INTRAVENOUS
  Filled 2020-01-12: qty 1

## 2020-01-12 MED ORDER — PREDNISONE 20 MG PO TABS: ORAL_TABLET | ORAL | 0 refills | Status: DC

## 2020-01-12 MED ORDER — ERYTHROMYCIN 5 MG/GM OP OINT: 1.0000 "application " | TOPICAL_OINTMENT | Freq: Three times a day (TID) | OPHTHALMIC | 0 refills | Status: AC

## 2020-01-12 NOTE — ED Provider Notes (Signed)
[facility] Emergency Department Provider Note   ____________________________________________   First MD Initiated Contact with Patient 01/12/20 [phone]     (approximate)  I have reviewed the triage vital signs and the nursing notes.   HISTORY  Chief Complaint Shortness of Breath    HPI Kathy [name] is a [age] y.o. female who [place] the ED from the streets with multiple medical complaints:  1.  Wheezing, shortness of breath 2.  Left eye infection 3.  Right finger infection 4.  Vaginal discharge 5.  Homeless  Patient with a history of substance abuse who was seen in the ED 7 days ago for bizarre behavior secondary to substance use.  States she is now homeless.  Denies substance use tonight; patient appears jittery and restless.  Denies fever, chest pain, abdominal pain, nausea, vomiting or diarrhea.      Past Medical History:  Diagnosis Date  . Anxiety   . Asthma   . Bipolar disorder (HCC)   . Chronic hepatitis C virus infection (HCC) 02/28/2015   Overview:  Diagnosed 12/2014 GT FibroSure 0.03, F 0 fibrosis Tx. Naive  Risks-previous substance use  . Compression fracture of spine (HCC)   . Fibromyalgia   . Headache   . PTSD (post-traumatic stress disorder)   . Seizures (HCC)    Related to benzo withdrawal  . Seizures [facility](HCC)     Patient Active Problem List   Diagnosis Date Noted  . Preterm premature rupture of membranes in third trimester 08/11/2019  . Opiate overdose (HCC)   . Polysubstance dependence including opioid type drug, continuous use (HCC) 11/30/2016  . Severe episode of recurrent major depressive disorder, without psychotic features (HCC) 11/25/2016  . History of 3 spontaneous abortions 03/24/2016  . Eczema 03/24/2016  . Depression 03/24/2016  . Asthma without status asthmaticus without complication 03/24/2016  . Opiate abuse, continuous (HCC) 03/24/2016  . Fibromyalgia 04/17/2015  . Chronic migraine 07/23/2014  . Pap smear  of cervix with ASCUS, cannot exclude HGSIL 06/08/2013  . Bipolar disorder (HCC) 05/10/2013  . Generalized anxiety disorder 05/10/2013  . Polysubstance abuse (HCC) 10/03/2012    Past Surgical History:  Procedure Laterality Date  . HAND SURGERY Right   . HAND SURGERY      Prior to Admission medications   Medication Sig Start Date End Date Taking? Authorizing Provider  acetaminophen (TYLENOL) 500 MG tablet Take 2 tablets (1,000 mg total) by mouth every 6 (six) hours as needed (for pain scale < 4). Patient not taking: Reported on 01/05/2020 08/12/19   Gustavo LahMackie, Anna M, CNM  albuterol (VENTOLIN HFA) 108 (90 Base) MCG/ACT inhaler Inhale 2 puffs into the lungs every 6 (six) hours as needed for wheezing or shortness of breath. 11/22/19   Menshew, Charlesetta IvoryJenise V Bacon, PA-C  erythromycin ophthalmic ointment Place 1 application into the left eye 3 (three) times daily for 7 days. 01/12/20 01/19/20  Irean HongSung, Jaquel Glassburn J, MD  famotidine (PEPCID) 20 MG tablet Take 1 tablet (20 mg total) by mouth daily. Patient not taking: Reported on 01/05/2020 08/13/19   Gustavo LahMackie, Anna M, CNM  ferrous sulfate 325 (65 FE) MG EC tablet Take 1 tablet (325 mg total) by mouth 2 (two) times daily. Patient not taking: Reported on 08/11/2019 07/30/19 08/29/19  Concha SeFunke, Mary E, MD  gabapentin (NEURONTIN) 300 MG capsule Take 300 mg by mouth 3 (three) times daily.  Patient not taking: Reported on 09/15/2019    [provider]  hydrOXYzine (ATARAX/VISTARIL) 50 MG tablet Take 1  tablet (50 mg) by mouth four times daily as needed: For anxiety Patient not taking: Reported on 09/15/2019 04/04/17   Armandina Stammer I, NP  ibuprofen (ADVIL) 600 MG tablet Take 1 tablet (600 mg total) by mouth every 6 (six) hours as needed. Patient not taking: Reported on 01/05/2020 08/12/19   Gustavo Lah, CNM  naloxone [facility]) nasal spray 4 mg/0.1 mL Use for overdose of heroin/opioids. Patient not taking: Reported on 01/05/2020 12/25/18   Concha Se, MD  predniSONE  (DELTASONE) 20 MG tablet 3 tablets daily x 4 days 01/12/20   Irean Hong, MD    Allergies Patient has no known allergies.  History reviewed. No pertinent family history.  Social History Social History   Tobacco Use  . Smoking status: Current Every Day Smoker    Packs/day: 1.00    Types: Cigarettes  . Smokeless tobacco: Never Used  . Tobacco comment: Pt does not want to stop smoking at this time  Vaping Use  . Vaping Use: Never used  Substance Use Topics  . Alcohol use: Not Currently    Comment: unknown  . Drug use: Yes    Types: Cocaine, Heroin, Hydrocodone, Benzodiazepines    Comment: heroin 11/25 last use, amphetamines last use 11/25.    Review of Systems  Constitutional: No fever/chills Eyes: Positive for left eye infection.  No visual changes. ENT: No sore throat. Cardiovascular: Denies chest pain. Respiratory: Positive for wheezing and shortness of breath. Gastrointestinal: No abdominal pain.  No nausea, no vomiting.  No diarrhea.  No constipation. Genitourinary: Positive for vaginal discharge.  Negative for dysuria. Musculoskeletal: Negative for back pain. Skin: Positive for right finger infection.  Negative for rash. Neurological: Negative for headaches, focal weakness or numbness. Psychiatric:  Positive for substance use.  ____________________________________________   PHYSICAL EXAM:  VITAL SIGNS: ED Triage Vitals  Enc Vitals Group     BP 01/12/20 0252 119/87     Pulse Rate 01/12/20 0252 (!) 106     Resp 01/12/20 0252 20     Temp 01/12/20 0252 98.5 F (36.9 C)     Temp Source 01/12/20 0252 Oral     SpO2 01/12/20 0252 90 %     Weight 01/12/20 0255 135 lb (61.2 kg)     Height 01/12/20 0255 5\' 3"  (1.6 m)     Head Circumference --      Peak Flow --      Pain Score 01/12/20 0254 6     Pain Loc --      Pain Edu? --      Excl. in GC? --     Constitutional: Alert and oriented.  Disheveled appearing and in mild acute distress. Eyes: Left stye noted.   Conjunctivae are normal. PERRL. EOMI. Head: Atraumatic. Nose: No congestion/rhinnorhea. Mouth/Throat: Mucous membranes are mildly dry.   Neck: No stridor.   Cardiovascular: Tachycardic rate, regular rhythm. Grossly normal heart sounds.  Good peripheral circulation. Respiratory: Normal respiratory effort.  No retractions. Lungs with scattered wheezing and rhonchi. Gastrointestinal: Soft and nontender. No distention. No abdominal bruits. No CVA tenderness. Musculoskeletal:  Right index finger with burn mark to medial aspect, likely secondary to drug paraphernalia.  No active infection noted. No lower extremity tenderness nor edema.  No joint effusions. Neurologic:  Normal speech and language. No gross focal neurologic deficits are appreciated. No gait instability. Skin:  Skin is warm, dry and intact. No rash noted. Psychiatric: Mood and affect are restless, jittery, anxious. Speech and behavior appear  intoxicated.  ____________________________________________   LABS (all labs ordered are listed, but only abnormal results are displayed)  Labs Reviewed  WET PREP, GENITAL - Abnormal; Notable for the following components:      Result Value   WBC, Wet Prep HPF POC RARE (*)    All other components within normal limits  CBC WITH DIFFERENTIAL/PLATELET - Abnormal; Notable for the following components:   Hemoglobin 10.4 (*)    HCT 33.8 (*)    MCV 77.5 (*)    MCH 23.9 (*)    RDW 19.4 (*)    All other components within normal limits  COMPREHENSIVE METABOLIC PANEL - Abnormal; Notable for the following components:   Glucose, Bld 117 (*)    Calcium 8.6 (*)    AST 58 (*)    ALT 56 (*)    All other components within normal limits  ACETAMINOPHEN LEVEL - Abnormal; Notable for the following components:   Acetaminophen (Tylenol), Serum <10 (*)    All other components within normal limits  SALICYLATE LEVEL - Abnormal; Notable for the following components:   Salicylate Lvl <7.0 (*)    All other  components within normal limits  CHLAMYDIA/NGC RT PCR ([facility] ONLY)  RESP PANEL BY RT-PCR (FLU A&B, COVID) ARPGX2  ETHANOL  URINE DRUG SCREEN, QUALITATIVE ([facility] ONLY)  URINALYSIS, COMPLETE (UACMP) WITH MICROSCOPIC  POC URINE PREG, ED   ____________________________________________  EKG  ED ECG REPORT I, Kelven Flater J, the attending physician, personally viewed and interpreted this ECG.   Date: 01/12/2020  EKG Time: 0320  Rate: 99  Rhythm: normal EKG, normal sinus rhythm  Axis: Normal  Intervals:none  ST&T Change: Nonspecific   ____________________________________________  RADIOLOGY I, Annalina Needles J, personally viewed and evaluated these images (plain radiographs) as part of my medical decision making, as well as reviewing the written report by the radiologist.  ED MD interpretation: No pneumonia  Official radiology report(s): DG Chest 2 View  Result Date: 01/12/2020 CLINICAL DATA:  Multiple complaints including wheezing, rales, and shortness of breath. Infections in the left eye and right index finger. EXAM: CHEST - 2 VIEW COMPARISON:  03/28/2019 FINDINGS: Hyperinflation. Central peribronchial thickening may indicate airways disease or bronchiolitis. No focal consolidation or edema. No pleural effusions. No pneumothorax. Mediastinal contours appear intact. IMPRESSION: Hyperinflation and central peribronchial thickening may indicate airways disease or bronchiolitis. No focal consolidation. Electronically Signed   By: Burman Nieves M.D.   On: 01/12/2020 03:54    ____________________________________________   PROCEDURES  Procedure(s) performed (including Critical Care):  .1-3 Lead EKG Interpretation Performed by: Irean Hong, MD Authorized by: Irean Hong, MD     Interpretation: abnormal     ECG rate:  100   ECG rate assessment: tachycardic     Rhythm: sinus tachycardia     Ectopy: none     Conduction: normal   Comments:     Patient placed on cardiac  monitor to evaluate for arrhythmias     ____________________________________________   INITIAL IMPRESSION / ASSESSMENT AND PLAN / ED COURSE  As part of my medical decision making, I reviewed the following data within the electronic MEDICAL RECORD NUMBER Nursing notes reviewed and incorporated, Labs reviewed, EKG interpreted, Old chart reviewed, Radiograph reviewed and Notes from prior ED visits (01/05/2020, numerous ED visits for substance use)     34 year old homeless female with significant polysubstance abuse presenting with multiple medical complaints, chiefly shortness of breath with wheezing. Differential includes, but is not limited to, viral syndrome, bronchitis including COPD  exacerbation, pneumonia, reactive airway disease including asthma, CHF including exacerbation with or without pulmonary/interstitial edema, pneumothorax, ACS, thoracic trauma, and pulmonary embolism.  Will obtain work-up including chest x-ray, respiratory panel, STI testing.  Patient given albuterol nebulizer per protocol.  Will administer 125 mg IV Solu-Medrol, 1 mg IV Ativan for calming, erythromycin ophthalmic ointment to left stye, initiate IV fluid resuscitation and reassess.   Clinical Course as of Jan 11 699  Sat Jan 12, 2020  9323 Patient resting in NAD; improved after Albuterol nebulizer.   [JS]  0600 Patient ate a sandwich tray and is now currently sleeping in no acute distress.  Anticipate discharge home once patient is sober and ambulatory with steady gait.   [JS]  0700 Care transferred to Dr. Cyril Loosen pending sobriety.    [JS]    Clinical Course User Index [JS] Irean Hong, MD     ____________________________________________   FINAL CLINICAL IMPRESSION(S) / ED DIAGNOSES  Final diagnoses:  Moderate asthma with acute exacerbation, unspecified whether persistent  Hordeolum externum of left upper eyelid     ED Discharge Orders         Ordered    predniSONE (DELTASONE) 20 MG tablet          01/12/20 0606    erythromycin ophthalmic ointment  3 times daily        01/12/20 0606          *Please note:  Kathy [name] was evaluated in Emergency Department on 01/12/2020 for the symptoms described in the history of present illness. She was evaluated in the context of the global COVID-19 pandemic, which necessitated consideration that the patient might be at risk for infection with the SARS-CoV-2 virus that causes COVID-19. Institutional protocols and algorithms that pertain to the evaluation of patients at risk for COVID-19 are in a state of rapid change based on information released by regulatory bodies including the CDC and federal and state organizations. These policies and algorithms were followed during the patient's care in the ED.  Some ED evaluations and interventions may be delayed as a result of limited staffing during and the pandemic.*   Note:  This document was prepared using Dragon voice recognition software and may include unintentional dictation errors.   Irean Hong, MD 01/12/20 [phone]

## 2020-01-12 NOTE — Discharge Instructions (Signed)
1.  Take Prednisone 60 mg daily x4 days. 2.  Apply antibiotic ointment to left upper eyelid 3 times daily x7 days. 3.  Apply warm compress to left eyelid several times daily to reduce swelling. 4.  Return to the ER for worsening symptoms, persistent vomiting, difficulty breathing or other concerns.

## 2020-01-12 NOTE — ED Notes (Signed)
Pt given meal tray at this time 

## 2020-01-12 NOTE — ED Triage Notes (Signed)
Pt presents w/ multiple complaints, of most significance is her audible wheezing, rales, and shortness of breath. Pt also has infection in L eye and wound on R index finger that is infected. Pt additionally c/o vaginal discharge and pain. Pt unsure if vaginal bleeding is a result of her period or some other problem. Pt admits to  Being homeless presently.

## 2020-02-28 ENCOUNTER — Emergency Department (HOSPITAL_COMMUNITY): Payer: Medicaid Other

## 2020-02-28 ENCOUNTER — Other Ambulatory Visit: Payer: Self-pay

## 2020-02-28 ENCOUNTER — Encounter (HOSPITAL_COMMUNITY): Payer: Self-pay

## 2020-02-28 ENCOUNTER — Emergency Department (HOSPITAL_COMMUNITY)
Admission: EM | Admit: 2020-02-28 | Discharge: 2020-02-28 | Disposition: A | Discharge status: Home / Self Care | Payer: Medicaid Other | Attending: Emergency Medicine | Admitting: Emergency Medicine

## 2020-02-28 DIAGNOSIS — F17210 Nicotine dependence, cigarettes, uncomplicated: Secondary | ICD-10-CM | POA: Insufficient documentation

## 2020-02-28 DIAGNOSIS — J45909 Unspecified asthma, uncomplicated: Secondary | ICD-10-CM | POA: Diagnosis not present

## 2020-02-28 DIAGNOSIS — J4531 Mild persistent asthma with (acute) exacerbation: Secondary | ICD-10-CM

## 2020-02-28 DIAGNOSIS — R0602 Shortness of breath: Secondary | ICD-10-CM

## 2020-02-28 DIAGNOSIS — Z20822 Contact with and (suspected) exposure to covid-19: Secondary | ICD-10-CM | POA: Diagnosis not present

## 2020-02-28 LAB — BASIC METABOLIC PANEL
Anion gap: 10 (ref 5–15)
BUN: 18 mg/dL (ref 6–20)
CO2: 27 mmol/L (ref 22–32)
Calcium: 9.5 mg/dL (ref 8.9–10.3)
Chloride: 104 mmol/L (ref 98–111)
Creatinine, Ser: 0.78 mg/dL (ref 0.44–1.00)
GFR, Estimated: >60 mL/min (ref >60)
Glucose, Bld: 102 mg/dL — ABNORMAL HIGH (ref 70–99)
Potassium: 4 mmol/L (ref 3.5–5.1)
Sodium: 141 mmol/L (ref 135–145)

## 2020-02-28 LAB — TROPONIN I (HIGH SENSITIVITY): Troponin I (High Sensitivity): 2 ng/L (ref <18)

## 2020-02-28 LAB — HEPATIC FUNCTION PANEL
ALT: 52 U/L — ABNORMAL HIGH (ref 0–44)
AST: 50 U/L — ABNORMAL HIGH (ref 15–41)
Albumin: 3.9 g/dL (ref 3.5–5.0)
Alkaline Phosphatase: 50 U/L (ref 38–126)
Bilirubin, Direct: 0.3 mg/dL — ABNORMAL HIGH (ref 0.0–0.2)
Indirect Bilirubin: 0.3 mg/dL (ref 0.3–0.9)
Total Bilirubin: 0.6 mg/dL (ref 0.3–1.2)
Total Protein: 7.6 g/dL (ref 6.5–8.1)

## 2020-02-28 LAB — POC SARS CORONAVIRUS 2 AG -  ED: SARS Coronavirus 2 Ag: NEGATIVE

## 2020-02-28 LAB — CBC
HCT: 40.5 % (ref 36.0–46.0)
Hemoglobin: 12.4 g/dL (ref 12.0–15.0)
MCH: 25.2 pg — ABNORMAL LOW (ref 26.0–34.0)
MCHC: 30.6 g/dL (ref 30.0–36.0)
MCV: 82.3 fL (ref 80.0–100.0)
Platelets: 329 10*3/uL (ref 150–400)
RBC: 4.92 MIL/uL (ref 3.87–5.11)
RDW: 18.1 % — ABNORMAL HIGH (ref 11.5–15.5)
WBC: 9.7 10*3/uL (ref 4.0–10.5)
nRBC: 0 % (ref 0.0–0.2)

## 2020-02-28 LAB — RESP PANEL BY RT-PCR (FLU A&B, COVID) ARPGX2
Influenza A by PCR: NEGATIVE
Influenza B by PCR: NEGATIVE
SARS Coronavirus 2 by RT PCR: NEGATIVE

## 2020-02-28 MED ORDER — ALBUTEROL SULFATE HFA 108 (90 BASE) MCG/ACT IN AERS
4.0000 | INHALATION_SPRAY | Freq: Once | RESPIRATORY_TRACT | Status: AC
  Administered 2020-02-28: 4 via RESPIRATORY_TRACT

## 2020-02-28 MED ORDER — MAGNESIUM SULFATE 2 GM/50ML IV SOLN
2.0000 g | Freq: Once | INTRAVENOUS | Status: AC
  Administered 2020-02-28: 2 g via INTRAVENOUS
  Filled 2020-02-28: qty 50

## 2020-02-28 MED ORDER — LACTATED RINGERS IV BOLUS
500.0000 mL | Freq: Once | INTRAVENOUS | Status: AC
  Administered 2020-02-28: 500 mL via INTRAVENOUS

## 2020-02-28 MED ORDER — ALBUTEROL SULFATE HFA 108 (90 BASE) MCG/ACT IN AERS: 2.0000 | INHALATION_SPRAY | RESPIRATORY_TRACT | 2 refills | Status: AC | PRN

## 2020-02-28 MED ORDER — PREDNISONE 20 MG PO TABS
60.0000 mg | ORAL_TABLET | Freq: Once | ORAL | Status: AC
  Administered 2020-02-28: 60 mg via ORAL
  Filled 2020-02-28: qty 3

## 2020-02-28 MED ORDER — ALBUTEROL SULFATE HFA 108 (90 BASE) MCG/ACT IN AERS: 4.0000 | INHALATION_SPRAY | Freq: Once | RESPIRATORY_TRACT | Status: DC

## 2020-02-28 MED ORDER — ALBUTEROL SULFATE (2.5 MG/3ML) 0.083% IN NEBU
5.0000 mg | INHALATION_SOLUTION | Freq: Once | RESPIRATORY_TRACT | Status: AC
  Administered 2020-02-28: 5 mg via RESPIRATORY_TRACT
  Filled 2020-02-28: qty 6

## 2020-02-28 MED ORDER — ALBUTEROL SULFATE HFA 108 (90 BASE) MCG/ACT IN AERS
2.0000 | INHALATION_SPRAY | RESPIRATORY_TRACT | Status: DC | PRN
  Administered 2020-02-28: 2 via RESPIRATORY_TRACT
  Filled 2020-02-28: qty 6.7

## 2020-02-28 MED ORDER — IPRATROPIUM BROMIDE 0.02 % IN SOLN
0.5000 mg | Freq: Once | RESPIRATORY_TRACT | Status: AC
  Administered 2020-02-28: 0.5 mg via RESPIRATORY_TRACT
  Filled 2020-02-28: qty 2.5

## 2020-02-28 MED ORDER — ALBUTEROL (5 MG/ML) CONTINUOUS INHALATION SOLN
10.0000 mg/h | INHALATION_SOLUTION | Freq: Once | RESPIRATORY_TRACT | Status: AC
  Administered 2020-02-28: 10 mg/h via RESPIRATORY_TRACT
  Filled 2020-02-28: qty 20

## 2020-02-28 MED ORDER — IPRATROPIUM BROMIDE HFA 17 MCG/ACT IN AERS
2.0000 | INHALATION_SPRAY | Freq: Once | RESPIRATORY_TRACT | Status: AC
  Administered 2020-02-28: 2 via RESPIRATORY_TRACT
  Filled 2020-02-28: qty 12.9

## 2020-02-28 MED ORDER — PREDNISONE 20 MG PO TABS: 40.0000 mg | ORAL_TABLET | Freq: Every day | ORAL | 0 refills | Status: AC

## 2020-02-28 MED ORDER — IOHEXOL 350 MG/ML SOLN
100.0000 mL | Freq: Once | INTRAVENOUS | Status: AC | PRN
  Administered 2020-02-28: 75 mL via INTRAVENOUS

## 2020-02-28 NOTE — ED Notes (Signed)
Pt given water, crackers, peanut butter, and a sandwich and was able to keep those down with difficulty.

## 2020-02-28 NOTE — ED Provider Notes (Signed)
[facility]-EMERGENCY DEPT Provider Note   CSN: [id number] Arrival date & time: 02/28/20  1033     History Chief Complaint  Patient presents with  . Shortness of Breath  . Asthma    Kathy [name] is a 35 y.o. female presenting for evaluation of shortness of breath.  Patient states she has been more short of breath than normal since yesterday.  She states it feels like her asthma, however worse than normal.  She reports associated cough.  She denies fevers, chills, chest pain, nausea, vomiting, abdominal pain.  She did not have her albuterol inhaler at home.  She states it was very difficult to sleep last night due to her shortness of breath.  She denies sick contacts.  She is not vaccinated against COVID.  Shortness of breath is constant, nothing makes it better or worse.  Additional history obtained from chart review.  Patient with a history of asthma, anxiety, bipolar, fibromyalgia, polysubstance abuse.  HPI     Past Medical History:  Diagnosis Date  . Anxiety   . Asthma   . Bipolar disorder (HCC)   . Chronic hepatitis C virus infection (HCC) 02/28/2015   Overview:  Diagnosed 12/2014 GT FibroSure 0.03, F 0 fibrosis Tx. Naive  Risks-previous substance use  . Compression fracture of spine (HCC)   . Fibromyalgia   . Headache   . PTSD (post-traumatic stress disorder)   . Seizures (HCC)    Related to benzo withdrawal  . Seizures [facility](HCC)     Patient Active Problem List   Diagnosis Date Noted  . Preterm premature rupture of membranes in third trimester 08/11/2019  . Opiate overdose (HCC)   . Polysubstance dependence including opioid type drug, continuous use (HCC) 11/30/2016  . Severe episode of recurrent major depressive disorder, without psychotic features (HCC) 11/25/2016  . History of 3 spontaneous abortions 03/24/2016  . Eczema 03/24/2016  . Depression 03/24/2016  . Asthma without status asthmaticus without complication 03/24/2016  . Opiate abuse,  continuous (HCC) 03/24/2016  . Fibromyalgia 04/17/2015  . Chronic migraine 07/23/2014  . Pap smear of cervix with ASCUS, cannot exclude HGSIL 06/08/2013  . Bipolar disorder (HCC) 05/10/2013  . Generalized anxiety disorder 05/10/2013  . Polysubstance abuse (HCC) 10/03/2012    Past Surgical History:  Procedure Laterality Date  . HAND SURGERY Right   . HAND SURGERY       OB History    Gravida  7   Para  4   Term  3   Preterm  1   AB  3   Living  4     SAB  1   IAB  2   Ectopic  0   Multiple  0   Live Births  4        Obstetric Comments  Pt's 3 children live with her brother per mother. Pt is currently 3 months pregnant as of 04/19/19.        Family History  Family history unknown: Yes    Social History   Tobacco Use  . Smoking status: Current Every Day Smoker    Packs/day: 1.00    Types: Cigarettes  . Smokeless tobacco: Never Used  . Tobacco comment: Pt does not want to stop smoking at this time  Vaping Use  . Vaping Use: Never used  Substance Use Topics  . Alcohol use: Not Currently  . Drug use: Not Currently    Types: Heroin, Hydrocodone, Benzodiazepines    Home Medications Prior to  Admission medications   Medication Sig Start Date End Date Taking? Authorizing Provider  acetaminophen (TYLENOL) 500 MG tablet Take 2 tablets (1,000 mg total) by mouth every 6 (six) hours as needed (for pain scale < 4). Patient not taking: Reported on 01/05/2020 08/12/19   Gustavo Lah, CNM  albuterol (VENTOLIN HFA) 108 (90 Base) MCG/ACT inhaler Inhale 2 puffs into the lungs every 6 (six) hours as needed for wheezing or shortness of breath. 11/22/19   Menshew, Charlesetta Ivory, PA-C  famotidine (PEPCID) 20 MG tablet Take 1 tablet (20 mg total) by mouth daily. Patient not taking: Reported on 01/05/2020 08/13/19   Gustavo Lah, CNM  ferrous sulfate 325 (65 FE) MG EC tablet Take 1 tablet (325 mg total) by mouth 2 (two) times daily. Patient not taking: Reported on  08/11/2019 07/30/19 08/29/19  Concha Se, MD  gabapentin (NEURONTIN) 300 MG capsule Take 300 mg by mouth 3 (three) times daily.  Patient not taking: Reported on 09/15/2019    [provider]  hydrOXYzine (ATARAX/VISTARIL) 50 MG tablet Take 1 tablet (50 mg) by mouth four times daily as needed: For anxiety Patient not taking: Reported on 09/15/2019 04/04/17   Armandina Stammer I, NP  ibuprofen (ADVIL) 600 MG tablet Take 1 tablet (600 mg total) by mouth every 6 (six) hours as needed. Patient not taking: Reported on 01/05/2020 08/12/19   Gustavo Lah, CNM  naloxone [facility]) nasal spray 4 mg/0.1 mL Use for overdose of heroin/opioids. Patient not taking: Reported on 01/05/2020 12/25/18   Concha Se, MD  predniSONE (DELTASONE) 20 MG tablet 3 tablets daily x 4 days 01/12/20   Irean Hong, MD    Allergies    Patient has no known allergies.  Review of Systems   Review of Systems  Respiratory: Positive for cough, chest tightness and shortness of breath.   All other systems reviewed and are negative.   Physical Exam Updated Vital Signs BP 94/76   Pulse 70   Temp 97.9 F (36.6 C) (Oral)   Resp 18   Ht 5\' 3"  (1.6 m)   Wt 61.2 kg   LMP 01/28/2020 (Approximate)   SpO2 94%   BMI 23.91 kg/m   Physical Exam Vitals and nursing note reviewed.  Constitutional:      General: She is not in acute distress.    Appearance: She is well-developed and well-nourished.     Comments: Appears unkept. Otherwise nontoxic  HENT:     Head: Normocephalic and atraumatic.  Eyes:     Extraocular Movements: EOM normal.     Conjunctiva/sclera: Conjunctivae normal.     Pupils: Pupils are equal, round, and reactive to light.  Cardiovascular:     Rate and Rhythm: Normal rate and regular rhythm.     Pulses: Normal pulses and intact distal pulses.  Pulmonary:     Effort: Pulmonary effort is normal. No respiratory distress.     Breath sounds: Wheezing present.     Comments: Expiratory wheezing in all fields.   Speaking in full sentences.  Sats stable on room air. Abdominal:     General: There is no distension.     Palpations: Abdomen is soft. There is no mass.     Tenderness: There is no abdominal tenderness. There is no guarding or rebound.  Musculoskeletal:        General: Normal range of motion.     Cervical back: Normal range of motion and neck supple.     Right  lower leg: No edema.     Left lower leg: No edema.  Skin:    General: Skin is warm and dry.     Capillary Refill: Capillary refill takes less than 2 seconds.  Neurological:     Mental Status: She is alert and oriented to person, place, and time.  Psychiatric:        Mood and Affect: Mood and affect normal.     ED Results / Procedures / Treatments   Labs (all labs ordered are listed, but only abnormal results are displayed) Labs Reviewed  BASIC METABOLIC PANEL - Abnormal; Notable for the following components:      Result Value   Glucose, Bld 102 (*)    All other components within normal limits  CBC - Abnormal; Notable for the following components:   MCH 25.2 (*)    RDW 18.1 (*)    All other components within normal limits  RESP PANEL BY RT-PCR (FLU A&B, COVID) ARPGX2  POC SARS CORONAVIRUS 2 AG -  ED    EKG None  Radiology No results found.  Procedures Procedures (including critical care time)  Medications Ordered in ED Medications  albuterol (VENTOLIN HFA) 108 (90 Base) MCG/ACT inhaler 2 puff (2 puffs Inhalation Given 02/28/20 1049)  albuterol (PROVENTIL) (2.5 MG/3ML) 0.083% nebulizer solution 5 mg (has no administration in time range)  ipratropium (ATROVENT) nebulizer solution 0.5 mg (has no administration in time range)  albuterol (VENTOLIN HFA) 108 (90 Base) MCG/ACT inhaler 4 puff (4 puffs Inhalation Given 02/28/20 1402)  ipratropium (ATROVENT HFA) inhaler 2 puff (2 puffs Inhalation Given 02/28/20 1403)  predniSONE (DELTASONE) tablet 60 mg (60 mg Oral Given 02/28/20 1403)    ED Course  I have reviewed the  triage vital signs and the nursing notes.  Pertinent labs & imaging results that were available during my care of the patient were reviewed by me and considered in my medical decision making (see chart for details).    MDM Rules/Calculators/A&P                          Pt presenting for evaluation of shortness of breath.  On exam, patient has significant wheezing.  She does have a history of asthma, does not have her albuterol at home, this is likely the cause of her symptoms.  Also consider this being triggered by a viral illness including COVID.  Labs obtained from triage interpreted by me, overall reassuring.  No acidosis.  Electrolytes are stable.  She did receive 2 puffs of albuterol prior to my evaluation, she reports this improves her symptoms, but did not resolve them.  We will give further albuterol, ipratropium, prednisone, and reassess.  On reassessment, patient continues to have significant wheezing.  She states her symptoms continue to improve, however she still feels slightly short of breath.  Rapid COVID test was negative.  As patient is without fever, and I favor asthma exacerbation over viral illness, I believe patient would do well with a nebulized breathing treatment.  As patient has needed multiple breathing treatments, will also obtain chest x-ray and to ensure no pneumonia.  Patient signed out to ED Jeraldine Loots, PA-C for follow-up on chest x-ray and reevaluation after nebulized breathing treatment.  If patient is able to ambulate without hypoxia and has improvement in shortness of breath, she can likely be discharged.  Kathy [name] was evaluated in Emergency Department on 02/28/2020 for the symptoms described in the history of present illness.  She was evaluated in the context of the global COVID-19 pandemic, which necessitated consideration that the patient might be at risk for infection with the SARS-CoV-2 virus that causes COVID-19. Institutional protocols and algorithms that  pertain to the evaluation of patients at risk for COVID-19 are in a state of rapid change based on information released by regulatory bodies including the CDC and federal and state organizations. These policies and algorithms were followed during the patient's care in the ED.   Final Clinical Impression(s) / ED Diagnoses Final diagnoses:  None    Rx / DC Orders ED Discharge Orders    None       Alveria Apley, PA-C 02/28/20 1531    Vanetta Mulders, MD 03/06/20 1744

## 2020-02-28 NOTE — Discharge Instructions (Signed)
There is help if you need it.  Please do not use dirty needles, this could cause you a severe infection to your skin, heart or spinal cord.  This could kill you or leave you permanently disabled.  There was a recent study done at [facility] that showed that the risk of death for someone that had unintentionally overdosed on narcotics was as high as 15% in the next year.  This is much higher than most every other medical condition.  [facility] Solution to the Opioid Problem (GCSTOP) Fixed; mobile; peer-based Anders Grant [phone] [email or web address] Fixed site exchange at [facility], Wednesdays (2-5pm) and Thursdays (4-8pm). [address] [place], [place] [address] Call or text to arrange mobile and peer exchange, Mondays (1-4pm) and Fridays (4-7pm). Serving [facility] [email or web address]  Suboxone clinic: [facility] [address] [place], [address]  [facility] [address] [place], [address]  [facility] [address] [place] [address]   I have given you a prescription for steroids today.  Some common side effects include feelings of extra energy, feeling warm, increased appetite, and stomach upset.  If you are diabetic your sugars may run higher than usual.

## 2020-02-28 NOTE — ED Triage Notes (Signed)
Patient c/o SOB/asthma since yesterday.

## 2020-02-28 NOTE — ED Notes (Signed)
Pt ambulated around room and SpO2 stayed around 97-100%.

## 2020-02-28 NOTE — ED Provider Notes (Signed)
I assumed care of patient at shift change from previous team, please see their note for full H&P. Briefly patient is here for shortness of breath with concern for an asthma exacerbation. She does have polysubstance abuse and tells me the last time she injected IV was within the past month. She has already gotten p.o. steroids, Atrovent and albuterol puffs and nebulizer. Plan is to reevaluate and follow-up on chest x-ray.  Chest x-ray shows congestion changes consistent with reactive airway disease and her asthma. I reevaluated patient, she was still significantly wheezy and when I had her ambulate in the room she dropped to 89% on room air with increased work of breathing. She has hour-long albuterol treatment ordered along with IV magnesium and IV fluids. Given that patient does inject IV drugs and has in the past month combined with her hypoxia concerning for PE versus possibly septic emboli. CTA PE study is obtained, no evidence of PE, lung abnormalities or septic emboli.  After hour-long albuterol by reambulated her and she was able to maintain oxygen saturations primarily between 96 to 97% with improved work of breathing. She was noted to be very jittery also and her heart rate was into the 150s. Additional IV fluids are ordered and she is allowed to eat and drink. She was monitored in the emergency room and this improved into the 110s, patient stated she felt better and was ready for discharge.  Kathy [name] was evaluated in Emergency Department on 02/29/2020 for the symptoms described in the history of present illness. She was evaluated in the context of the global COVID-19 pandemic, which necessitated consideration that the patient might be at risk for infection with the SARS-CoV-2 virus that causes COVID-19. Institutional protocols and algorithms that pertain to the evaluation of patients at risk for COVID-19 are in a state of rapid change based on information released by regulatory bodies  including the CDC and federal and state organizations. These policies and algorithms were followed during the patient's care in the ED.  Return precautions were discussed with patient who states their understanding.  At the time of discharge patient denied any unaddressed complaints or concerns.  Patient is agreeable for discharge home.  Note: Portions of this report may have been transcribed using voice recognition software. Every effort was made to ensure accuracy; however, inadvertent computerized transcription errors may be present     2149 patient reevaluated, I reambulated her on room air and she stayed above 92%, primarily around 96 to 97%, however her heart rate was noted to be in the 140s to 150s.  She also is jittery which I suspect is related to the albuterol.  We will give additional fluids, allow patient to eat and drink and monitor for improvement as I suspect this is temporary.   Medications  albuterol (VENTOLIN HFA) 108 (90 Base) MCG/ACT inhaler 2 puff (2 puffs Inhalation Given 02/28/20 1049)  albuterol (VENTOLIN HFA) 108 (90 Base) MCG/ACT inhaler 4 puff (4 puffs Inhalation Given 02/28/20 1402)  ipratropium (ATROVENT HFA) inhaler 2 puff (2 puffs Inhalation Given 02/28/20 1403)  predniSONE (DELTASONE) tablet 60 mg (60 mg Oral Given 02/28/20 1403)  albuterol (PROVENTIL) (2.5 MG/3ML) 0.083% nebulizer solution 5 mg (5 mg Nebulization Given 02/28/20 1532)  ipratropium (ATROVENT) nebulizer solution 0.5 mg (0.5 mg Nebulization Given 02/28/20 1532)  magnesium sulfate IVPB 2 g 50 mL (0 g Intravenous Stopped 02/28/20 2124)  albuterol (PROVENTIL,VENTOLIN) solution continuous neb (10 mg/hr Nebulization Given 02/28/20 1946)  lactated ringers bolus 500 mL (0 mLs  Intravenous Stopped 02/28/20 2124)  iohexol (OMNIPAQUE) 350 MG/ML injection 100 mL (75 mLs Intravenous Contrast Given 02/28/20 2107)  lactated ringers bolus 500 mL (0 mLs Intravenous Stopped 02/28/20 2312)    DG Chest 2 View  Result Date:  02/28/2020 CLINICAL DATA:  Shortness of breath.  History of asthma. EXAM: CHEST - 2 VIEW COMPARISON:  January 12, 2020. FINDINGS: The heart size and mediastinal contours are within normal limits. No consolidation. Similar hyperinflation and central bronchial wall thickening. No visible pleural effusions or pneumothorax. IMPRESSION: Similar hyperinflation and central bronchial wall thickening, possibly related to reactive airways disease or bronchiolitis. No consolidation. Electronically Signed   By: Feliberto Harts MD   On: 02/28/2020 16:12   CT Angio Chest PE W/Cm &/Or Wo Cm  Result Date: 02/28/2020 CLINICAL DATA:  Hypoxia EXAM: CT ANGIOGRAPHY CHEST WITH CONTRAST TECHNIQUE: Multidetector CT imaging of the chest was performed using the standard protocol during bolus administration of intravenous contrast. Multiplanar CT image reconstructions and MIPs were obtained to evaluate the vascular anatomy. CONTRAST:  45mL OMNIPAQUE IOHEXOL 350 MG/ML SOLN COMPARISON:  None. FINDINGS: Cardiovascular: Heart is normal size. Aorta is normal caliber. No filling defects in the pulmonary arteries to suggest pulmonary emboli. Mediastinum/Nodes: No mediastinal, hilar, or axillary adenopathy. Trachea and esophagus are unremarkable. Thyroid unremarkable. Lungs/Pleura: No confluent opacities or effusions. Upper Abdomen: Diffuse fatty infiltration of the liver suspected. Musculoskeletal: Chest wall soft tissues are unremarkable. No acute bony abnormality. Review of the MIP images confirms the above findings. IMPRESSION: No evidence of pulmonary embolus. No acute cardiopulmonary disease. Suspect hepatic steatosis. Electronically Signed   By: Charlett Nose M.D.   On: 02/28/2020 21:17    Labs Reviewed  BASIC METABOLIC PANEL - Abnormal; Notable for the following components:      Result Value   Glucose, Bld 102 (*)    All other components within normal limits  CBC - Abnormal; Notable for the following components:   MCH 25.2 (*)     RDW 18.1 (*)    All other components within normal limits  HEPATIC FUNCTION PANEL - Abnormal; Notable for the following components:   AST 50 (*)    ALT 52 (*)    Bilirubin, Direct 0.3 (*)    All other components within normal limits  RESP PANEL BY RT-PCR (FLU A&B, COVID) ARPGX2  RAPID URINE DRUG SCREEN, HOSP PERFORMED  POC SARS CORONAVIRUS 2 AG -  ED  I-STAT BETA HCG BLOOD, ED ([facility], [facility], [facility] ONLY)  TROPONIN I (HIGH SENSITIVITY)       Norman Clay 02/29/20 Charlsie Merles, MD 03/05/20 1744

## 2020-02-29 LAB — I-STAT BETA HCG BLOOD, ED (MC, WL, AP ONLY): I-stat hCG, quantitative: <5 m[IU]/mL (ref <5)

## 2020-03-13 ENCOUNTER — Inpatient Hospital Stay
Admission: EM | Admit: 2020-03-13 | Discharge: 2020-03-14 | DRG: 203 | Disposition: A | Discharge status: Home / Self Care | Payer: Medicaid Other | Attending: Internal Medicine | Admitting: Internal Medicine

## 2020-03-13 ENCOUNTER — Other Ambulatory Visit: Payer: Self-pay

## 2020-03-13 ENCOUNTER — Emergency Department: Payer: Medicaid Other

## 2020-03-13 DIAGNOSIS — F1910 Other psychoactive substance abuse, uncomplicated: Secondary | ICD-10-CM | POA: Diagnosis present

## 2020-03-13 DIAGNOSIS — D509 Iron deficiency anemia, unspecified: Secondary | ICD-10-CM

## 2020-03-13 DIAGNOSIS — D72828 Other elevated white blood cell count: Secondary | ICD-10-CM | POA: Diagnosis present

## 2020-03-13 DIAGNOSIS — M797 Fibromyalgia: Secondary | ICD-10-CM | POA: Diagnosis present

## 2020-03-13 DIAGNOSIS — J4541 Moderate persistent asthma with (acute) exacerbation: Secondary | ICD-10-CM | POA: Diagnosis not present

## 2020-03-13 DIAGNOSIS — J4521 Mild intermittent asthma with (acute) exacerbation: Principal | ICD-10-CM | POA: Diagnosis present

## 2020-03-13 DIAGNOSIS — R0602 Shortness of breath: Secondary | ICD-10-CM | POA: Diagnosis not present

## 2020-03-13 DIAGNOSIS — F4310 Post-traumatic stress disorder, unspecified: Secondary | ICD-10-CM | POA: Diagnosis present

## 2020-03-13 DIAGNOSIS — F17210 Nicotine dependence, cigarettes, uncomplicated: Secondary | ICD-10-CM | POA: Diagnosis present

## 2020-03-13 DIAGNOSIS — J45901 Unspecified asthma with (acute) exacerbation: Secondary | ICD-10-CM | POA: Diagnosis present

## 2020-03-13 DIAGNOSIS — F418 Other specified anxiety disorders: Secondary | ICD-10-CM | POA: Diagnosis present

## 2020-03-13 DIAGNOSIS — K219 Gastro-esophageal reflux disease without esophagitis: Secondary | ICD-10-CM | POA: Diagnosis present

## 2020-03-13 DIAGNOSIS — Z720 Tobacco use: Secondary | ICD-10-CM | POA: Diagnosis not present

## 2020-03-13 DIAGNOSIS — Z20822 Contact with and (suspected) exposure to covid-19: Secondary | ICD-10-CM | POA: Diagnosis present

## 2020-03-13 DIAGNOSIS — D72829 Elevated white blood cell count, unspecified: Secondary | ICD-10-CM | POA: Insufficient documentation

## 2020-03-13 LAB — BLOOD GAS, ARTERIAL
Acid-Base Excess: 0.3 mmol/L (ref 0.0–2.0)
Bicarbonate: 26.9 mmol/L (ref 20.0–28.0)
FIO2: 0.36
O2 Saturation: 90 %
Patient temperature: 37
pCO2 arterial: 51 mmHg — ABNORMAL HIGH (ref 32.0–48.0)
pH, Arterial: 7.33 — ABNORMAL LOW (ref 7.350–7.450)
pO2, Arterial: 63 mmHg — ABNORMAL LOW (ref 83.0–108.0)

## 2020-03-13 LAB — CBC WITH DIFFERENTIAL/PLATELET
Abs Immature Granulocytes: 0.06 10*3/uL (ref 0.00–0.07)
Basophils Absolute: 0 10*3/uL (ref 0.0–0.1)
Basophils Relative: 0 %
Eosinophils Absolute: 0.3 10*3/uL (ref 0.0–0.5)
Eosinophils Relative: 2 %
HCT: 38.8 % (ref 36.0–46.0)
Hemoglobin: 11.9 g/dL — ABNORMAL LOW (ref 12.0–15.0)
Immature Granulocytes: 0 %
Lymphocytes Relative: 11 %
Lymphs Abs: 1.5 10*3/uL (ref 0.7–4.0)
MCH: 25.1 pg — ABNORMAL LOW (ref 26.0–34.0)
MCHC: 30.7 g/dL (ref 30.0–36.0)
MCV: 81.7 fL (ref 80.0–100.0)
Monocytes Absolute: 0.4 10*3/uL (ref 0.1–1.0)
Monocytes Relative: 3 %
Neutro Abs: 12.3 10*3/uL — ABNORMAL HIGH (ref 1.7–7.7)
Neutrophils Relative %: 84 %
Platelets: 258 10*3/uL (ref 150–400)
RBC: 4.75 MIL/uL (ref 3.87–5.11)
RDW: 17.2 % — ABNORMAL HIGH (ref 11.5–15.5)
WBC: 14.7 10*3/uL — ABNORMAL HIGH (ref 4.0–10.5)
nRBC: 0 % (ref 0.0–0.2)

## 2020-03-13 LAB — ETHANOL: Alcohol, Ethyl (B): <10 mg/dL (ref <10)

## 2020-03-13 LAB — COMPREHENSIVE METABOLIC PANEL
ALT: 51 U/L — ABNORMAL HIGH (ref 0–44)
AST: 43 U/L — ABNORMAL HIGH (ref 15–41)
Albumin: 3.9 g/dL (ref 3.5–5.0)
Alkaline Phosphatase: 53 U/L (ref 38–126)
Anion gap: 10 (ref 5–15)
BUN: 14 mg/dL (ref 6–20)
CO2: 23 mmol/L (ref 22–32)
Calcium: 8.8 mg/dL — ABNORMAL LOW (ref 8.9–10.3)
Chloride: 103 mmol/L (ref 98–111)
Creatinine, Ser: 0.67 mg/dL (ref 0.44–1.00)
GFR, Estimated: >60 mL/min (ref >60)
Glucose, Bld: 190 mg/dL — ABNORMAL HIGH (ref 70–99)
Potassium: 3.9 mmol/L (ref 3.5–5.1)
Sodium: 136 mmol/L (ref 135–145)
Total Bilirubin: 0.7 mg/dL (ref 0.3–1.2)
Total Protein: 7.5 g/dL (ref 6.5–8.1)

## 2020-03-13 LAB — HIV ANTIBODY (ROUTINE TESTING W REFLEX): HIV Screen 4th Generation wRfx: NONREACTIVE

## 2020-03-13 LAB — URINE DRUG SCREEN, QUALITATIVE (ARMC ONLY)
Amphetamines, Ur Screen: POSITIVE — AB
Barbiturates, Ur Screen: NOT DETECTED
Benzodiazepine, Ur Scrn: POSITIVE — AB
Cannabinoid 50 Ng, Ur ~~LOC~~: NOT DETECTED
Cocaine Metabolite,Ur ~~LOC~~: NOT DETECTED
MDMA (Ecstasy)Ur Screen: NOT DETECTED
Methadone Scn, Ur: POSITIVE — AB
Opiate, Ur Screen: NOT DETECTED
Phencyclidine (PCP) Ur S: NOT DETECTED
Tricyclic, Ur Screen: POSITIVE — AB

## 2020-03-13 LAB — SARS CORONAVIRUS 2 (TAT 6-24 HRS): SARS Coronavirus 2: NEGATIVE

## 2020-03-13 MED ORDER — ACETAMINOPHEN 325 MG PO TABS
650.0000 mg | ORAL_TABLET | Freq: Four times a day (QID) | ORAL | Status: DC | PRN
  Administered 2020-03-14: 650 mg via ORAL
  Filled 2020-03-13: qty 2

## 2020-03-13 MED ORDER — DM-GUAIFENESIN ER 30-600 MG PO TB12
1.0000 | ORAL_TABLET | Freq: Two times a day (BID) | ORAL | Status: DC | PRN
  Filled 2020-03-13: qty 1

## 2020-03-13 MED ORDER — IPRATROPIUM-ALBUTEROL 0.5-2.5 (3) MG/3ML IN SOLN
3.0000 mL | RESPIRATORY_TRACT | Status: DC
  Administered 2020-03-13 – 2020-03-14 (×6): 3 mL via RESPIRATORY_TRACT
  Filled 2020-03-13 (×7): qty 3

## 2020-03-13 MED ORDER — SODIUM CHLORIDE 0.9 % IV BOLUS (SEPSIS)
1000.0000 mL | Freq: Once | INTRAVENOUS | Status: AC
  Administered 2020-03-13: 1000 mL via INTRAVENOUS

## 2020-03-13 MED ORDER — METHYLPREDNISOLONE SODIUM SUCC 40 MG IJ SOLR
40.0000 mg | Freq: Two times a day (BID) | INTRAMUSCULAR | Status: DC
  Administered 2020-03-13 – 2020-03-14 (×2): 40 mg via INTRAVENOUS
  Filled 2020-03-13 (×2): qty 1

## 2020-03-13 MED ORDER — ONDANSETRON HCL 4 MG/2ML IJ SOLN
4.0000 mg | Freq: Once | INTRAMUSCULAR | Status: AC
  Administered 2020-03-13: 4 mg via INTRAVENOUS
  Filled 2020-03-13: qty 2

## 2020-03-13 MED ORDER — AZITHROMYCIN 500 MG PO TABS
500.0000 mg | ORAL_TABLET | Freq: Every day | ORAL | Status: AC
  Administered 2020-03-13: 500 mg via ORAL
  Filled 2020-03-13: qty 1

## 2020-03-13 MED ORDER — MAGNESIUM SULFATE 2 GM/50ML IV SOLN
2.0000 g | Freq: Once | INTRAVENOUS | Status: AC
  Administered 2020-03-13: 2 g via INTRAVENOUS
  Filled 2020-03-13: qty 50

## 2020-03-13 MED ORDER — IPRATROPIUM-ALBUTEROL 0.5-2.5 (3) MG/3ML IN SOLN
3.0000 mL | Freq: Once | RESPIRATORY_TRACT | Status: AC
  Administered 2020-03-13: 3 mL via RESPIRATORY_TRACT
  Filled 2020-03-13: qty 3

## 2020-03-13 MED ORDER — GABAPENTIN 300 MG PO CAPS
900.0000 mg | ORAL_CAPSULE | Freq: Three times a day (TID) | ORAL | Status: DC
  Administered 2020-03-13 – 2020-03-14 (×2): 900 mg via ORAL
  Filled 2020-03-13 (×2): qty 3

## 2020-03-13 MED ORDER — AZITHROMYCIN 250 MG PO TABS
250.0000 mg | ORAL_TABLET | Freq: Every day | ORAL | Status: DC
  Administered 2020-03-14: 250 mg via ORAL
  Filled 2020-03-13: qty 1

## 2020-03-13 MED ORDER — ONDANSETRON HCL 4 MG PO TABS: 4.0000 mg | ORAL_TABLET | Freq: Four times a day (QID) | ORAL | Status: DC | PRN

## 2020-03-13 MED ORDER — FAMOTIDINE 20 MG PO TABS
20.0000 mg | ORAL_TABLET | Freq: Every day | ORAL | Status: DC
  Administered 2020-03-13 – 2020-03-14 (×2): 20 mg via ORAL
  Filled 2020-03-13 (×2): qty 1

## 2020-03-13 MED ORDER — ALBUTEROL SULFATE (2.5 MG/3ML) 0.083% IN NEBU
5.0000 mg | INHALATION_SOLUTION | Freq: Once | RESPIRATORY_TRACT | Status: AC
  Administered 2020-03-13: 5 mg via RESPIRATORY_TRACT

## 2020-03-13 MED ORDER — NICOTINE 21 MG/24HR TD PT24
21.0000 mg | MEDICATED_PATCH | Freq: Every day | TRANSDERMAL | Status: DC
  Administered 2020-03-13 – 2020-03-14 (×2): 21 mg via TRANSDERMAL
  Filled 2020-03-13 (×2): qty 1

## 2020-03-13 MED ORDER — ALBUTEROL SULFATE (2.5 MG/3ML) 0.083% IN NEBU: 2.5000 mg | INHALATION_SOLUTION | RESPIRATORY_TRACT | Status: DC | PRN

## 2020-03-13 MED ORDER — NALOXONE HCL 2 MG/2ML IJ SOSY: 0.4000 mg | PREFILLED_SYRINGE | Freq: Once | INTRAMUSCULAR | Status: DC

## 2020-03-13 MED ORDER — ENOXAPARIN SODIUM 40 MG/0.4ML ~~LOC~~ SOLN
40.0000 mg | SUBCUTANEOUS | Status: DC
  Administered 2020-03-13: 40 mg via SUBCUTANEOUS
  Filled 2020-03-13: qty 0.4

## 2020-03-13 MED ORDER — HYDROXYZINE HCL 50 MG/ML IM SOLN
25.0000 mg | Freq: Three times a day (TID) | INTRAMUSCULAR | Status: DC | PRN
  Filled 2020-03-13: qty 0.5

## 2020-03-13 NOTE — ED Triage Notes (Signed)
Pt to ED via EMS from home. Pt states her asthma flared up earlier tonight. Pt arrives with expiatory wheezes, pt was given 2 duoneb 1 albuterol and 125 soulmedrol IM with ems.

## 2020-03-13 NOTE — ED Provider Notes (Signed)
[facility] Emergency Department Provider Note  ____________________________________________   Event Date/Time   First MD Initiated Contact with Patient 03/13/20 0302     (approximate)  I have reviewed the triage vital signs and the nursing notes.   HISTORY  Chief Complaint Shortness of Breath    HPI [name] is a 35 y.o. female with history of bipolar disorder, hepatitis C, seizures, asthma, polysubstance abuse who presents to the emergency department with shortness of breath, wheezing that she states started yesterday.  She denies fevers, cough, chest pain.  She does smoke.  Patient very drowsy here.  She denies any drug or alcohol abuse today.  Received breathing treatments with EMS and IM Solu-Medrol.        Past Medical History:  Diagnosis Date  . Anxiety   . Asthma   . Bipolar disorder (HCC)   . Chronic hepatitis C virus infection (HCC) 02/28/2015   Overview:  Diagnosed 12/2014 GT FibroSure 0.03, F 0 fibrosis Tx. Naive  Risks-previous substance use  . Compression fracture of spine (HCC)   . Fibromyalgia   . Headache   . PTSD (post-traumatic stress disorder)   . Seizures (HCC)    Related to benzo withdrawal  . Seizures [facility])     Patient Active Problem List   Diagnosis Date Noted  . Preterm premature rupture of membranes in third trimester 08/11/2019  . Opiate overdose (HCC)   . Polysubstance dependence including opioid type drug, continuous use (HCC) 11/30/2016  . Severe episode of recurrent major depressive disorder, without psychotic features (HCC) 11/25/2016  . History of 3 spontaneous abortions 03/24/2016  . Eczema 03/24/2016  . Depression 03/24/2016  . Asthma without status asthmaticus without complication 03/24/2016  . Opiate abuse, continuous (HCC) 03/24/2016  . Fibromyalgia 04/17/2015  . Chronic migraine 07/23/2014  . Pap smear of cervix with ASCUS, cannot exclude HGSIL 06/08/2013  . Bipolar disorder (HCC) 05/10/2013  .  Generalized anxiety disorder 05/10/2013  . Polysubstance abuse (HCC) 10/03/2012    Past Surgical History:  Procedure Laterality Date  . HAND SURGERY Right   . HAND SURGERY      Prior to Admission medications   Medication Sig Start Date End Date Taking? Authorizing Provider  acetaminophen (TYLENOL) 500 MG tablet Take 2 tablets (1,000 mg total) by mouth every 6 (six) hours as needed (for pain scale < 4). Patient not taking: Reported on 01/05/2020 08/12/19   Gustavo Lah, CNM  albuterol (VENTOLIN HFA) 108 (90 Base) MCG/ACT inhaler Inhale 2 puffs into the lungs every 4 (four) hours as needed for wheezing or shortness of breath. 02/28/20   Cristina Gong, PA-C  famotidine (PEPCID) 20 MG tablet Take 1 tablet (20 mg total) by mouth daily. Patient not taking: Reported on 01/05/2020 08/13/19   Gustavo Lah, CNM  ferrous sulfate 325 (65 FE) MG EC tablet Take 1 tablet (325 mg total) by mouth 2 (two) times daily. Patient not taking: Reported on 08/11/2019 07/30/19 08/29/19  Concha Se, MD  gabapentin (NEURONTIN) 300 MG capsule Take 300 mg by mouth 3 (three) times daily.  Patient not taking: Reported on 09/15/2019    [provider]  hydrOXYzine (ATARAX/VISTARIL) 50 MG tablet Take 1 tablet (50 mg) by mouth four times daily as needed: For anxiety Patient not taking: Reported on 09/15/2019 04/04/17   Armandina Stammer I, NP  ibuprofen (ADVIL) 600 MG tablet Take 1 tablet (600 mg total) by mouth every 6 (six) hours as needed. Patient not  taking: Reported on 01/05/2020 08/12/19   Gustavo Lah, CNM  naloxone [facility] nasal spray 4 mg/0.1 mL Use for overdose of heroin/opioids. Patient not taking: Reported on 01/05/2020 12/25/18   Concha Se, MD    Allergies Patient has no known allergies.  Family History  Family history unknown: Yes    Social History Social History   Tobacco Use  . Smoking status: Current Every Day Smoker    Packs/day: 1.00    Types: Cigarettes  . Smokeless tobacco:  Never Used  . Tobacco comment: Pt does not want to stop smoking at this time  Vaping Use  . Vaping Use: Never used  Substance Use Topics  . Alcohol use: Not Currently  . Drug use: Not Currently    Types: Heroin, Hydrocodone, Benzodiazepines    Review of Systems Level 5 caveat secondary to intoxication.  ____________________________________________   PHYSICAL EXAM:  VITAL SIGNS: ED Triage Vitals  Enc Vitals Group     BP 03/13/20 0309 (!) 140/94     Pulse Rate 03/13/20 0309 (!) 128     Resp 03/13/20 0309 16     Temp 03/13/20 0309 97.9 F (36.6 C)     Temp Source 03/13/20 0309 Oral     SpO2 03/13/20 0309 100 %     Weight 03/13/20 0304 134 lb 14.7 oz (61.2 kg)     Height 03/13/20 0304 5\' 3"  (1.6 m)     Head Circumference --      Peak Flow --      Pain Score 03/13/20 0303 9     Pain Loc --      Pain Edu? --      Excl. in GC? --    CONSTITUTIONAL: Alert and oriented and responds appropriately to questions but quickly becomes very drowsy during questioning and appears intoxicated. HEAD: Normocephalic EYES: Conjunctivae clear, pupils appear equal, EOM appear intact ENT: normal nose; moist mucous membranes NECK: Supple, normal ROM CARD: Giller and tachycardic; S1 and S2 appreciated; no murmurs, no clicks, no rubs, no gallops RESP: Patient is tachypneic.  Speaking full sentences.  No hypoxia receiving treatment at this time.  Diffuse inspiratory and expiratory wheezes.  Diminished at bases bilaterally.  No rhonchi or rales. ABD/GI: Normal bowel sounds; non-distended; soft, non-tender, no rebound, no guarding, no peritoneal signs, no hepatosplenomegaly BACK: The back appears normal EXT: Normal ROM in all joints; no deformity noted, no edema; no cyanosis, no calf tenderness or calf swelling SKIN: Normal color for age and race; warm; no rash on exposed skin NEURO: Moves all extremities equally PSYCH: The patient's mood and manner are  appropriate.  ____________________________________________   LABS (all labs ordered are listed, but only abnormal results are displayed)  Labs Reviewed  CBC WITH DIFFERENTIAL/PLATELET - Abnormal; Notable for the following components:      Result Value   WBC 14.7 (*)    Hemoglobin 11.9 (*)    MCH 25.1 (*)    RDW 17.2 (*)    Neutro Abs 12.3 (*)    All other components within normal limits  COMPREHENSIVE METABOLIC PANEL - Abnormal; Notable for the following components:   Glucose, Bld 190 (*)    Calcium 8.8 (*)    AST 43 (*)    ALT 51 (*)    All other components within normal limits  BLOOD GAS, ARTERIAL - Abnormal; Notable for the following components:   pH, Arterial 7.33 (*)    pCO2 arterial 51 (*)    pO2, Arterial  63 (*)    All other components within normal limits  ETHANOL  URINE DRUG SCREEN, QUALITATIVE ([facility] ONLY)  AMMONIA  POC URINE PREG, ED   ____________________________________________  EKG   EKG Interpretation  Date/Time:  Thursday March 13 2020 03:10:05 EST Ventricular Rate:  133 PR Interval:    QRS Duration: 84 QT Interval:  307 QTC Calculation: 459 R Axis:   73 Text Interpretation: Sinus tachycardia Paired ventricular premature complexes Aberrant conduction of SV complex(es) Borderline repolarization abnormality Borderline ST elevation, lateral leads Confirmed by Rochele Raring [phone]) on 03/13/2020 3:52:49 AM       ____________________________________________  RADIOLOGY Normajean Baxter Feliciano Wynter, personally viewed and evaluated these images (plain radiographs) as part of my medical decision making, as well as reviewing the written report by the radiologist.  ED MD interpretation: Chest x-ray shows no acute abnormality.  Official radiology report(s): DG Chest Portable 1 View  Result Date: 03/13/2020 CLINICAL DATA:  35 year old female with shortness of breath, asthma exacerbation. Smoker. EXAM: PORTABLE CHEST 1 VIEW COMPARISON:  Chest CTA 02/28/2020 and  earlier. FINDINGS: Portable AP upright view at 0349 hours. Lung volumes and mediastinal contours remain within normal limits. Visualized tracheal air column is within normal limits. Lung markings appear stable, with mild diffuse increased interstitium which could be related to smoking. Otherwise when allowing for portable technique the lungs are clear. No pneumothorax or pleural effusion. Chronic left rib fractures. Incidental bilateral 1st rib region dermal piercings. No acute osseous abnormality identified. Paucity of bowel gas in the upper abdomen. IMPRESSION: No acute cardiopulmonary abnormality. Electronically Signed   By: Odessa Fleming M.D.   On: 03/13/2020 04:17    ____________________________________________   PROCEDURES  Procedure(s) performed (including Critical Care):  Procedures  CRITICAL CARE Performed by: Baxter Hire Adrielle Polakowski   Total critical care time: 65 minutes  Critical care time was exclusive of separately billable procedures and treating other patients.  Critical care was necessary to treat or prevent imminent or life-threatening deterioration.  Critical care was time spent personally by me on the following activities: development of treatment plan with patient and/or surrogate as well as nursing, discussions with consultants, evaluation of patient's response to treatment, examination of patient, obtaining history from patient or surrogate, ordering and performing treatments and interventions, ordering and review of laboratory studies, ordering and review of radiographic studies, pulse oximetry and re-evaluation of patient's condition.  ____________________________________________   INITIAL IMPRESSION / ASSESSMENT AND PLAN / ED COURSE  As part of my medical decision making, I reviewed the following data within the electronic MEDICAL RECORD NUMBER History obtained from family, Nursing notes reviewed and incorporated, Labs reviewed, EKG interpreted tachycardia, Radiograph reviewed, Notes  from prior ED visits and [place] Controlled Substance Database         Patient here with asthma exacerbation.  She is also extremely drowsy and falls asleep during questioning.  This could be due to CO2 retention but I suspect it is due to her history of substance abuse.  Have ordered Narcan but once overhearing that she may receive Narcan in the ED patient becomes very upset and refuses this medication.  We will continue to monitor closely but if she becomes more unstable, will give Narcan in the ED.  Will obtain labs, chest x-ray.  Have ordered breathing treatments and IV magnesium.     4:10 AM  Pt more awake and has become increasingly argumentative.  Patient states that we get "1 more try" to get blood work from her.  Have explained to  her why I feel she needs a blood gas and lab work drawn today.  She states "I just need albuterol inhaler".  Discussed with patient that she has received now for nebulizer treatments and her lungs still sound poor with diminished aeration.  I discussed with patient that I feel she needs a further work-up, treatment and may need admission to the hospital.  She agrees to stay at this time and allow [place] to again try for labs, ABG.  6:05 AM  Pt still resting comfortably.  Currently off oxygen satting 95% on room air.  She still has diffuse inspiratory and expiratory wheezes after 3 DuoNeb treatments, 2 albuterol treatments, Solu-Medrol and magnesium.  I feel she needs admission.  She is comfortable with this plan and agrees to stay for further treatment.   6:42 AM Discussed patient's case with hospitalist, Dr. Arville Care.  I have recommended admission and patient (and family if present) agree with this plan. Admitting physician will place admission orders.   I reviewed all nursing notes, vitals, pertinent previous records and reviewed/interpreted all EKGs, lab and urine results, imaging (as available).   ____________________________________________   FINAL CLINICAL  IMPRESSION(S) / ED DIAGNOSES  Final diagnoses:  Exacerbation of intermittent asthma, unspecified asthma severity  Substance abuse [facility])     ED Discharge Orders    None      *Please note:  Kathy [name] was evaluated in Emergency Department on 03/13/2020 for the symptoms described in the history of present illness. She was evaluated in the context of the global COVID-19 pandemic, which necessitated consideration that the patient might be at risk for infection with the SARS-CoV-2 virus that causes COVID-19. Institutional protocols and algorithms that pertain to the evaluation of patients at risk for COVID-19 are in a state of rapid change based on information released by regulatory bodies including the CDC and federal and state organizations. These policies and algorithms were followed during the patient's care in the ED.  Some ED evaluations and interventions may be delayed as a result of limited staffing during and the pandemic.*   Note:  This document was prepared using Dragon voice recognition software and may include unintentional dictation errors.   Manpreet Kemmer, Layla Maw, DO 03/13/20 [phone]

## 2020-03-13 NOTE — ED Notes (Signed)
Mansy, MD contacted due to pt stating she takes 900mg  gabapentin TID. Per secure chat by , MD RN to put in order for Ar[facility] Care Gabapentin PO TID.

## 2020-03-13 NOTE — H&P (Addendum)
History and Physical    Kathy [name] FMB:[id number] DOB: [birth year] DOA: 03/13/2020  Referring MD/NP/PA:   PCP: Kathy [name], No Pcp Per   Kathy [name] coming from:  The Kathy [name] is coming from home.  At baseline, pt is independent for most of ADL.        Chief Complaint: Shortness of breath, wheezing  HPI: Kathy [name] is a 35 y.o. female with medical history significant of asthma, GERD, depression, anxiety, seizure due to benzo withdrawal, PTSD, bipolar disorder, polysubstance abuse, tobacco abuse, chronic migraine, fibromyalgia, iron deficiency anemia, who presents with shortness breath, wheezing.  Kathy [name] states that she started having shortness breath since last night, which has been progressively worsening.  Kathy [name] has dry cough, wheezing, no chest pain, fever or chills.  Kathy [name] denies nausea, vomiting, diarrhea, abdominal symptoms of UTI.  No unilateral weakness.  Per ED physician, Kathy [name] was drowsy initially, but when I saw Kathy [name] in the ED, she is alert, oriented x3.  Her mental status is normal. Pt received 0.4 mg of narcan in ED.   ED Course: pt was found to have pending COVID-19 PCR, WBC 14.7, alcohol level less than 10, electrolytes renal function okay, temperature normal, blood pressure 120/74, heart rate 128, 117, RR 26, oxygen saturation 91% on room air, which improved to 100% on 4 L oxygen.  ABG with pH 7.33, CO2 51, O2 63. chest x-ray negative.  Pt is admitted as inpatient by accepting MD.  Review of Systems:   General: no fevers, chills, no body weight gain, has fatigue HEENT: no blurry vision, hearing changes or sore throat Respiratory: has dyspnea, coughing, wheezing CV: no chest pain, no palpitations GI: no nausea, vomiting, abdominal pain, diarrhea, constipation GU: no dysuria, burning on urination, increased urinary frequency, hematuria  Ext: no leg edema Neuro: no unilateral weakness, numbness, or tingling, no vision change or hearing loss Skin: no rash, no skin  tear. MSK: No muscle spasm, no deformity, no limitation of range of movement in spin Heme: No easy bruising.  Travel history: No recent long distant travel.  Allergy: No Known Allergies  Past Medical History:  Diagnosis Date  . Anxiety   . Asthma   . Bipolar disorder (HCC)   . Chronic hepatitis C virus infection (HCC) 02/28/2015   Overview:  Diagnosed 12/2014 GT FibroSure 0.03, F 0 fibrosis Tx. Naive  Risks-previous substance use  . Compression fracture of spine (HCC)   . Fibromyalgia   . Headache   . PTSD (post-traumatic stress disorder)   . Seizures (HCC)    Related to benzo withdrawal  . Seizures (HCC)     Past Surgical History:  Procedure Laterality Date  . HAND SURGERY Right   . HAND SURGERY      Social History:  reports that she has been smoking cigarettes. She has been smoking about 1.00 pack per day. She has never used smokeless tobacco. She reports previous alcohol use. She reports previous drug use. Drugs: Heroin, Hydrocodone, and Benzodiazepines.  Family History:  Family History  Problem Relation Age of Onset  . Heart disease Father      Prior to Admission medications   Medication Sig Start Date End Date Taking? Authorizing Provider  acetaminophen (TYLENOL) 500 MG tablet Take 2 tablets (1,000 mg total) by mouth every 6 (six) hours as needed (for pain scale < 4). Kathy [name] not taking: Reported on 01/05/2020 08/12/19   Gustavo Lah, CNM  albuterol (VENTOLIN HFA) 108 [phone] Base) MCG/ACT inhaler Inhale 2 puffs into the lungs every  4 (four) hours as needed for wheezing or shortness of breath. 02/28/20   Cristina Gong, PA-C  famotidine (PEPCID) 20 MG tablet Take 1 tablet (20 mg total) by mouth daily. Kathy [name] not taking: Reported on 01/05/2020 08/13/19   Gustavo Lah, CNM  ferrous sulfate 325 (65 FE) MG EC tablet Take 1 tablet (325 mg total) by mouth 2 (two) times daily. Kathy [name] not taking: Reported on 08/11/2019 07/30/19 08/29/19  Concha Se, MD  gabapentin  (NEURONTIN) 300 MG capsule Take 300 mg by mouth 3 (three) times daily.  Kathy [name] not taking: Reported on 09/15/2019    [provider]  hydrOXYzine (ATARAX/VISTARIL) 50 MG tablet Take 1 tablet (50 mg) by mouth four times daily as needed: For anxiety Kathy [name] not taking: Reported on 09/15/2019 04/04/17   Armandina Stammer I, NP  ibuprofen (ADVIL) 600 MG tablet Take 1 tablet (600 mg total) by mouth every 6 (six) hours as needed. Kathy [name] not taking: Reported on 01/05/2020 08/12/19   Gustavo Lah, CNM  naloxone [facility]) nasal spray 4 mg/0.1 mL Use for overdose of heroin/opioids. Kathy [name] not taking: Reported on 01/05/2020 12/25/18   Concha Se, MD    Physical Exam: Vitals:   03/13/20 1023 03/13/20 1100 03/13/20 1300 03/13/20 1400  BP: 116/70 114/74 126/73 139/85  Pulse: (!) 119 (!) 109 (!) 104 (!) 103  Resp:  20 13 16   Temp:      TempSrc:      SpO2: 93% 95% 94% 90%  Weight:      Height:       General: Not in acute distress HEENT:       Eyes: PERRL, EOMI, no scleral icterus.       ENT: No discharge from the ears and nose, no pharynx injection, no tonsillar enlargement.        Neck: No JVD, no bruit, no mass felt. Heme: No neck lymph node enlargement. Cardiac: S1/S2, RRR, No murmurs, No gallops or rubs. Respiratory: Has wheezing bilaterally GI: Soft, nondistended, nontender, no rebound pain, no organomegaly, BS present. GU: No hematuria Ext: No pitting leg edema bilaterally. 1+DP/PT pulse bilaterally. Musculoskeletal: No joint deformities, No joint redness or warmth, no limitation of ROM in spin. Skin: No rashes.  Neuro: Alert, oriented X3, cranial nerves II-XII grossly intact, moves all extremities normally.  Psych: Kathy [name] is not psychotic, no suicidal or hemocidal ideation.  Labs on Admission: I have personally reviewed following labs and imaging studies  CBC: Recent Labs  Lab 03/13/20 0413  WBC 14.7*  NEUTROABS 12.3*  HGB 11.9*  HCT 38.8  MCV 81.7  PLT 258   Basic  Metabolic Panel: Recent Labs  Lab 03/13/20 0413  NA 136  K 3.9  CL 103  CO2 23  GLUCOSE 190*  BUN 14  CREATININE 0.67  CALCIUM 8.8*   GFR: Estimated Creatinine Clearance: 81.2 mL/min (by C-G formula based on SCr of 0.67 mg/dL). Liver Function Tests: Recent Labs  Lab 03/13/20 0413  AST 43*  ALT 51*  ALKPHOS 53  BILITOT 0.7  PROT 7.5  ALBUMIN 3.9   No results for input(s): LIPASE, AMYLASE in the last 168 hours. No results for input(s): AMMONIA in the last 168 hours. Coagulation Profile: No results for input(s): INR, PROTIME in the last 168 hours. Cardiac Enzymes: No results for input(s): CKTOTAL, CKMB, CKMBINDEX, TROPONINI in the last 168 hours. BNP (last 3 results) No results for input(s): PROBNP in the last 8760 hours. HbA1C: No results for input(s): HGBA1C in  the last 72 hours. CBG: No results for input(s): GLUCAP in the last 168 hours. Lipid Profile: No results for input(s): CHOL, HDL, LDLCALC, TRIG, CHOLHDL, LDLDIRECT in the last 72 hours. Thyroid Function Tests: No results for input(s): TSH, T4TOTAL, FREET4, T3FREE, THYROIDAB in the last 72 hours. Anemia Panel: No results for input(s): VITAMINB12, FOLATE, FERRITIN, TIBC, IRON, RETICCTPCT in the last 72 hours. Urine analysis:    Component Value Date/Time   COLORURINE AMBER (A) 08/11/2019 1136   APPEARANCEUR HAZY (A) 08/11/2019 1136   APPEARANCEUR Cloudy 01/07/2012 2032   LABSPEC 1.026 08/11/2019 1136   LABSPEC 1.030 01/07/2012 2032   PHURINE 6.0 08/11/2019 1136   GLUCOSEU NEGATIVE 08/11/2019 1136   GLUCOSEU Negative 01/07/2012 2032   HGBUR MODERATE (A) 08/11/2019 1136   BILIRUBINUR NEGATIVE 08/11/2019 1136   BILIRUBINUR Negative 01/07/2012 2032   KETONESUR NEGATIVE 08/11/2019 1136   PROTEINUR 100 (A) 08/11/2019 1136   NITRITE NEGATIVE 08/11/2019 1136   LEUKOCYTESUR SMALL (A) 08/11/2019 1136   LEUKOCYTESUR 2+ 01/07/2012 2032   Sepsis Labs: @LABRCNTIP (procalcitonin:4,lacticidven:4) )No results found  for this or any previous visit (from the past 240 hour(s)).   Radiological Exams on Admission: DG Chest Portable 1 View  Result Date: 03/13/2020 CLINICAL DATA:  35 year old female with shortness of breath, asthma exacerbation. Smoker. EXAM: PORTABLE CHEST 1 VIEW COMPARISON:  Chest CTA 02/28/2020 and earlier. FINDINGS: Portable AP upright view at 0349 hours. Lung volumes and mediastinal contours remain within normal limits. Visualized tracheal air column is within normal limits. Lung markings appear stable, with mild diffuse increased interstitium which could be related to smoking. Otherwise when allowing for portable technique the lungs are clear. No pneumothorax or pleural effusion. Chronic left rib fractures. Incidental bilateral 1st rib region dermal piercings. No acute osseous abnormality identified. Paucity of bowel gas in the upper abdomen. IMPRESSION: No acute cardiopulmonary abnormality. Electronically Signed   By: 03/01/2020 M.D.   On: 03/13/2020 04:17     EKG: I have personally reviewed.  Sinus rhythm, tachycardia, QTC 459, nonspecific T wave change  Assessment/Plan Principal Problem:   Asthma exacerbation Active Problems:   Polysubstance abuse (HCC)   Depression with anxiety   GERD (gastroesophageal reflux disease)   Tobacco abuse   Asthma exacerbation: CXR negative. No fever.  Kathy [name] has cough, wheezing, shortness breath, clinically consistent with asthma exacerbation.  Since Kathy [name] is smoker, Kathy [name] may also have component of undiagnosed COPD. She has leukocytosis WBC 14.7.  We started empiric antibiotics Z-Pak  -Pt is admitted as inpatient by accepting MD -Bronchodilators -Solu-Medrol 60 mg IV bid -Z pak  -Mucinex for cough  -Nasal cannula oxygen as needed to maintain O2 saturation 93% or greater  Polysubstance abuse (HCC) and Tobacco abuse -check UDS -nicotine patch  Depression with anxiety: Kathy [name] is not taking medications currently -observe closely -As needed  hydroxyzine for anxiety  GERD (gastroesophageal reflux disease) -pepcid         DVT ppx: SQ Lovenox Code Status: Full code Family Communication: not done, no family member is at bed side.     Disposition Plan:  Anticipate discharge back to previous environment Consults called:  none Admission status and Level of care: Med-Surg as inpt        Status is: Inpatient  Remains inpatient appropriate because:Inpatient level of care appropriate due to severity of illness.  Kathy [name] has multiple comorbidities, now presents with asthma exacerbation, oxygen desaturation to 91% on room air, currently on 4 L oxygen.  Her presentation is highly complicated.  Kathy [name] is at high risk of deteriorating.  Need to be treated in hospital for at least 2 days.   Dispo: The Kathy [name] is from: Home              Anticipated d/c is to: Home              Anticipated d/c date is: 2 days              Kathy [name] currently is not medically stable to d/c.   Difficult to place Kathy [name] No          Date of Service 03/13/2020    Lorretta Harp [facility]   If 7PM-7AM, please contact night-coverage [email or web address] 03/13/2020, 2:09 PM

## 2020-03-14 ENCOUNTER — Encounter: Payer: Self-pay | Admitting: Internal Medicine

## 2020-03-14 DIAGNOSIS — K219 Gastro-esophageal reflux disease without esophagitis: Secondary | ICD-10-CM

## 2020-03-14 MED ORDER — IPRATROPIUM-ALBUTEROL 0.5-2.5 (3) MG/3ML IN SOLN: 3.0000 mL | Freq: Four times a day (QID) | RESPIRATORY_TRACT | 10 refills | Status: AC | PRN

## 2020-03-14 MED ORDER — PREDNISONE 10 MG (21) PO TBPK: ORAL_TABLET | ORAL | 0 refills | Status: AC

## 2020-03-14 MED ORDER — ALBUTEROL SULFATE HFA 108 (90 BASE) MCG/ACT IN AERS
1.0000 | INHALATION_SPRAY | RESPIRATORY_TRACT | Status: DC | PRN
  Filled 2020-03-14: qty 6.7

## 2020-03-14 MED ORDER — DM-GUAIFENESIN ER 30-600 MG PO TB12
1.0000 | ORAL_TABLET | Freq: Two times a day (BID) | ORAL | 0 refills | Status: AC | PRN
Start: 2020-03-14 — End: ?

## 2020-03-14 MED ORDER — IPRATROPIUM-ALBUTEROL 20-100 MCG/ACT IN AERS: 2.0000 | INHALATION_SPRAY | Freq: Four times a day (QID) | RESPIRATORY_TRACT | 1 refills | Status: AC | PRN

## 2020-03-14 MED ORDER — NICOTINE 21 MG/24HR TD PT24: 21.0000 mg | MEDICATED_PATCH | Freq: Every day | TRANSDERMAL | 0 refills | Status: AC

## 2020-03-14 MED ORDER — IPRATROPIUM-ALBUTEROL 20-100 MCG/ACT IN AERS
2.0000 | INHALATION_SPRAY | Freq: Four times a day (QID) | RESPIRATORY_TRACT | Status: DC | PRN
  Filled 2020-03-14: qty 4

## 2020-03-14 MED ORDER — FAMOTIDINE 20 MG PO TABS
20.0000 mg | ORAL_TABLET | Freq: Every day | ORAL | 0 refills | Status: AC
Start: 2020-03-15 — End: ?

## 2020-03-14 MED ORDER — AZITHROMYCIN 250 MG PO TABS: 250.0000 mg | ORAL_TABLET | Freq: Every day | ORAL | 0 refills | Status: AC

## 2020-03-14 NOTE — Plan of Care (Signed)
Care plan generated

## 2020-03-14 NOTE — Progress Notes (Signed)
Patient signed authorization papers for [facility] to release medical records to [facility]. Authorization forms placed in patients medical record.

## 2020-03-16 NOTE — Discharge Summary (Signed)
Physician Discharge Summary  Kathy [name] LZJ:[id number] DOB: [birth year] DOA: 03/13/2020  PCP: Patient, No Pcp Per  Admit date: 03/13/2020 Discharge date: 03/14/2020  Admitted From: Home Disposition:  Home  Recommendations for Outpatient Follow-up:  1. Follow up with PCP in 1-2 weeks 2. Please obtain BMP/CBC in one week   Discharge Condition:Stable CODE STATUS: full code.  Diet recommendation: Heart Healthy   Brief/Interim Summary: Kathy [name] is a 35 y.o. female with medical history significant of asthma, GERD, depression, anxiety, seizure due to benzo withdrawal, PTSD, bipolar disorder, polysubstance abuse, tobacco abuse, chronic migraine, fibromyalgia, iron deficiency anemia, who presents with shortness breath, wheezing. She was admitted for acute asthma exacerbation.   Discharge Diagnoses:  Principal Problem:   Asthma exacerbation Active Problems:   Polysubstance abuse ([place])   Depression with anxiety   GERD (gastroesophageal reflux disease)   Tobacco abuse  Acute asthma exacerbation:  wheezing resolved. Still some coughing, sob has improved.  Discharged on prednisone and duonebs.   Depression and anxiety  Recommend outpatient follow up with PCP .    gerd stable.   Discharge Instructions  Discharge Instructions    Diet - low sodium heart healthy   Complete by: As directed    Discharge instructions   Complete by: As directed    Please follow up with PCP in one week.   Increase activity slowly   Complete by: As directed      Allergies as of 03/14/2020   No Known Allergies     Medication List    STOP taking these medications   naloxone 4 MG/0.1ML Liqd nasal spray kit Commonly known as: NARCAN     TAKE these medications   albuterol 108 (90 Base) MCG/ACT inhaler Commonly known as: VENTOLIN HFA Inhale 2 puffs into the lungs every 4 (four) hours as needed for wheezing or shortness of breath.   azithromycin 250 MG tablet Commonly known as:  ZITHROMAX Take 1 tablet (250 mg total) by mouth daily.   dextromethorphan-guaiFENesin 30-600 MG 12hr tablet Commonly known as: MUCINEX DM Take 1 tablet by mouth 2 (two) times daily as needed for cough.   famotidine 20 MG tablet Commonly known as: PEPCID Take 1 tablet (20 mg total) by mouth daily.   Ipratropium-Albuterol 20-100 MCG/ACT Aers respimat Commonly known as: COMBIVENT Inhale 2 puffs into the lungs every 6 (six) hours as needed for wheezing. For asthma exacerbation.   ipratropium-albuterol 0.5-2.5 (3) MG/3ML Soln Commonly known as: DUONEB Take 3 mLs by nebulization every 6 (six) hours as needed.   nicotine 21 mg/24hr patch Commonly known as: NICODERM CQ - dosed in mg/24 hours Place 1 patch (21 mg total) onto the skin daily.   predniSONE 10 MG (21) Tbpk tablet Commonly known as: STERAPRED UNI-PAK 21 TAB Prednisone 40 mg daily for 7 days.       No Known Allergies  Consultations:  None.    Procedures/Studies: DG Chest 2 View  Result Date: 02/28/2020 CLINICAL DATA:  Shortness of breath.  History of asthma. EXAM: CHEST - 2 VIEW COMPARISON:  January 12, 2020. FINDINGS: The heart size and mediastinal contours are within normal limits. No consolidation. Similar hyperinflation and central bronchial wall thickening. No visible pleural effusions or pneumothorax. IMPRESSION: Similar hyperinflation and central bronchial wall thickening, possibly related to reactive airways disease or bronchiolitis. No consolidation. Electronically Signed   By: Margaretha Sheffield MD   On: 02/28/2020 16:12   CT Angio Chest PE W/Cm &/Or Wo Cm  Result Date: 02/28/2020 CLINICAL DATA:  Hypoxia EXAM: CT ANGIOGRAPHY CHEST WITH CONTRAST TECHNIQUE: Multidetector CT imaging of the chest was performed using the standard protocol during bolus administration of intravenous contrast. Multiplanar CT image reconstructions and MIPs were obtained to evaluate the vascular anatomy. CONTRAST:  57m OMNIPAQUE IOHEXOL  350 MG/ML SOLN COMPARISON:  None. FINDINGS: Cardiovascular: Heart is normal size. Aorta is normal caliber. No filling defects in the pulmonary arteries to suggest pulmonary emboli. Mediastinum/Nodes: No mediastinal, hilar, or axillary adenopathy. Trachea and esophagus are unremarkable. Thyroid unremarkable. Lungs/Pleura: No confluent opacities or effusions. Upper Abdomen: Diffuse fatty infiltration of the liver suspected. Musculoskeletal: Chest wall soft tissues are unremarkable. No acute bony abnormality. Review of the MIP images confirms the above findings. IMPRESSION: No evidence of pulmonary embolus. No acute cardiopulmonary disease. Suspect hepatic steatosis. Electronically Signed   By: KRolm BaptiseM.D.   On: 02/28/2020 21:17   DG Chest Portable 1 View  Result Date: 03/13/2020 CLINICAL DATA:  35year old female with shortness of breath, asthma exacerbation. Smoker. EXAM: PORTABLE CHEST 1 VIEW COMPARISON:  Chest CTA 02/28/2020 and earlier. FINDINGS: Portable AP upright view at 0349 hours. Lung volumes and mediastinal contours remain within normal limits. Visualized tracheal air column is within normal limits. Lung markings appear stable, with mild diffuse increased interstitium which could be related to smoking. Otherwise when allowing for portable technique the lungs are clear. No pneumothorax or pleural effusion. Chronic left rib fractures. Incidental bilateral 1st rib region dermal piercings. No acute osseous abnormality identified. Paucity of bowel gas in the upper abdomen. IMPRESSION: No acute cardiopulmonary abnormality. Electronically Signed   By: HGenevie AnnM.D.   On: 03/13/2020 04:17    None.    Subjective: No new complaints.   Discharge Exam: Vitals:   03/14/20 0845 03/14/20 1102  BP: (!) 92/57   Pulse: 100   Resp: 18   Temp: 98.2 F (36.8 C)   SpO2: 95% 98%   Vitals:   03/14/20 0007 03/14/20 0400 03/14/20 0845 03/14/20 1102  BP: 124/80 109/65 (!) 92/57   Pulse: 85 (!) 102 100    Resp: '18 20 18   ' Temp: 98.1 F (36.7 C) 98.2 F (36.8 C) 98.2 F (36.8 C)   TempSrc: Oral     SpO2: 100% 93% 95% 98%  Weight:      Height:        General: Pt is alert, awake, not in acute distress Cardiovascular: RRR, S1/S2 +, no rubs, no gallops Respiratory: CTA bilaterally, no wheezing, no rhonchi Abdominal: Soft, NT, ND, bowel sounds + Extremities: no edema, no cyanosis    The results of significant diagnostics from this hospitalization (including imaging, microbiology, ancillary and laboratory) are listed below for reference.     Microbiology: Recent Results (from the past 240 hour(s))  SARS CORONAVIRUS 2 (TAT 6-24 HRS) Nasopharyngeal Nasopharyngeal Swab     Status: None   Collection Time: 03/13/20  6:07 AM   Specimen: Nasopharyngeal Swab  Result Value Ref Range Status   SARS Coronavirus 2 NEGATIVE NEGATIVE Final    Comment: (NOTE) SARS-CoV-2 target nucleic acids are NOT DETECTED.  The SARS-CoV-2 RNA is generally detectable in upper and lower respiratory specimens during the acute phase of infection. Negative results do not preclude SARS-CoV-2 infection, do not rule out co-infections with other pathogens, and should not be used as the sole basis for treatment or other patient management decisions. Negative results must be combined with clinical observations, patient history, and epidemiological information. The expected result is Negative.  Fact Sheet for  Patients: SugarRoll.be  Fact Sheet for Healthcare Providers: https://www.woods-mathews.com/  This test is not yet approved or cleared by the [place] FDA and  has been authorized for detection and/or diagnosis of SARS-CoV-2 by FDA under an Emergency Use Authorization (EUA). This EUA will remain  in effect (meaning this test can be used) for the duration of the COVID-19 declaration under Se ction 564(b)(1) of the Act, 21 U.S.C. section 360bbb-3(b)(1), unless the  authorization is terminated or revoked sooner.  Performed at [facility], [place] [address], [place], [place] [address]   Culture, blood (routine x 2) Call MD if unable to obtain prior to antibiotics being given     Status: None (Preliminary result)   Collection Time: 03/13/20  9:18 AM   Specimen: BLOOD  Result Value Ref Range Status   Specimen Description BLOOD LEFT [facility]  Final   Special Requests   Final    BOTTLES DRAWN AEROBIC AND ANAEROBIC Blood Culture adequate volume   Culture   Final    NO GROWTH 3 DAYS Performed at [facility], [address] [place], [place] [address]    Report Status PENDING  Incomplete  Culture, blood (routine x 2) Call MD if unable to obtain prior to antibiotics being given     Status: None (Preliminary result)   Collection Time: 03/13/20  9:18 AM   Specimen: BLOOD  Result Value Ref Range Status   Specimen Description BLOOD RIGHT HAND  Final   Special Requests   Final    BOTTLES DRAWN AEROBIC AND ANAEROBIC Blood Culture adequate volume   Culture   Final    NO GROWTH 3 DAYS Performed at [facility], [address] [place], [place] [address]    Report Status PENDING  Incomplete     Labs: BNP (last 3 results) No results for input(s): BNP in the last 8760 hours. Basic Metabolic Panel: Recent Labs  Lab 03/13/20 0413  NA 136  K 3.9  CL 103  CO2 23  GLUCOSE 190*  BUN 14  CREATININE 0.67  CALCIUM 8.8*   Liver Function Tests: Recent Labs  Lab 03/13/20 0413  AST 43*  ALT 51*  ALKPHOS 53  BILITOT 0.7  PROT 7.5  ALBUMIN 3.9   No results for input(s): LIPASE, AMYLASE in the last 168 hours. No results for input(s): AMMONIA in the last 168 hours. CBC: Recent Labs  Lab 03/13/20 0413  WBC 14.7*  NEUTROABS 12.3*  HGB 11.9*  HCT 38.8  MCV 81.7  PLT 258   Cardiac Enzymes: No results for input(s): CKTOTAL, CKMB, CKMBINDEX, TROPONINI in the last 168 hours. BNP: Invalid input(s): POCBNP CBG: No results for  input(s): GLUCAP in the last 168 hours. D-Dimer No results for input(s): DDIMER in the last 72 hours. Hgb A1c No results for input(s): HGBA1C in the last 72 hours. Lipid Profile No results for input(s): CHOL, HDL, LDLCALC, TRIG, CHOLHDL, LDLDIRECT in the last 72 hours. Thyroid function studies No results for input(s): TSH, T4TOTAL, T3FREE, THYROIDAB in the last 72 hours.  Invalid input(s): FREET3 Anemia work up No results for input(s): VITAMINB12, FOLATE, FERRITIN, TIBC, IRON, RETICCTPCT in the last 72 hours. Urinalysis    Component Value Date/Time   COLORURINE AMBER (A) 08/11/2019 1136   APPEARANCEUR HAZY (A) 08/11/2019 1136   APPEARANCEUR Cloudy 01/07/2012 2032   LABSPEC 1.026 08/11/2019 1136   LABSPEC 1.030 01/07/2012 2032   PHURINE 6.0 08/11/2019 1136   GLUCOSEU NEGATIVE 08/11/2019 1136   GLUCOSEU Negative  01/07/2012 2032   HGBUR MODERATE (A) 08/11/2019 1136   BILIRUBINUR NEGATIVE 08/11/2019 1136   BILIRUBINUR Negative 01/07/2012 2032   [place] 08/11/2019 1136   PROTEINUR 100 (A) 08/11/2019 1136   NITRITE NEGATIVE 08/11/2019 1136   LEUKOCYTESUR SMALL (A) 08/11/2019 1136   LEUKOCYTESUR 2+ 01/07/2012 2032   Sepsis Labs Invalid input(s): PROCALCITONIN,  WBC,  LACTICIDVEN Microbiology Recent Results (from the past 240 hour(s))  SARS CORONAVIRUS 2 (TAT 6-24 HRS) Nasopharyngeal Nasopharyngeal Swab     Status: None   Collection Time: 03/13/20  6:07 AM   Specimen: Nasopharyngeal Swab  Result Value Ref Range Status   SARS Coronavirus 2 NEGATIVE NEGATIVE Final    Comment: (NOTE) SARS-CoV-2 target nucleic acids are NOT DETECTED.  The SARS-CoV-2 RNA is generally detectable in upper and lower respiratory specimens during the acute phase of infection. Negative results do not preclude SARS-CoV-2 infection, do not rule out co-infections with other pathogens, and should not be used as the sole basis for treatment or other patient management decisions. Negative results  must be combined with clinical observations, patient history, and epidemiological information. The expected result is Negative.  Fact Sheet for Patients: SugarRoll.be  Fact Sheet for Healthcare Providers: https://www.woods-mathews.com/  This test is not yet approved or cleared by the [place] FDA and  has been authorized for detection and/or diagnosis of SARS-CoV-2 by FDA under an Emergency Use Authorization (EUA). This EUA will remain  in effect (meaning this test can be used) for the duration of the COVID-19 declaration under Se ction 564(b)(1) of the Act, 21 U.S.C. section 360bbb-3(b)(1), unless the authorization is terminated or revoked sooner.  Performed at [facility], [place] [address], [place], [place] [address]   Culture, blood (routine x 2) Call MD if unable to obtain prior to antibiotics being given     Status: None (Preliminary result)   Collection Time: 03/13/20  9:18 AM   Specimen: BLOOD  Result Value Ref Range Status   Specimen Description BLOOD LEFT [facility]  Final   Special Requests   Final    BOTTLES DRAWN AEROBIC AND ANAEROBIC Blood Culture adequate volume   Culture   Final    NO GROWTH 3 DAYS Performed at [facility], [address] [place], [place] [address]    Report Status PENDING  Incomplete  Culture, blood (routine x 2) Call MD if unable to obtain prior to antibiotics being given     Status: None (Preliminary result)   Collection Time: 03/13/20  9:18 AM   Specimen: BLOOD  Result Value Ref Range Status   Specimen Description BLOOD RIGHT HAND  Final   Special Requests   Final    BOTTLES DRAWN AEROBIC AND ANAEROBIC Blood Culture adequate volume   Culture   Final    NO GROWTH 3 DAYS Performed at [facility], [address], [place], [place] [address]    Report Status PENDING  Incomplete     Time coordinating discharge: 33 minutes.   SIGNED:   Hosie Poisson, MD  [facility]

## 2020-03-18 LAB — CULTURE, BLOOD (ROUTINE X 2)
Culture: NO GROWTH
Culture: NO GROWTH
Special Requests: ADEQUATE
Special Requests: ADEQUATE

## 2021-03-03 IMAGING — US US OB COMP +14 WK
1 of 2 series · 13 of 28 positions shown · non-contrast
Comparison: none

CLINICAL DATA: No prenatal care.  Substance abuse.

EXAM:
OBSTETRICAL ULTRASOUND >14 WKS

[Series 1: us ob comp + 14 wk · 13 of 56 slices shown]
[im 3/56]
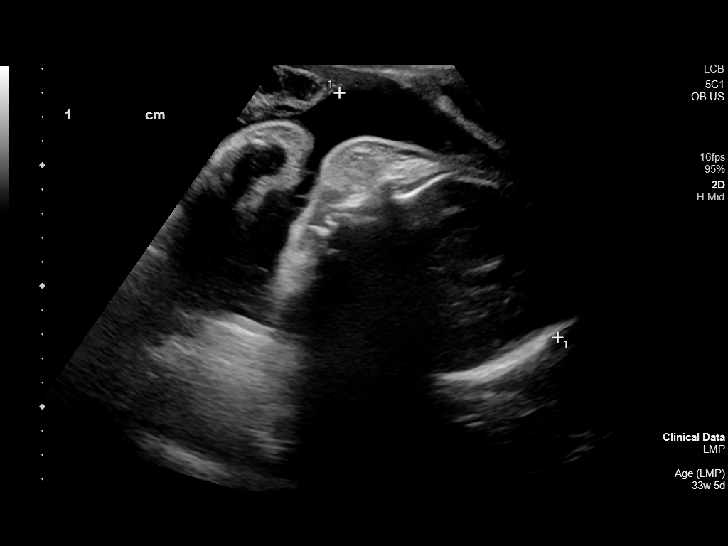
[im 7/56]
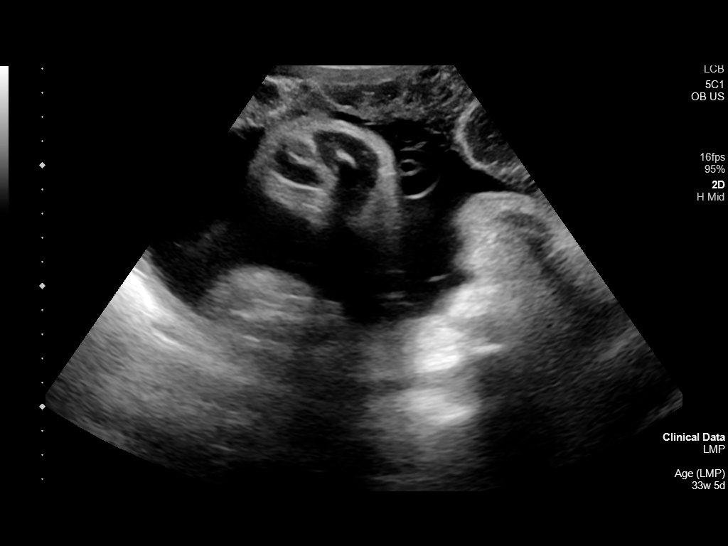
[im 11/56]
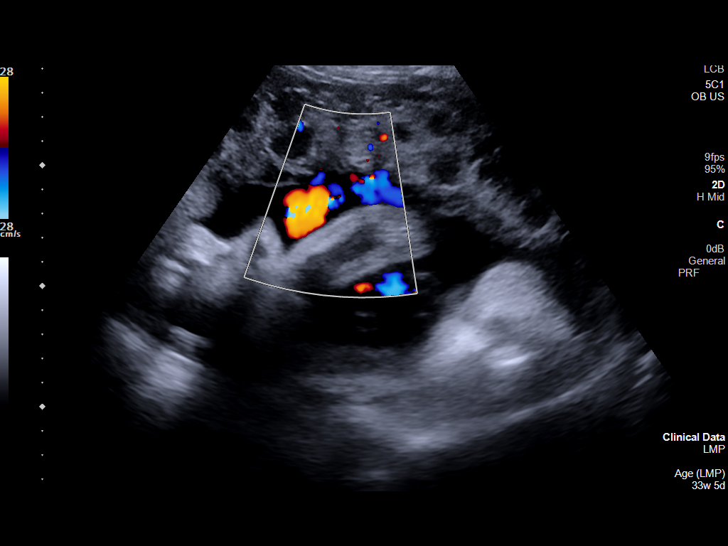
[im 15/56]
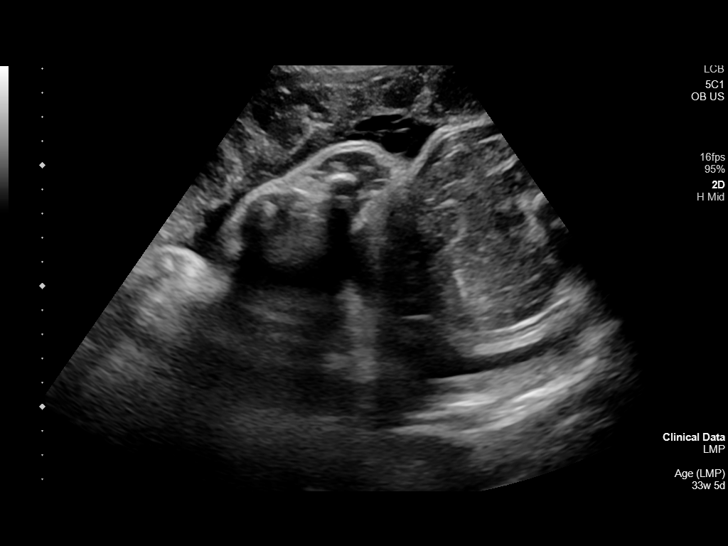
[im 20/56]
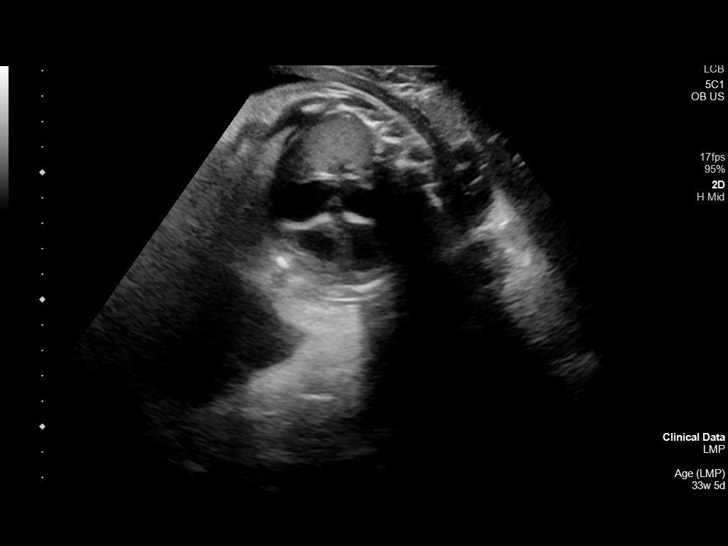
[im 24/56]
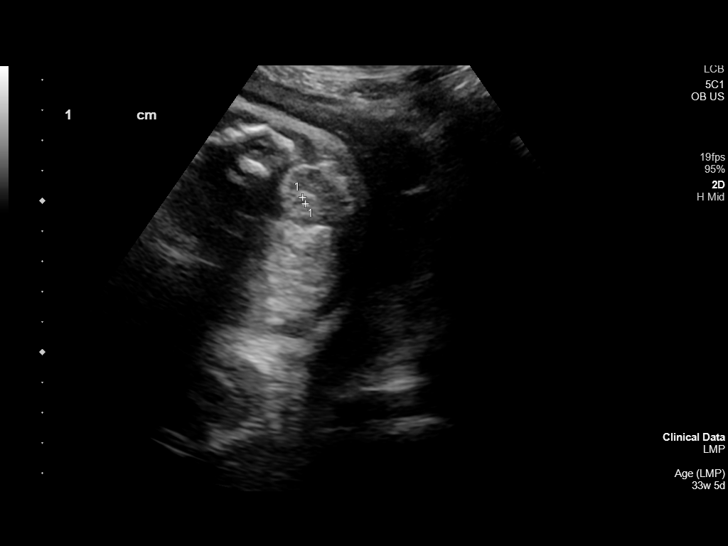
[im 30/56]
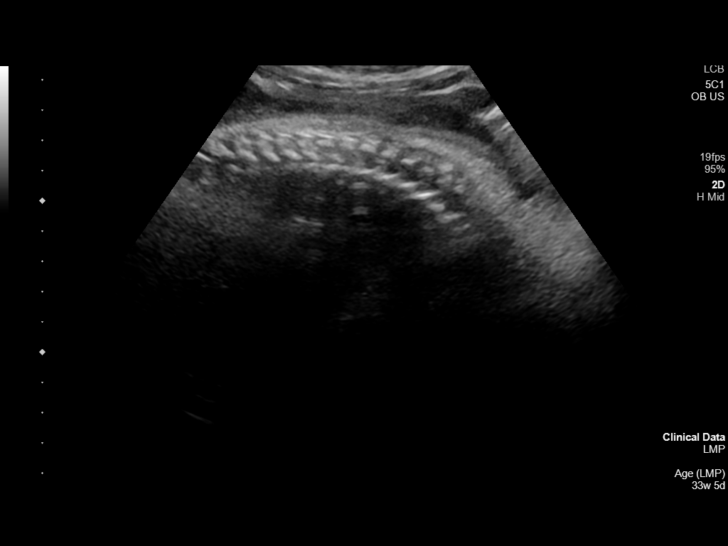
[im 34/56]
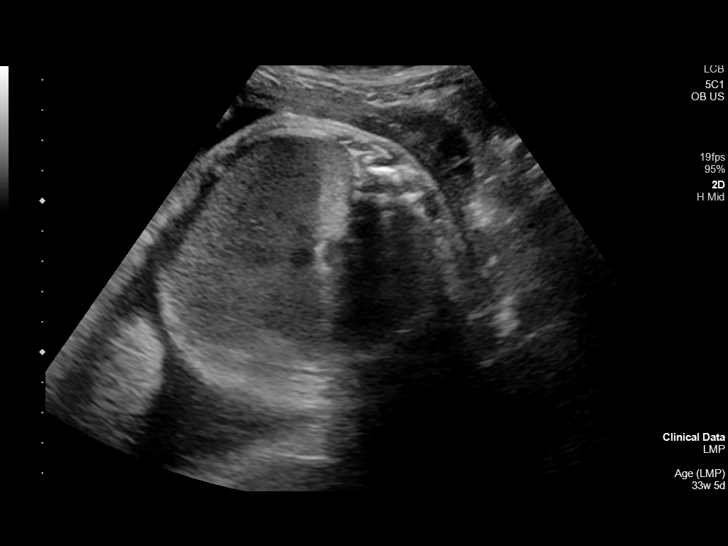
[im 39/56]
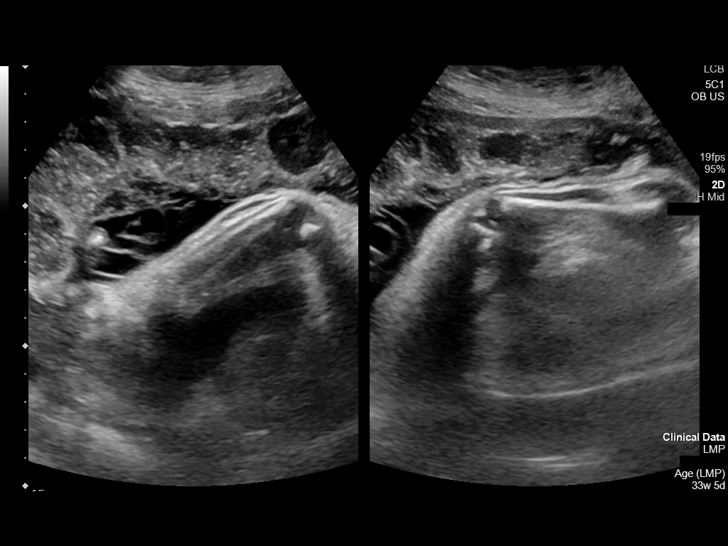
[im 43/56]
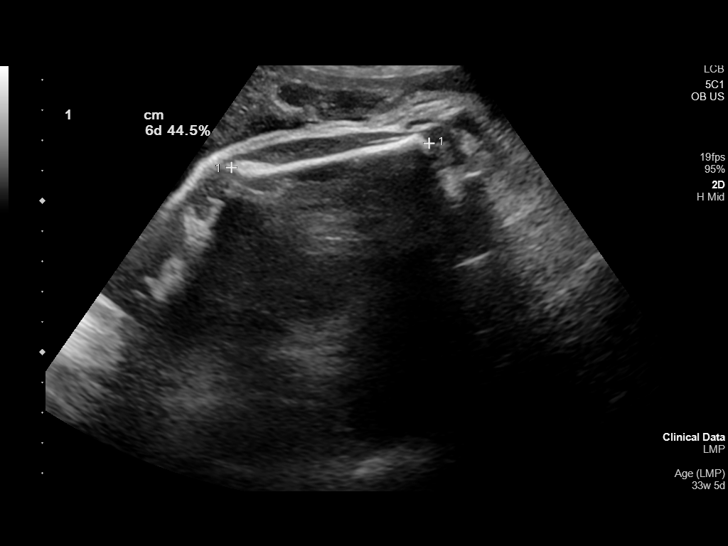
[im 47/56]
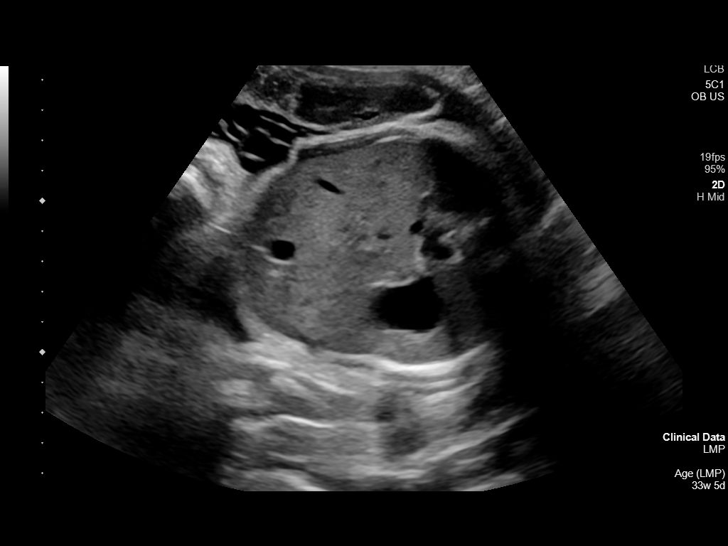
[im 51/56]
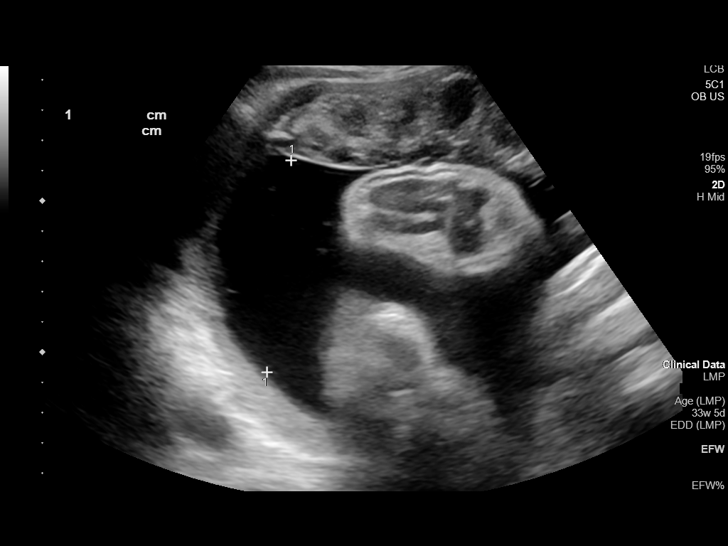
[im 56/56]
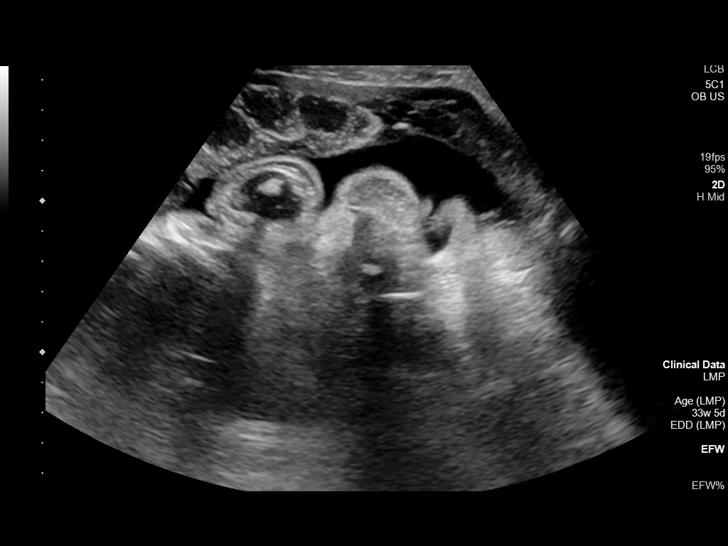

[13 of 28 positions shown; findings below may reference images not displayed]

FINDINGS: Number of Fetuses: 1

Heart Rate:  150 bpm

Movement: Yes

Presentation: Cephalic

Previa: No

Placental Location: Fundal and right lateral

Amniotic Fluid (Subjective): Within normal limits

Amniotic Fluid (Objective):

AFI = 11.8 cm (5%ile= 8.3 cm, 95%= 14.3 cm for 33 wks)

FETAL BIOMETRY

BPD: 8.4cm 33w 5d

HC:   30.9cm 34w 4d

AC:   29.7cm 33w 5d

FL:   6.6cm 34w 1d

Current Mean GA: 34w 0d US EDC: 09/22/2019

Assigned GA:  33w 5d Assigned EDC: 09/24/2019

Estimated Fetal Weight:  2,305g 49%ile

FETAL ANATOMY

Lateral Ventricles: Not visualized

Thalami/CSP: Appears normal

Posterior Fossa:  Not visualized

Nuchal Region: Not visualized   NFT= N/A > 20 WKS

Upper Lip: Appears normal

Spine: Limited views appear normal

4 Chamber Heart on Left: Appears normal

LVOT: Not visualized

RVOT: Not visualized

Stomach on Left: Appears normal

3 Vessel Cord: Not visualized

Cord Insertion site: Appears normal

Kidneys: Appears normal

Bladder: Appears normal

Extremities: Appears normal

Sex: Male

Technically difficult due to: Advanced gestational age and fetal
position

Maternal Findings:

Cervix:  Not visualized
IMPRESSION: Assigned GA currently 33 weeks 5 days. Appropriate fetal growth,
with EFW currently at 49 %ile.

Limited fetal anatomic evaluation due to advanced gestational age
and fetal position. Visualized fetal anatomy is unremarkable in
appearance.

Amniotic fluid volume within normal limits, with AFI of 11.8 cm.

## 2021-04-15 DEATH — deceased
